# Patient Record
Sex: Male | Born: 1975
Health system: Southern US, Community
[De-identification: ages and names within clinical notes are randomized; demographics above are authoritative.]

## PROBLEM LIST (undated history)

## (undated) DIAGNOSIS — I499 Cardiac arrhythmia, unspecified: Secondary | ICD-10-CM

## (undated) DIAGNOSIS — E079 Disorder of thyroid, unspecified: Secondary | ICD-10-CM

## (undated) DIAGNOSIS — M1990 Unspecified osteoarthritis, unspecified site: Secondary | ICD-10-CM

## (undated) DIAGNOSIS — E039 Hypothyroidism, unspecified: Secondary | ICD-10-CM

## (undated) DIAGNOSIS — I429 Cardiomyopathy, unspecified: Secondary | ICD-10-CM

## (undated) DIAGNOSIS — I483 Typical atrial flutter: Secondary | ICD-10-CM

## (undated) DIAGNOSIS — I4819 Other persistent atrial fibrillation: Secondary | ICD-10-CM

## (undated) HISTORY — DX: Typical atrial flutter: I48.3

## (undated) HISTORY — DX: Cardiomyopathy, unspecified: I42.9

## (undated) HISTORY — DX: Other persistent atrial fibrillation: I48.19

## (undated) HISTORY — PX: NO PAST SURGERIES: SHX2092

---

## 2012-07-21 ENCOUNTER — Encounter (HOSPITAL_COMMUNITY): Payer: Self-pay | Admitting: *Deleted

## 2012-07-21 ENCOUNTER — Emergency Department (HOSPITAL_COMMUNITY)
Admission: EM | Admit: 2012-07-21 | Discharge: 2012-07-21 | Disposition: A | Discharge status: Home / Self Care | Payer: 59 | Attending: Emergency Medicine | Admitting: Emergency Medicine

## 2012-07-21 DIAGNOSIS — Y939 Activity, unspecified: Secondary | ICD-10-CM | POA: Insufficient documentation

## 2012-07-21 DIAGNOSIS — S025XXA Fracture of tooth (traumatic), initial encounter for closed fracture: Secondary | ICD-10-CM | POA: Insufficient documentation

## 2012-07-21 DIAGNOSIS — Y929 Unspecified place or not applicable: Secondary | ICD-10-CM | POA: Insufficient documentation

## 2012-07-21 DIAGNOSIS — E079 Disorder of thyroid, unspecified: Secondary | ICD-10-CM | POA: Insufficient documentation

## 2012-07-21 DIAGNOSIS — Z79899 Other long term (current) drug therapy: Secondary | ICD-10-CM | POA: Insufficient documentation

## 2012-07-21 DIAGNOSIS — S0993XA Unspecified injury of face, initial encounter: Secondary | ICD-10-CM

## 2012-07-21 DIAGNOSIS — X58XXXA Exposure to other specified factors, initial encounter: Secondary | ICD-10-CM | POA: Insufficient documentation

## 2012-07-21 HISTORY — DX: Disorder of thyroid, unspecified: E07.9

## 2012-07-21 NOTE — ED Notes (Signed)
Pt broke right front tooth today and now having mild pain. Airway intact

## 2012-07-28 NOTE — ED Provider Notes (Signed)
History     CSN: [id number]  Arrival date & time 07/21/12  1250   First MD Initiated Contact with Patient 07/21/12 1301      Chief Complaint  Patient presents with  . Dental Injury    (Consider location/radiation/quality/duration/timing/severity/associated sxs/prior treatment) Patient is a 37 y.o. male presenting with dental injury. The history is provided by the patient. No language interpreter was used.  Dental Injury This is a new problem. The current episode started today. Associated symptoms comments: The patient presents with complaint of fractured tooth. After arrival, Dr. Dwain Sarna arrived to see patient. History and decision making was made between Dr. Dwain Sarna and the patient..    Past Medical History  Diagnosis Date  . Thyroid disease     History reviewed. No pertinent past surgical history.  History reviewed. No pertinent family history.  History  Substance Use Topics  . Smoking status: Not on file  . Smokeless tobacco: Not on file  . Alcohol Use: Yes     Comment: occ      Review of Systems  Unable to perform ROS   Allergies  Review of patient's allergies indicates no known allergies.  Home Medications   Current Outpatient Rx  Name  Route  Sig  Dispense  Refill  . levothyroxine (SYNTHROID, LEVOTHROID) 50 MCG tablet   Oral   Take 50 mcg by mouth daily.         [name] (SYSTANE OP)   Ophthalmic   Apply 3-4 drops to eye daily.           BP 116/64  Pulse 59  Temp(Src) 97.9 F (36.6 C) (Oral)  Resp 18  SpO2 97%  Physical Exam  Constitutional: He is oriented to person, place, and time. He appears well-developed and well-nourished. No distress.  Neurological: He is alert and oriented to person, place, and time.    ED Course  Procedures (including critical care time)  Labs Reviewed - No data to display No results found.   1. Dental injury       MDM  The patient was not examined by me. Per Dr.  Dwain Sarna the patient's treatment options are non-emergent dental care only and patient agreed and wishes to be discharged.         Arnoldo Hooker, PA-C 07/28/12 [phone]

## 2012-07-30 NOTE — ED Provider Notes (Signed)
Medical screening examination/treatment/procedure(s) were performed by non-physician practitioner and as supervising physician I was immediately available for consultation/collaboration.   Elliott L Wentz, MD 07/30/12 0153 

## 2014-12-29 ENCOUNTER — Other Ambulatory Visit (HOSPITAL_COMMUNITY)
Admission: RE | Admit: 2014-12-29 | Discharge: 2014-12-29 | Disposition: A | Discharge status: Home / Self Care | Payer: BLUE CROSS/BLUE SHIELD | Source: Ambulatory Visit | Attending: Endocrinology | Admitting: Endocrinology

## 2014-12-29 DIAGNOSIS — E038 Other specified hypothyroidism: Secondary | ICD-10-CM | POA: Diagnosis present

## 2014-12-29 LAB — T4, FREE: FREE T4: 1.01 ng/dL (ref 0.61–1.12)

## 2014-12-29 LAB — TSH: TSH: 1.498 u[IU]/mL (ref 0.350–4.500)

## 2015-09-01 ENCOUNTER — Encounter (HOSPITAL_COMMUNITY): Payer: Self-pay | Admitting: Family Medicine

## 2015-09-01 ENCOUNTER — Emergency Department (HOSPITAL_COMMUNITY): Payer: BLUE CROSS/BLUE SHIELD

## 2015-09-01 ENCOUNTER — Emergency Department (HOSPITAL_COMMUNITY)
Admission: EM | Admit: 2015-09-01 | Discharge: 2015-09-01 | Disposition: A | Discharge status: Home / Self Care | Payer: BLUE CROSS/BLUE SHIELD | Attending: Emergency Medicine | Admitting: Emergency Medicine

## 2015-09-01 DIAGNOSIS — E079 Disorder of thyroid, unspecified: Secondary | ICD-10-CM | POA: Insufficient documentation

## 2015-09-01 DIAGNOSIS — Z79899 Other long term (current) drug therapy: Secondary | ICD-10-CM | POA: Insufficient documentation

## 2015-09-01 DIAGNOSIS — I4891 Unspecified atrial fibrillation: Secondary | ICD-10-CM | POA: Diagnosis not present

## 2015-09-01 DIAGNOSIS — R079 Chest pain, unspecified: Secondary | ICD-10-CM | POA: Diagnosis present

## 2015-09-01 LAB — CBC
HEMATOCRIT: 41.8 % (ref 39.0–52.0)
Hemoglobin: 14.1 g/dL (ref 13.0–17.0)
MCH: 29.9 pg (ref 26.0–34.0)
MCHC: 33.7 g/dL (ref 30.0–36.0)
MCV: 88.7 fL (ref 78.0–100.0)
PLATELETS: 316 10*3/uL (ref 150–400)
RBC: 4.71 MIL/uL (ref 4.22–5.81)
RDW: 12.5 % (ref 11.5–15.5)
WBC: 8.7 10*3/uL (ref 4.0–10.5)

## 2015-09-01 LAB — BASIC METABOLIC PANEL
Anion gap: 11 (ref 5–15)
BUN: 16 mg/dL (ref 6–20)
CHLORIDE: 103 mmol/L (ref 101–111)
CO2: 23 mmol/L (ref 22–32)
CREATININE: 1 mg/dL (ref 0.61–1.24)
Calcium: 9.6 mg/dL (ref 8.9–10.3)
GFR calc Af Amer: >60 mL/min (ref >60)
GFR calc non Af Amer: >60 mL/min (ref >60)
GLUCOSE: 99 mg/dL (ref 65–99)
POTASSIUM: 4.4 mmol/L (ref 3.5–5.1)
Sodium: 137 mmol/L (ref 135–145)

## 2015-09-01 LAB — I-STAT TROPONIN, ED: Troponin i, poc: 0 ng/mL (ref 0.00–0.08)

## 2015-09-01 LAB — T4, FREE: Free T4: 0.91 ng/dL (ref 0.61–1.12)

## 2015-09-01 LAB — TSH: TSH: 2.221 u[IU]/mL (ref 0.350–4.500)

## 2015-09-01 NOTE — ED Provider Notes (Signed)
CSN: [id number]     Arrival date & time 09/01/15  1155 History   First MD Initiated Contact with Patient 09/01/15 1236     Chief Complaint  Patient presents with  . Chest Pain     Patient is a 40 y.o. male presenting with chest pain.  Chest Pain Associated symptoms: no abdominal pain, no back pain, no fatigue and no fever   Patient presented with chest pain. States it began last night around bedtime with this morning. It was dull in his mid chest. No radiation. No diaphoresis. No lightheadedness or dizziness. He states his weekend he did have a fair amount of alcohol while he was up in [place] for a wedding, he also smoked a couple cigarettes. No known hypertension or coronary artery disease. no cardiac history. States he has a slight pain now. He is on thyroid medicine. No swelling in his legs.  Past Medical History  Diagnosis Date  . Thyroid disease    History reviewed. No pertinent past surgical history. History reviewed. No pertinent family history. Social History  Substance Use Topics  . Smoking status: Never Smoker   . Smokeless tobacco: None  . Alcohol Use: Yes     Comment: occ    Review of Systems  Constitutional: Negative for fever, appetite change and fatigue.  Respiratory: Negative for chest tightness.   Cardiovascular: Positive for chest pain.  Gastrointestinal: Negative for abdominal pain.  Genitourinary: Negative for genital sores.  Musculoskeletal: Negative for back pain.      Allergies  Review of patient's allergies indicates no known allergies.  Home Medications   Prior to Admission medications   Medication Sig Start Date End Date Taking? Authorizing Provider  levothyroxine (SYNTHROID, LEVOTHROID) 50 MCG tablet Take 50 mcg by mouth daily.    Historical Provider, MD  Polyethyl Glycol-Propyl Glycol (SYSTANE OP) Apply 3-4 drops to eye daily.    Historical Provider, MD   BP 128/90 mmHg  Pulse 65  Temp(Src) 97.8 F (36.6 C) (Oral)  Resp 18  Ht 5\' 11"   (1.803 m)  Wt 185 lb (83.915 kg)  BMI 25.81 kg/m2  SpO2 100% Physical Exam  Constitutional: He appears well-developed.  HENT:  Head: Atraumatic.  Neck: Neck supple.  Cardiovascular:  Irregular rhythm  Pulmonary/Chest: Effort normal.  Abdominal: Soft. There is no tenderness.  Musculoskeletal: Normal range of motion. He exhibits no edema.  Neurological: He is alert.  Skin: Skin is warm.    ED Course  Procedures (including critical care time) Labs Review Labs Reviewed  BASIC METABOLIC PANEL  CBC  TSH  T4, FREE  T3, FREE  I-STAT TROPOININ, ED    Imaging Review Dg Chest 2 View  09/01/2015  CLINICAL DATA:  Chest pain since yesterday EXAM: CHEST  2 VIEW COMPARISON:  None. FINDINGS: The heart size and mediastinal contours are within normal limits. Both lungs are clear. The visualized skeletal structures are unremarkable. IMPRESSION: No active cardiopulmonary disease. Electronically Signed   By: Charlett Nose M.D.   On: 09/01/2015 12:21   I have personally reviewed and evaluated these images and lab results as part of my medical decision-making.   EKG Interpretation   Date/Time:  Wednesday September 01 2015 12:02:03 EDT Ventricular Rate:  114 PR Interval:    QRS Duration: 86 QT Interval:  340 QTC Calculation: 468 R Axis:   89 Text Interpretation:  Atrial fibrillation with rapid ventricular response  Abnormal ECG Confirmed by Rubin Payor  MD, Lyndee Herbst [phone]) on 09/01/2015  12:49:21 PM  MDM   Final diagnoses:  Atrial fibrillation, unspecified type [facility])    Patient with chest pain. EKG and story not really worsened for ischemic cause, however found to be in new onset atrial fibrillation. Rate controlled in the ER here. Does have thyroid problems which have been monitored by Dr Altheimer. Discussed with Dr. [place]. Cardioversion a possibility but patient may resolve spontaneously especially since this could be a holiday heart with the recent alcohol intake. After discussion the  patient and he will just follow-up with the atrial fibrillation clinic. No anticoagulation at this time with Chadsvasc of 0. Will return for fast heart rate.    Benjiman Core, MD 09/01/15 1355

## 2015-09-01 NOTE — Discharge Instructions (Signed)

## 2015-09-01 NOTE — ED Notes (Signed)
Pt here for intermittent episodes of left sided chest pain that gets worse and better. sts worse when just sitting. sts he has been under some stress. Denies any strenuous activity.

## 2015-09-02 LAB — T3, FREE: T3, Free: 2.5 pg/mL (ref 2.0–4.4)

## 2015-09-20 ENCOUNTER — Encounter (HOSPITAL_COMMUNITY): Payer: Self-pay | Admitting: Nurse Practitioner

## 2015-09-20 ENCOUNTER — Ambulatory Visit (HOSPITAL_COMMUNITY)
Admission: RE | Admit: 2015-09-20 | Discharge: 2015-09-20 | Disposition: A | Discharge status: Home / Self Care | Payer: BLUE CROSS/BLUE SHIELD | Source: Ambulatory Visit | Attending: Nurse Practitioner | Admitting: Nurse Practitioner

## 2015-09-20 VITALS — BP 138/86 | HR 70 | Ht 71.0 in | Wt 186.6 lb

## 2015-09-20 DIAGNOSIS — Z79899 Other long term (current) drug therapy: Secondary | ICD-10-CM | POA: Insufficient documentation

## 2015-09-20 DIAGNOSIS — I480 Paroxysmal atrial fibrillation: Secondary | ICD-10-CM

## 2015-09-20 DIAGNOSIS — E079 Disorder of thyroid, unspecified: Secondary | ICD-10-CM | POA: Diagnosis not present

## 2015-09-20 DIAGNOSIS — I4891 Unspecified atrial fibrillation: Secondary | ICD-10-CM | POA: Insufficient documentation

## 2015-09-20 NOTE — Progress Notes (Signed)
Patient ID: Walter [name], male   DOB: 1975-07-29, 40 y.o.   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 He had recently attended a wedding in [place], drank more alcohol than usual and smoked a few cigarettes. He could feel the irregular heart beat initially. It was thought maybe the afib was secondary to holiday heart and  he would convert on his own. The pt has felt well and has ran 4 miles several times without issues but was surprised to find that EKG shows Afib rate controlled at 70 bpm. He does not feel the irregular heart beat. Denies any unusual chest pain, shortness of breath or fatigue. Chadsvasc score in 0. He does not have a family history. No unusual caffeine intake. More than 2 alcohol drinks a week. Denies sleep apnea.  He does have thyroid issues but thyroid panel checked in ER was normal.  Today, he denies symptoms of palpitations, chest pain, shortness of breath, orthopnea, PND, lower extremity edema, dizziness, presyncope, syncope, or neurologic sequela. The patient is tolerating medications without difficulties and is otherwise without complaint today.   Past Medical History  Diagnosis Date  . Thyroid disease    No past surgical history on file.  Current Outpatient Prescriptions  Medication Sig Dispense Refill  . levothyroxine (SYNTHROID, LEVOTHROID) 50 MCG tablet Take 50 mcg by mouth daily.    [name] (SYSTANE OP) Apply 3-4 drops to eye daily.     No current facility-administered medications for this encounter.    No Known Allergies  Social History   Social History  . Marital Status: Married    Spouse Name: N/A  . Number of Children: N/A  . Years of Education: N/A   Occupational History  . Not on file.   Social History Main Topics  . Smoking status: Never Smoker   .  Smokeless tobacco: Not on file  . Alcohol Use: Yes     Comment: occ  . Drug Use: No  . Sexual Activity: Not on file   Other Topics Concern  . Not on file   Social History Narrative    No family history on file.  ROS- All systems are reviewed and negative except as per the HPI above  Physical Exam: Filed Vitals:   09/20/15 0844  BP: 138/86  Pulse: 70  Height:  (1.803 m)  Weight: 186 lb 9.6 oz (84.641 kg)    GEN- The patient is well appearing, alert and oriented x 3 today.   Head- normocephalic, atraumatic Eyes-  Sclera clear, conjunctiva pink Ears- hearing intact Oropharynx- clear Neck- supple, no JVP Lymph- no cervical lymphadenopathy Lungs- Clear to ausculation bilaterally, normal work of breathing Heart-Irregular rate and rhythm, no murmurs, rubs or gallops, PMI not laterally displaced GI- soft, NT, ND, + BS Extremities- no clubbing, cyanosis, or edema MS- no significant deformity or atrophy Skin- no rash or lesion Psych- euthymic mood, full affect Neuro- strength and sensation are intact  EKG-afib at 70 bpm, qrs int 88 ms, qtc 423 ms with evidence for LVH  Epic records reviewed  Assessment and Plan: 1. Afib, asymptomatic Hard to call where it has been persistent or paroxymal  since 4/5   He was encouraged to buy a blood pressure cuff to see if persistent. If  persistent then will require to start a blood thinner with plans for cardioversion But first will plan for an echo and pt will contacted by phone results and necessary f/u. Chadsvasc score of 0  2. Lifestyle modification Limit alcohol to no more than 2 drinks a  week Currently is active and normal weight No symptoms of sleep apnea  F/u once echo results are known.

## 2015-09-24 ENCOUNTER — Ambulatory Visit (HOSPITAL_COMMUNITY)
Admission: RE | Admit: 2015-09-24 | Discharge: 2015-09-24 | Disposition: A | Discharge status: Home / Self Care | Payer: BLUE CROSS/BLUE SHIELD | Source: Ambulatory Visit | Attending: Nurse Practitioner | Admitting: Nurse Practitioner

## 2015-09-24 DIAGNOSIS — I4891 Unspecified atrial fibrillation: Secondary | ICD-10-CM | POA: Diagnosis present

## 2015-09-24 DIAGNOSIS — I517 Cardiomegaly: Secondary | ICD-10-CM | POA: Diagnosis not present

## 2015-09-24 DIAGNOSIS — I480 Paroxysmal atrial fibrillation: Secondary | ICD-10-CM | POA: Diagnosis not present

## 2015-09-24 NOTE — Progress Notes (Signed)
  Echocardiogram 2D Echocardiogram has been performed.  Walter [name] 09/24/2015, 3:56 PM

## 2015-09-28 ENCOUNTER — Telehealth (HOSPITAL_COMMUNITY): Payer: Self-pay | Admitting: Nurse Practitioner

## 2015-09-28 NOTE — Telephone Encounter (Signed)
Called pt to let him know of results of echo. He states that he feels great and does not believe he is in afib. I asked him to come by the office for EKG to confirm because if he still is in afib then really need to anticoagulate and cardiovert to restore SR. He assured me that he would do this soon. Echo suggests possible sleep apnea, he states that he does  snore but wife said no witnessed apnea episodes, but I suggested it still might be good to get a sleep study due to high correlation of afib and sleep apnea.

## 2015-09-29 ENCOUNTER — Ambulatory Visit (HOSPITAL_COMMUNITY)
Admission: RE | Admit: 2015-09-29 | Discharge: 2015-09-29 | Disposition: A | Discharge status: Home / Self Care | Payer: BLUE CROSS/BLUE SHIELD | Source: Ambulatory Visit | Attending: Nurse Practitioner | Admitting: Nurse Practitioner

## 2015-09-29 DIAGNOSIS — R001 Bradycardia, unspecified: Secondary | ICD-10-CM | POA: Diagnosis not present

## 2015-09-29 DIAGNOSIS — I4891 Unspecified atrial fibrillation: Secondary | ICD-10-CM | POA: Insufficient documentation

## 2015-09-29 DIAGNOSIS — R0683 Snoring: Secondary | ICD-10-CM | POA: Insufficient documentation

## 2015-09-29 DIAGNOSIS — I480 Paroxysmal atrial fibrillation: Secondary | ICD-10-CM

## 2015-09-29 NOTE — Progress Notes (Addendum)
Patient in for repeat EKG. Walter Coco NP to review EKG  Pt is in SR by today's EKG and states that he feels great. We discussed obtaining aq sleep study for snoring and mild structure changes and he will let me know if he wants to proceed. Offered 30 mg Cardizem as needed if he feels that he is in afib and he passes on it currently. Offered services of clinic at any time he feels that he is out of rhythm. He feels very well at present.

## 2016-09-05 DIAGNOSIS — R030 Elevated blood-pressure reading, without diagnosis of hypertension: Secondary | ICD-10-CM | POA: Diagnosis not present

## 2016-09-05 DIAGNOSIS — E039 Hypothyroidism, unspecified: Secondary | ICD-10-CM | POA: Diagnosis not present

## 2016-09-05 DIAGNOSIS — E063 Autoimmune thyroiditis: Secondary | ICD-10-CM | POA: Diagnosis not present

## 2016-09-05 DIAGNOSIS — E038 Other specified hypothyroidism: Secondary | ICD-10-CM | POA: Diagnosis not present

## 2016-09-05 DIAGNOSIS — E7800 Pure hypercholesterolemia, unspecified: Secondary | ICD-10-CM | POA: Diagnosis not present

## 2017-02-12 DIAGNOSIS — F4325 Adjustment disorder with mixed disturbance of emotions and conduct: Secondary | ICD-10-CM | POA: Diagnosis not present

## 2017-08-20 DIAGNOSIS — E038 Other specified hypothyroidism: Secondary | ICD-10-CM | POA: Diagnosis not present

## 2017-08-20 DIAGNOSIS — E063 Autoimmune thyroiditis: Secondary | ICD-10-CM | POA: Diagnosis not present

## 2017-08-20 DIAGNOSIS — R030 Elevated blood-pressure reading, without diagnosis of hypertension: Secondary | ICD-10-CM | POA: Diagnosis not present

## 2017-08-20 DIAGNOSIS — E7800 Pure hypercholesterolemia, unspecified: Secondary | ICD-10-CM | POA: Diagnosis not present

## 2017-08-21 ENCOUNTER — Telehealth: Payer: Self-pay

## 2017-08-21 NOTE — Telephone Encounter (Signed)
SENT REFERRAL TO SCHEDULING 

## 2017-08-22 ENCOUNTER — Telehealth: Payer: Self-pay | Admitting: Cardiology

## 2017-08-22 NOTE — Telephone Encounter (Signed)
error 

## 2017-08-22 NOTE — Telephone Encounter (Signed)
The first AM appt I see is not until 5/24 at 8 am.  Will ask Dr Anne Fu where he would like to schedule the patient.

## 2017-08-22 NOTE — Telephone Encounter (Signed)
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

## 2017-08-23 NOTE — Telephone Encounter (Signed)
Friday April 5, 8am double book ok.  Thanks  Donato Schultz, MD

## 2017-08-23 NOTE — Telephone Encounter (Signed)
Spoke with pt and scheduled him to see Dr. Anne Fu 4/5.  Pt in agreement with this plan.

## 2017-08-31 ENCOUNTER — Ambulatory Visit (INDEPENDENT_AMBULATORY_CARE_PROVIDER_SITE_OTHER): Payer: BLUE CROSS/BLUE SHIELD | Admitting: Cardiology

## 2017-08-31 ENCOUNTER — Encounter: Payer: Self-pay | Admitting: Cardiology

## 2017-08-31 ENCOUNTER — Encounter (INDEPENDENT_AMBULATORY_CARE_PROVIDER_SITE_OTHER): Payer: Self-pay

## 2017-08-31 VITALS — BP 138/80 | HR 120 | Ht 71.0 in | Wt 181.6 lb

## 2017-08-31 DIAGNOSIS — I480 Paroxysmal atrial fibrillation: Secondary | ICD-10-CM | POA: Diagnosis not present

## 2017-08-31 DIAGNOSIS — R000 Tachycardia, unspecified: Secondary | ICD-10-CM | POA: Diagnosis not present

## 2017-08-31 MED ORDER — DILTIAZEM HCL ER COATED BEADS 120 MG PO CP24: 120.0000 mg | ORAL_CAPSULE | Freq: Every day | ORAL | 6 refills | Status: DC

## 2017-08-31 NOTE — Progress Notes (Signed)
Cardiology Office Note:    Date:  08/31/2017   ID:  Jarmal Lewelling, DOB 1975/09/07, MRN [id number]  PCP:  Default, Provider, MD  Cardiologist:  No primary care provider on file.   Referring MD: Altheimer, Casimiro Needle, MD     History of Present Illness:    Walter [name] is a 42 y.o. male here for the evaluation of paroxysmal atrial fibrillation and hypertension at the request of Dr. Leslie Dales.  Back in 08/2015 had episode of atrial fibrillation after attending a wedding in [place]. Came back home, ran, and was surprised to find AFIB on ECG.  Converted on own back in 2017.  He is here again today with relatively no symptoms but his EKG is demonstrating atrial fibrillation with heart rates ranging between 110 and 120 bpm.  He thinks perhaps his endurance when running has decreased.  Perhaps these are his symptoms.  Overall he denies any chest pain fevers chills nausea vomiting syncope shortness of breath.  No bleeding issues.  CHADSVASC - 0  Has thyroid condition on synthroid controlled.   Denies snoring.    Past Medical History:  Diagnosis Date  . Thyroid disease     No past surgical history on file.  Current Medications: Current Meds  Medication Sig  . aspirin EC 81 MG tablet Take 1 tablet once a day  . levothyroxine (SYNTHROID) 112 MCG tablet Take 1 tablet by mouth once day in the AM on an empty stomach for thyroid (name brand only DAW)  . Multiple Vitamin (MULTIVITAMIN) capsule Take by mouth.  [name] (SYSTANE OP) Apply 3-4 drops to eye daily.     Allergies:   Patient has no known allergies.   Social History   Socioeconomic History  . Marital status: Married    Spouse name: Not on file  . Number of children: Not on file  . Years of education: Not on file  . Highest education level: Not on file  Occupational History  . Not on file  Social Needs  . Financial resource strain: Not on file  . Food insecurity:    Worry: Not on file    Inability: Not  on file  . Transportation needs:    Medical: Not on file    Non-medical: Not on file  Tobacco Use  . Smoking status: Never Smoker  Substance and Sexual Activity  . Alcohol use: Yes    Comment: occ  . Drug use: No  . Sexual activity: Not on file  Lifestyle  . Physical activity:    Days per week: Not on file    Minutes per session: Not on file  . Stress: Not on file  Relationships  . Social connections:    Talks on phone: Not on file    Gets together: Not on file    Attends religious service: Not on file    Active member of club or organization: Not on file    Attends meetings of clubs or organizations: Not on file    Relationship status: Not on file  Other Topics Concern  . Not on file  Social History Narrative  . Not on file     Family History: No early family history of CAD  ROS:   Please see the history of present illness.     All other systems reviewed and are negative.  EKGs/Labs/Other Studies Reviewed:    The following studies were reviewed today:  ECHO: 09/24/15 - Left ventricle: Wall thickness was increased in a pattern of  mild   LVH. Systolic function was normal. The estimated ejection   fraction was in the range of 50% to 55%. - Left atrium: The atrium was mildly dilated. - Right atrium: The atrium was moderately dilated.  EKG:  EKG is ordered today.  The ekg ordered today demonstrates 08/31/17-atrial fibrillation heart rate 120 bpm, LVH borderline.  Personally viewed.  In 09/20/15 - AFIB rate 70, borderline LVH.   Recent Labs: No results found for requested labs within last 8760 hours.  Recent Lipid Panel No results found for: CHOL, TRIG, HDL, CHOLHDL, VLDL, LDLCALC, LDLDIRECT     Physical Exam:    VS:  BP 138/80 (BP Location: Left Arm, Patient Position: Sitting, Cuff Size: Normal)   Pulse (!) 120   Ht 5\' 11"  (1.803 m)   Wt 181 lb 9.6 oz (82.4 kg)   BMI 25.33 kg/m     Wt Readings from Last 3 Encounters:  08/31/17 181 lb 9.6 oz (82.4 kg)    09/20/15 186 lb 9.6 oz (84.6 kg)  09/01/15 185 lb (83.9 kg)     GEN:  Well nourished, well developed in no acute distress HEENT: Normal NECK: No JVD; No carotid bruits LYMPHATICS: No lymphadenopathy CARDIAC: irreg irreg mildly tachy, no murmurs, rubs, gallops RESPIRATORY:  Clear to auscultation without rales, wheezing or rhonchi  ABDOMEN: Soft, non-tender, non-distended MUSCULOSKELETAL:  No edema; No deformity  SKIN: Warm and dry NEUROLOGIC:  Alert and oriented x 3 PSYCHIATRIC:  Normal affect   ASSESSMENT:    1. Increased heart rate   2. Paroxysmal atrial fibrillation (HCC)    PLAN:    In order of problems listed above:  Paroxysmal atrial fibrillation -Back in 2017 he auto converted.  Today he is in atrial fibrillation and really did not realize he was in it.  Perhaps decreased exercise tolerance noted when running. -We will start diltiazem CD 120 mill grams once a day.  Not only with his hopefully help with rate control but may help him auto convert.  We will see him back in the next week or so for follow-up.  If he is still in atrial fibrillation then, we have discussed starting Xarelto for 3 weeks cardioversion then continuing Xarelto for 4 weeks after cardioversion.  He understands.  I think given his age and mild decreased exercise tolerance, I would like to see him out of atrial fibrillation and try to maintain rhythm control.  Next steps may also be antiarrhythmics.  I once again expressed that we have the ability to utilize our atrial fibrillation clinic if necessary as well.  He has seen Rudi Coco, NP in the past. -He understands to use alcohol with moderation.  Make sure his thyroid is under good control. -I explained to him that aspirin therapy in the setting of atrial fibrillation has not been shown to be of significant benefit. CHADSVASC - 0    Medication Adjustments/Labs and Tests Ordered: Current medicines are reviewed at length with the patient today.   Concerns regarding medicines are outlined above.  Orders Placed This Encounter  Procedures  . EKG 12-Lead   Meds ordered this encounter  Medications  . diltiazem (CARDIZEM CD) 120 MG 24 hr capsule    Sig: Take 1 capsule (120 mg total) by mouth daily.    Dispense:  30 capsule    Refill:  6    Signed, Donato Schultz, MD  08/31/2017 10:24 AM    [facility]

## 2017-08-31 NOTE — Patient Instructions (Signed)
Medication Instructions:  Your physician has recommended you make the following change in your medication:  1. Start Cardizem CD  (120 mg ) daily   Labwork: -None  Testing/Procedures: -None   Follow-Up: Your physician recommends that you keep your scheduled  follow-up appointment with Dr. Anne Fu.    Any Other Special Instructions Will Be Listed Below (If Applicable).     If you need a refill on your cardiac medications before your next appointment, please call your pharmacy.

## 2017-09-10 ENCOUNTER — Encounter: Payer: Self-pay | Admitting: Cardiology

## 2017-09-10 ENCOUNTER — Ambulatory Visit (INDEPENDENT_AMBULATORY_CARE_PROVIDER_SITE_OTHER): Payer: BLUE CROSS/BLUE SHIELD | Admitting: Cardiology

## 2017-09-10 VITALS — BP 134/86 | HR 88 | Ht 71.0 in | Wt 179.8 lb

## 2017-09-10 DIAGNOSIS — I480 Paroxysmal atrial fibrillation: Secondary | ICD-10-CM | POA: Diagnosis not present

## 2017-09-10 MED ORDER — RIVAROXABAN 20 MG PO TABS: 20.0000 mg | ORAL_TABLET | Freq: Every day | ORAL | 11 refills | Status: DC

## 2017-09-10 MED ORDER — FLECAINIDE ACETATE 50 MG PO TABS: 50.0000 mg | ORAL_TABLET | Freq: Two times a day (BID) | ORAL | 6 refills | Status: DC

## 2017-09-10 MED ORDER — DILTIAZEM HCL ER COATED BEADS 240 MG PO CP24: 240.0000 mg | ORAL_CAPSULE | Freq: Every day | ORAL | 11 refills | Status: DC

## 2017-09-10 NOTE — Patient Instructions (Signed)
Medication Instructions:  Please discontinue your ASA. Increase Diltiazem to 240 mg a day. Start Xarelto 20 mg a day. Start Flecainide 50 mg twice a day. continue all other medications as listed.  Follow-Up: Follow up in 1 week with Rudi Coco in the [facility].  If you need a refill on your cardiac medications before your next appointment, please call your pharmacy.  Thank you for choosing [facility]!!

## 2017-09-10 NOTE — Progress Notes (Signed)
Cardiology Office Note:    Date:  09/10/2017   ID:  [name], DOB [birth year], MRN [id number]  PCP:  Default, Provider, MD  Cardiologist:  No primary care provider on file.   Referring MD: No ref. provider found     History of Present Illness:    Walter [name] is a 41 y.o. male here for the evaluation of paroxysmal atrial fibrillation and hypertension at the request of Dr. Leslie Dales.  Back in 08/2015 had episode of atrial fibrillation after attending a wedding in [place]. Came back home, ran, and was surprised to find AFIB on ECG.  Converted on own back in 2017.  He is here again today with relatively no symptoms but his EKG is demonstrating atrial fibrillation with heart rates ranging between 110 and 120 bpm.  He thinks perhaps his endurance when running has decreased.  Perhaps these are his symptoms.  Overall he denies any chest pain fevers chills nausea vomiting syncope shortness of breath.  No bleeding issues.  CHADSVASC - 0  Has thyroid condition on synthroid controlled.   Denies snoring.   09/10/17-he still remains in atrial fibrillation although rate is slightly improved around 100 today with diltiazem 120 CD.  We discussed the next steps and he would like to see if he could possibly convert chemically without cardioversion.  We will start flecainide as below.  Has had no history of coronary artery disease.  Past Medical History:  Diagnosis Date  . Thyroid disease     History reviewed. No pertinent surgical history.  Current Medications: Current Meds  Medication Sig  . diltiazem (CARDIZEM CD) 240 MG 24 hr capsule Take 1 capsule (240 mg total) by mouth daily.  Marland Kitchen levothyroxine (SYNTHROID) 112 MCG tablet Take 1 tablet by mouth once day in the AM on an empty stomach for thyroid (name brand only DAW)  . Multiple Vitamin (MULTIVITAMIN) capsule Take by mouth.  [name] (SYSTANE OP) Apply 3-4 drops to eye daily.  . [DISCONTINUED] aspirin EC 81 MG tablet  Take 1 tablet once a day  . [DISCONTINUED] diltiazem (CARDIZEM CD) 120 MG 24 hr capsule Take 1 capsule (120 mg total) by mouth daily.     Allergies:   Patient has no known allergies.   Social History   Socioeconomic History  . Marital status: Married    Spouse name: Not on file  . Number of children: Not on file  . Years of education: Not on file  . Highest education level: Not on file  Occupational History  . Not on file  Social Needs  . Financial resource strain: Not on file  . Food insecurity:    Worry: Not on file    Inability: Not on file  . Transportation needs:    Medical: Not on file    Non-medical: Not on file  Tobacco Use  . Smoking status: Never Smoker  . Smokeless tobacco: Never Used  Substance and Sexual Activity  . Alcohol use: Yes    Comment: occ  . Drug use: No  . Sexual activity: Not on file  Lifestyle  . Physical activity:    Days per week: Not on file    Minutes per session: Not on file  . Stress: Not on file  Relationships  . Social connections:    Talks on phone: Not on file    Gets together: Not on file    Attends religious service: Not on file    Active member of club or organization: Not  on file    Attends meetings of clubs or organizations: Not on file    Relationship status: Not on file  Other Topics Concern  . Not on file  Social History Narrative  . Not on file     Family History: No early family history of CAD  ROS:   Please see the history of present illness.     All other systems reviewed and are negative.  EKGs/Labs/Other Studies Reviewed:    The following studies were reviewed today:  ECHO: 09/24/15 - Left ventricle: Wall thickness was increased in a pattern of mild   LVH. Systolic function was normal. The estimated ejection   fraction was in the range of 50% to 55%. - Left atrium: The atrium was mildly dilated. - Right atrium: The atrium was moderately dilated.  EKG:  08/31/17-atrial fibrillation heart rate 120 bpm,  LVH borderline.  Personally viewed.  In 09/20/15 - AFIB rate 70, borderline LVH.   Recent Labs: No results found for requested labs within last 8760 hours.  Recent Lipid Panel No results found for: CHOL, TRIG, HDL, CHOLHDL, VLDL, LDLCALC, LDLDIRECT     Physical Exam:    VS:  BP 134/86   Pulse 88   Ht 5\' 11"  (1.803 m)   Wt 179 lb 12.8 oz (81.6 kg)   BMI 25.08 kg/m     Wt Readings from Last 3 Encounters:  09/10/17 179 lb 12.8 oz (81.6 kg)  08/31/17 181 lb 9.6 oz (82.4 kg)  09/20/15 186 lb 9.6 oz (84.6 kg)    GEN: Well nourished, well developed, in no acute distress  HEENT: normal  Neck: no JVD, carotid bruits, or masses Cardiac: irreg improved rate.  no murmurs, rubs, or gallops,no edema  Respiratory:  clear to auscultation bilaterally, normal work of breathing GI: soft, nontender, nondistended, + BS MS: no deformity or atrophy  Skin: warm and dry, no rash Neuro:  Alert and Oriented x 3, Strength and sensation are intact Psych: euthymic mood, full affect   ASSESSMENT:    1. Paroxysmal atrial fibrillation (HCC)    PLAN:    In order of problems listed above:  Paroxysmal atrial fibrillation -Back in 2017 he auto converted.  Now, he is continuing to show atrial fibrillation despite diltiazem 120 mg CD.  We will increase the diltiazem to 240 and I will add flecainide 50 mg twice a day.  We are starting Xarelto 20 mg once a day.  I will have him follow-up next week in the atrial fibrillation clinic with Rudi Coco.  If he is still in atrial fibrillation, we will increase his flecainide to 100 mg twice a day.  Also, we will arrange for cardioversion then for 3 weeks after 09/10/17.  If I am in the hospital, I will be happy to perform the cardioversion.  At our prior visit, he really did not realize he was in atrial fibrillation with rapid ventricular response in the 120s.  Today, he thinks that earlier this morning at a meeting he began to feel some palpitations.   Xarelto for  3 weeks prior to cardioversion is been started then continuing Xarelto for 4 weeks after cardioversion.  He understands.  I think given his age and mild decreased exercise tolerance, I would like to see him out of atrial fibrillation and try to maintain rhythm control.   He has seen Rudi Coco, NP in the past. -He understands to use alcohol with moderation.  Make sure his thyroid is under good control.  CHADSVASC - 0    Medication Adjustments/Labs and Tests Ordered: Current medicines are reviewed at length with the patient today.  Concerns regarding medicines are outlined above.  No orders of the defined types were placed in this encounter.  Meds ordered this encounter  Medications  . diltiazem (CARDIZEM CD) 240 MG 24 hr capsule    Sig: Take 1 capsule (240 mg total) by mouth daily.    Dispense:  30 capsule    Refill:  11  . rivaroxaban (XARELTO) 20 MG TABS tablet    Sig: Take 1 tablet (20 mg total) by mouth daily with supper.    Dispense:  30 tablet    Refill:  11  . flecainide (TAMBOCOR) 50 MG tablet    Sig: Take 1 tablet (50 mg total) by mouth 2 (two) times daily.    Dispense:  60 tablet    Refill:  6    Signed, Walter Schultz, MD  09/10/2017 4:52 PM    [facility]

## 2017-09-20 ENCOUNTER — Encounter (HOSPITAL_COMMUNITY): Payer: Self-pay | Admitting: Nurse Practitioner

## 2017-09-20 ENCOUNTER — Ambulatory Visit (HOSPITAL_COMMUNITY)
Admission: RE | Admit: 2017-09-20 | Discharge: 2017-09-20 | Disposition: A | Discharge status: Home / Self Care | Payer: BLUE CROSS/BLUE SHIELD | Source: Ambulatory Visit | Attending: Nurse Practitioner | Admitting: Nurse Practitioner

## 2017-09-20 ENCOUNTER — Ambulatory Visit (HOSPITAL_COMMUNITY): Payer: BLUE CROSS/BLUE SHIELD | Admitting: Nurse Practitioner

## 2017-09-20 VITALS — BP 134/86 | HR 141 | Ht 71.0 in | Wt 181.2 lb

## 2017-09-20 DIAGNOSIS — I4819 Other persistent atrial fibrillation: Secondary | ICD-10-CM

## 2017-09-20 DIAGNOSIS — Z7901 Long term (current) use of anticoagulants: Secondary | ICD-10-CM | POA: Diagnosis not present

## 2017-09-20 DIAGNOSIS — I481 Persistent atrial fibrillation: Secondary | ICD-10-CM | POA: Diagnosis not present

## 2017-09-20 DIAGNOSIS — I4892 Unspecified atrial flutter: Secondary | ICD-10-CM | POA: Insufficient documentation

## 2017-09-20 DIAGNOSIS — Z79899 Other long term (current) drug therapy: Secondary | ICD-10-CM | POA: Diagnosis not present

## 2017-09-20 DIAGNOSIS — I4891 Unspecified atrial fibrillation: Secondary | ICD-10-CM | POA: Diagnosis not present

## 2017-09-20 MED ORDER — METOPROLOL SUCCINATE ER 25 MG PO TB24: 25.0000 mg | ORAL_TABLET | Freq: Every day | ORAL | 3 refills | Status: DC

## 2017-09-20 MED ORDER — FLECAINIDE ACETATE 50 MG PO TABS: 100.0000 mg | ORAL_TABLET | Freq: Two times a day (BID) | ORAL | 6 refills | Status: DC

## 2017-09-20 NOTE — Progress Notes (Signed)
Primary Care Physician: Default, Provider, MD Referring Physician: Dr. [name] is a 42 y.o. male with a h/o paroxysmal afib. I saw him in April of 2017, for afib that started while he ws in a wedding in [place] but he self converted. He recently developed afib again and was evaluated by Dr. Anne Fu. He was started on xarelto 20 mg daily, Cardizem 120 mg a day and flecainide 50 mg bid. Was asked to  f/u in the clinic to adjust meds and ultimately arrange for cardioversion. He is asymptomatic but is running 140 bpm.  He has been on anticoagulation since 4/15.  Today, he denies symptoms of palpitations, chest pain, shortness of breath, orthopnea, PND, lower extremity edema, dizziness, presyncope, syncope, or neurologic sequela. The patient is tolerating medications without difficulties and is otherwise without complaint today.   Past Medical History:  Diagnosis Date  . Thyroid disease    No past surgical history on file.  Current Outpatient Medications  Medication Sig Dispense Refill  . diltiazem (CARDIZEM CD) 240 MG 24 hr capsule Take 1 capsule (240 mg total) by mouth daily. 30 capsule 11  . flecainide (TAMBOCOR) 50 MG tablet Take 2 tablets (100 mg total) by mouth 2 (two) times daily. 60 tablet 6  . levothyroxine (SYNTHROID) 112 MCG tablet Take 1 tablet by mouth once day in the AM on an empty stomach for thyroid (name brand only DAW)    . Multiple Vitamin (MULTIVITAMIN) capsule Take by mouth.    [name] (SYSTANE OP) Apply 3-4 drops to eye daily.    . rivaroxaban (XARELTO) 20 MG TABS tablet Take 1 tablet (20 mg total) by mouth daily with supper. 30 tablet 11  . metoprolol succinate (TOPROL XL) 25 MG 24 hr tablet Take 1 tablet (25 mg total) by mouth at bedtime. 30 tablet 3   No current facility-administered medications for this encounter.     No Known Allergies  Social History   Socioeconomic History  . Marital status: Married    Spouse name: Not  on file  . Number of children: Not on file  . Years of education: Not on file  . Highest education level: Not on file  Occupational History  . Not on file  Social Needs  . Financial resource strain: Not on file  . Food insecurity:    Worry: Not on file    Inability: Not on file  . Transportation needs:    Medical: Not on file    Non-medical: Not on file  Tobacco Use  . Smoking status: Never Smoker  . Smokeless tobacco: Never Used  Substance and Sexual Activity  . Alcohol use: Yes    Comment: occ  . Drug use: No  . Sexual activity: Not on file  Lifestyle  . Physical activity:    Days per week: Not on file    Minutes per session: Not on file  . Stress: Not on file  Relationships  . Social connections:    Talks on phone: Not on file    Gets together: Not on file    Attends religious service: Not on file    Active member of club or organization: Not on file    Attends meetings of clubs or organizations: Not on file    Relationship status: Not on file  . Intimate partner violence:    Fear of current or ex partner: Not on file    Emotionally abused: Not on file  Physically abused: Not on file    Forced sexual activity: Not on file  Other Topics Concern  . Not on file  Social History Narrative  . Not on file    No family history on file.  ROS- All systems are reviewed and negative except as per the HPI above  Physical Exam: Vitals:   09/20/17 0931  BP: 134/86  Pulse: (!) 141  Weight: 181 lb 3.2 oz (82.2 kg)  Height: 5\' 11"  (1.803 m)   Wt Readings from Last 3 Encounters:  09/20/17 181 lb 3.2 oz (82.2 kg)  09/10/17 179 lb 12.8 oz (81.6 kg)  08/31/17 181 lb 9.6 oz (82.4 kg)    Labs: Lab Results  Component Value Date   NA 137 09/01/2015   K 4.4 09/01/2015   CL 103 09/01/2015   CO2 23 09/01/2015   GLUCOSE 99 09/01/2015   BUN 16 09/01/2015   CREATININE 1.00 09/01/2015   CALCIUM 9.6 09/01/2015   No results found for: INR No results found for: CHOL,  HDL, LDLCALC, TRIG   GEN- The patient is well appearing, alert and oriented x 3 today.   Head- normocephalic, atraumatic Eyes-  Sclera clear, conjunctiva pink Ears- hearing intact Oropharynx- clear Neck- supple, no JVP Lymph- no cervical lymphadenopathy Lungs- Clear to ausculation bilaterally, normal work of breathing Heart- Rapid regular rate and rhythm, no murmurs, rubs or gallops, PMI not laterally displaced GI- soft, NT, ND, + BS Extremities- no clubbing, cyanosis, or edema MS- no significant deformity or atrophy Skin- no rash or lesion Psych- euthymic mood, full affect Neuro- strength and sensation are intact  EKG-atrial flutter at 141 ms, reads stemi, probably 2/2 flutter waves, does not fir clinical  picture Echo- 08/2015 Study Conclusions  - Left ventricle: Wall thickness was increased in a pattern of mild   LVH. Systolic function was normal. The estimated ejection   fraction was in the range of 50% to 55%. - Left atrium: The atrium was mildly dilated. - Right atrium: The atrium was moderately dilated.    Assessment and Plan: 1. Afib/flutter Still with RVR, although asymptomatic Will continue Cardizem 240 mg qd but add toprol xl 25 mg at hs for better rate control Increase flecainide to 100 mg bid Continue xarelto 20 mg daily  Will bring back on Monday and if continues to have RVR will consider sooner cardioversion with TEE Have asked him not to run with RVR Will need ETT on flecainide when he returns to rhythm   Lupita Leash C. [facility] [address] [place], [place] [address] [phone]

## 2017-09-20 NOTE — Patient Instructions (Addendum)
START metoprolol 25 mg, one tablet at bedtime  Increase Flecainide to 100 mg twice a day  Follow up with Lupita Leash on Monday and keep check on BPs and HR over the weekend

## 2017-09-24 ENCOUNTER — Ambulatory Visit (HOSPITAL_COMMUNITY)
Admission: RE | Admit: 2017-09-24 | Discharge: 2017-09-24 | Disposition: A | Discharge status: Home / Self Care | Payer: BLUE CROSS/BLUE SHIELD | Source: Ambulatory Visit | Attending: Nurse Practitioner | Admitting: Nurse Practitioner

## 2017-09-24 DIAGNOSIS — I447 Left bundle-branch block, unspecified: Secondary | ICD-10-CM | POA: Insufficient documentation

## 2017-09-24 DIAGNOSIS — I4581 Long QT syndrome: Secondary | ICD-10-CM | POA: Diagnosis not present

## 2017-09-24 DIAGNOSIS — I4892 Unspecified atrial flutter: Secondary | ICD-10-CM | POA: Diagnosis not present

## 2017-09-24 DIAGNOSIS — R000 Tachycardia, unspecified: Secondary | ICD-10-CM | POA: Insufficient documentation

## 2017-09-24 LAB — CBC
HEMATOCRIT: 40.5 % (ref 39.0–52.0)
Hemoglobin: 13.6 g/dL (ref 13.0–17.0)
MCH: 32.2 pg (ref 26.0–34.0)
MCHC: 33.6 g/dL (ref 30.0–36.0)
MCV: 96 fL (ref 78.0–100.0)
Platelets: 290 10*3/uL (ref 150–400)
RBC: 4.22 MIL/uL (ref 4.22–5.81)
RDW: 12.7 % (ref 11.5–15.5)
WBC: 6.1 10*3/uL (ref 4.0–10.5)

## 2017-09-24 LAB — BASIC METABOLIC PANEL
ANION GAP: 9 (ref 5–15)
BUN: 11 mg/dL (ref 6–20)
CALCIUM: 9.5 mg/dL (ref 8.9–10.3)
CO2: 30 mmol/L (ref 22–32)
CREATININE: 0.99 mg/dL (ref 0.61–1.24)
Chloride: 100 mmol/L — ABNORMAL LOW (ref 101–111)
Glucose, Bld: 100 mg/dL — ABNORMAL HIGH (ref 65–99)
Potassium: 4.4 mmol/L (ref 3.5–5.1)
Sodium: 139 mmol/L (ref 135–145)

## 2017-09-24 NOTE — Progress Notes (Addendum)
Pt in for repeat EKG since increasing flecainide and starting Metoprolol 25 mg QHS  Flecainide was increased to 100 mg bid and metoprolol ER started at 25 mg at HS and to  f/u EKG. He remains in atrial flutter with variable AV block  at 111 bpm, prior v rate 142 bpm..  He will be 21 days on anticoagulation on May 6th, when he is scheduled for cardioversion..No missed doses. He can tell he is not  at baseline but does not feel terrible either. He ran this weekend and did ok but felt more winded. I have asked him no to run until he gets back in rhythm. He is scheduled for bmet/cbc for the procedure. He states that his thyroid was checked in March and was in normal limits.

## 2017-09-24 NOTE — Patient Instructions (Addendum)
Cardioversion scheduled for Monday, May 6th  - Arrive at the [facility] and go to admitting at [facility] not eat or drink anything after midnight the night prior to your procedure.  - Take all your medication with a sip of water prior to arrival.  - You will not be able to drive home after your procedure.

## 2017-10-01 ENCOUNTER — Encounter (HOSPITAL_COMMUNITY)
Admission: RE | Disposition: A | Discharge status: Home / Self Care | Payer: Self-pay | Source: Ambulatory Visit | Attending: Cardiology

## 2017-10-01 ENCOUNTER — Encounter (HOSPITAL_COMMUNITY): Payer: Self-pay

## 2017-10-01 ENCOUNTER — Other Ambulatory Visit: Payer: Self-pay

## 2017-10-01 ENCOUNTER — Ambulatory Visit (HOSPITAL_COMMUNITY)
Admission: RE | Admit: 2017-10-01 | Discharge: 2017-10-01 | Disposition: A | Discharge status: Home / Self Care | Payer: BLUE CROSS/BLUE SHIELD | Source: Ambulatory Visit | Attending: Cardiology | Admitting: Cardiology

## 2017-10-01 ENCOUNTER — Ambulatory Visit (HOSPITAL_COMMUNITY): Payer: BLUE CROSS/BLUE SHIELD | Admitting: Certified Registered Nurse Anesthetist

## 2017-10-01 DIAGNOSIS — Z79890 Hormone replacement therapy (postmenopausal): Secondary | ICD-10-CM | POA: Insufficient documentation

## 2017-10-01 DIAGNOSIS — E079 Disorder of thyroid, unspecified: Secondary | ICD-10-CM | POA: Diagnosis not present

## 2017-10-01 DIAGNOSIS — Z7901 Long term (current) use of anticoagulants: Secondary | ICD-10-CM | POA: Diagnosis not present

## 2017-10-01 DIAGNOSIS — I483 Typical atrial flutter: Secondary | ICD-10-CM | POA: Insufficient documentation

## 2017-10-01 DIAGNOSIS — I4891 Unspecified atrial fibrillation: Secondary | ICD-10-CM | POA: Insufficient documentation

## 2017-10-01 DIAGNOSIS — Z79899 Other long term (current) drug therapy: Secondary | ICD-10-CM | POA: Insufficient documentation

## 2017-10-01 HISTORY — DX: Hypothyroidism, unspecified: E03.9

## 2017-10-01 HISTORY — PX: CARDIOVERSION: SHX1299

## 2017-10-01 SURGERY — CARDIOVERSION
Anesthesia: General

## 2017-10-01 MED ORDER — SODIUM CHLORIDE 0.9 % IV SOLN
INTRAVENOUS | Status: DC
Start: 2017-10-01 — End: 2017-10-01
  Administered 2017-10-01: 08:00:00 via INTRAVENOUS

## 2017-10-01 MED ORDER — PROPOFOL 10 MG/ML IV BOLUS
INTRAVENOUS | Status: DC | PRN
  Administered 2017-10-01: 140 mg via INTRAVENOUS

## 2017-10-01 MED ORDER — LIDOCAINE 2% (20 MG/ML) 5 ML SYRINGE
INTRAMUSCULAR | Status: DC | PRN
Start: 2017-10-01 — End: 2017-10-01
  Administered 2017-10-01: 60 mg via INTRAVENOUS

## 2017-10-01 NOTE — Anesthesia Postprocedure Evaluation (Signed)
Anesthesia Post Note  Patient: Walter [name]  Procedure(s) Performed: CARDIOVERSION (N/A )     Patient location during evaluation: PACU Anesthesia Type: General Level of consciousness: awake and alert Pain management: pain level controlled Vital Signs Assessment: post-procedure vital signs reviewed and stable Respiratory status: spontaneous breathing, nonlabored ventilation, respiratory function stable and patient connected to nasal cannula oxygen Cardiovascular status: blood pressure returned to baseline and stable Postop Assessment: no apparent nausea or vomiting Anesthetic complications: no    Last Vitals:  Vitals:   10/01/17 0820 10/01/17 0830  BP:    Pulse: 68 67  Resp: 17 (!) 22  Temp:    SpO2: 100% 100%    Last Pain:  Vitals:   10/01/17 0830  TempSrc:   PainSc: 0-No pain                 Taner Rzepka S

## 2017-10-01 NOTE — Transfer of Care (Signed)
Immediate Anesthesia Transfer of Care Note  Patient: Walter [name]  Procedure(s) Performed: CARDIOVERSION (N/A )  Patient Location: Endoscopy Unit  Anesthesia Type:General  Level of Consciousness: awake, alert  and oriented  Airway & Oxygen Therapy: Patient Spontanous Breathing  Post-op Assessment: Report given to RN  Post vital signs: Reviewed and stable  Last Vitals:  Vitals Value Taken Time  BP    Temp    Pulse    Resp    SpO2      Last Pain:  Vitals:   10/01/17 0721  TempSrc: Oral  PainSc: 0-No pain         Complications: No apparent anesthesia complications

## 2017-10-01 NOTE — Discharge Instructions (Signed)
Electrical Cardioversion, Care After °This sheet gives you information about how to care for yourself after your procedure. Your health care provider may also give you more specific instructions. If you have problems or questions, contact your health care provider. °What can I expect after the procedure? °After the procedure, it is common to have: °· Some redness on the skin where the shocks were given. ° °Follow these instructions at home: °· Do not drive for 24 hours if you were given a medicine to help you relax (sedative). °· Take over-the-counter and prescription medicines only as told by your health care provider. °· Ask your health care provider how to check your pulse. Check it often. °· Rest for 48 hours after the procedure or as told by your health care provider. °· Avoid or limit your caffeine use as told by your health care provider. °Contact a health care provider if: °· You feel like your heart is beating too quickly or your pulse is not regular. °· You have a serious muscle cramp that does not go away. °Get help right away if: °· You have discomfort in your chest. °· You are dizzy or you feel faint. °· You have trouble breathing or you are short of breath. °· Your speech is slurred. °· You have trouble moving an arm or leg on one side of your body. °· Your fingers or toes turn cold or blue. °This information is not intended to replace advice given to you by your health care provider. Make sure you discuss any questions you have with your health care provider. °Document Released: 03/05/2013 Document Revised: 12/17/2015 Document Reviewed: 11/19/2015 °Elsevier Interactive Patient Education © 2018 [employer] ° °

## 2017-10-01 NOTE — Interval H&P Note (Signed)
History and Physical Interval Note:  10/01/2017 7:47 AM  Walter [name]  has presented today for surgery, with the diagnosis of afib  The various methods of treatment have been discussed with the patient and family. After consideration of risks, benefits and other options for treatment, the patient has consented to  Procedure(s): CARDIOVERSION (N/A) as a surgical intervention .  The patient's history has been reviewed, patient examined, no change in status, stable for surgery.  I have reviewed the patient's chart and labs.  Questions were answered to the patient's satisfaction.     [name]

## 2017-10-01 NOTE — Anesthesia Preprocedure Evaluation (Signed)
Anesthesia Evaluation  Patient identified by MRN, date of birth, ID band Patient awake    Reviewed: Allergy & Precautions, NPO status , Patient's Chart, lab work & pertinent test results  Airway Mallampati: II  TM Distance: >3 FB Neck ROM: Full    Dental no notable dental hx.    Pulmonary neg pulmonary ROS,    Pulmonary exam normal breath sounds clear to auscultation       Cardiovascular + dysrhythmias Atrial Fibrillation  Rhythm:Irregular Rate:Normal     Neuro/Psych negative neurological ROS  negative psych ROS   GI/Hepatic negative GI ROS, Neg liver ROS,   Endo/Other  negative endocrine ROS  Renal/GU negative Renal ROS  negative genitourinary   Musculoskeletal negative musculoskeletal ROS (+)   Abdominal   Peds negative pediatric ROS (+)  Hematology negative hematology ROS (+)   Anesthesia Other Findings   Reproductive/Obstetrics negative OB ROS                             Anesthesia Physical Anesthesia Plan  ASA: III  Anesthesia Plan: General   Post-op Pain Management:    Induction: Intravenous  PONV Risk Score and Plan: 0  Airway Management Planned: Mask  Additional Equipment:   Intra-op Plan:   Post-operative Plan:   Informed Consent: I have reviewed the patients History and Physical, chart, labs and discussed the procedure including the risks, benefits and alternatives for the proposed anesthesia with the patient or authorized representative who has indicated his/her understanding and acceptance.   Dental advisory given  Plan Discussed with: CRNA and Surgeon  Anesthesia Plan Comments:         Anesthesia Quick Evaluation

## 2017-10-01 NOTE — H&P (Signed)
ATRIAL FIB OFFICE VISIT   09/20/2017 [facility]    Newman Nip, NP  Cardiology   Persistent atrial fibrillation [facility])  Dx   Atrial Fibrillation  Reason for Visit   Additional Documentation   Vitals:   BP 134/86 (BP Location: Right Arm, Patient Position: Sitting, Cuff Size: Normal)   Pulse 141    Ht 5\' 11"  (1.803 m)   Wt 181 lb 3.2 oz (82.2 kg)   BMI 25.27 kg/m   BSA 2.03 m      More Vitals   Flowsheets:   Anthropometrics,   MEWS Score     Encounter Info:   Billing Info,   History,   Allergies,   Detailed Report     All Notes   Progress Notes by Newman Nip, NP at 09/20/2017 1:35 PM  Author: Newman Nip, NP Author Type: Nurse Practitioner Filed: 09/20/2017 1:52 PM  Note Status: Signed Cosign: Cosign Not Required Date of Service: 09/20/2017 1:35 PM  Editor: Newman Nip, NP (Nurse Practitioner)  Expand All Collapse All     Primary Care Physician: Default, Provider, MD Referring Physician: Dr. Ted Mcalpine Lammert is a 42 y.o. male with a h/o paroxysmal afib. I saw him in April of 2017, for afib that started while he ws in a wedding in [place] but he self converted. He recently developed afib again and was evaluated by Dr. Anne Fu. He was started on xarelto 20 mg daily, Cardizem 120 mg a day and flecainide 50 mg bid. Was asked to  f/u in the clinic to adjust meds and ultimately arrange for cardioversion. He is asymptomatic but is running 140 bpm.  He has been on anticoagulation since 4/15.  Today, he denies symptoms of palpitations, chest pain, shortness of breath, orthopnea, PND, lower extremity edema, dizziness, presyncope, syncope, or neurologic sequela. The patient is tolerating medications without difficulties and is otherwise without complaint today.       Past Medical History:  Diagnosis Date  . Thyroid disease    No past surgical history on file.        Current Outpatient Medications  Medication Sig  Dispense Refill  . diltiazem (CARDIZEM CD) 240 MG 24 hr capsule Take 1 capsule (240 mg total) by mouth daily. 30 capsule 11  . flecainide (TAMBOCOR) 50 MG tablet Take 2 tablets (100 mg total) by mouth 2 (two) times daily. 60 tablet 6  . levothyroxine (SYNTHROID) 112 MCG tablet Take 1 tablet by mouth once day in the AM on an empty stomach for thyroid (name brand only DAW)    . Multiple Vitamin (MULTIVITAMIN) capsule Take by mouth.    [name] (SYSTANE OP) Apply 3-4 drops to eye daily.    . rivaroxaban (XARELTO) 20 MG TABS tablet Take 1 tablet (20 mg total) by mouth daily with supper. 30 tablet 11  . metoprolol succinate (TOPROL XL) 25 MG 24 hr tablet Take 1 tablet (25 mg total) by mouth at bedtime. 30 tablet 3   No current facility-administered medications for this encounter.     No Known Allergies  Social History        Socioeconomic History  . Marital status: Married    Spouse name: Not on file  . Number of children: Not on file  . Years of education: Not on file  . Highest education level: Not on file  Occupational History  . Not on file  Social Needs  . Physicist, medical  strain: Not on file  . Food insecurity:    Worry: Not on file    Inability: Not on file  . Transportation needs:    Medical: Not on file    Non-medical: Not on file  Tobacco Use  . Smoking status: Never Smoker  . Smokeless tobacco: Never Used  Substance and Sexual Activity  . Alcohol use: Yes    Comment: occ  . Drug use: No  . Sexual activity: Not on file  Lifestyle  . Physical activity:    Days per week: Not on file    Minutes per session: Not on file  . Stress: Not on file  Relationships  . Social connections:    Talks on phone: Not on file    Gets together: Not on file    Attends religious service: Not on file    Active member of club or organization: Not on file    Attends meetings of clubs or organizations: Not on file     Relationship status: Not on file  . Intimate partner violence:    Fear of current or ex partner: Not on file    Emotionally abused: Not on file    Physically abused: Not on file    Forced sexual activity: Not on file  Other Topics Concern  . Not on file  Social History Narrative  . Not on file    No family history on file.  ROS- All systems are reviewed and negative except as per the HPI above  Physical Exam:    Vitals:   09/20/17 0931  BP: 134/86  Pulse: (!) 141  Weight: 181 lb 3.2 oz (82.2 kg)  Height: 5\' 11"  (1.803 m)      Wt Readings from Last 3 Encounters:  09/20/17 181 lb 3.2 oz (82.2 kg)  09/10/17 179 lb 12.8 oz (81.6 kg)  08/31/17 181 lb 9.6 oz (82.4 kg)    Labs: Recent Labs       Lab Results  Component Value Date   NA 137 09/01/2015   K 4.4 09/01/2015   CL 103 09/01/2015   CO2 23 09/01/2015   GLUCOSE 99 09/01/2015   BUN 16 09/01/2015   CREATININE 1.00 09/01/2015   CALCIUM 9.6 09/01/2015     Recent Labs  No results found for: INR   Recent Labs  No results found for: CHOL, HDL, LDLCALC, TRIG     GEN- The patient is well appearing, alert and oriented x 3 today.   Head- normocephalic, atraumatic Eyes-  Sclera clear, conjunctiva pink Ears- hearing intact Oropharynx- clear Neck- supple, no JVP Lymph- no cervical lymphadenopathy Lungs- Clear to ausculation bilaterally, normal work of breathing Heart- Rapid regular rate and rhythm, no murmurs, rubs or gallops, PMI not laterally displaced GI- soft, NT, ND, + BS Extremities- no clubbing, cyanosis, or edema MS- no significant deformity or atrophy Skin- no rash or lesion Psych- euthymic mood, full affect Neuro- strength and sensation are intact  EKG-atrial flutter at 141 ms, reads stemi, probably 2/2 flutter waves, does not fir clinical  picture Echo- 08/2015 Study Conclusions  - Left ventricle: Wall thickness was increased in a pattern of mild LVH. Systolic  function was normal. The estimated ejection fraction was in the range of 50% to 55%. - Left atrium: The atrium was mildly dilated. - Right atrium: The atrium was moderately dilated.    Assessment and Plan: 1. Afib/flutter Still with RVR, although asymptomatic Will continue Cardizem 240 mg qd but add toprol xl 25  mg at hs for better rate control Increase flecainide to 100 mg bid Continue xarelto 20 mg daily  Will bring back on Monday and if continues to have RVR will consider sooner cardioversion with TEE Have asked him not to run with RVR Will need ETT on flecainide when he returns to rhythm   Lupita Leash C. Matthew Folks [facility] [address] [place], [place] [address] [phone]     For DCCV; pt compliant with xarelto. [name], MD

## 2017-10-01 NOTE — Procedures (Signed)
Electrical Cardioversion Procedure Note Walter [name] [id number] 04-16-1976  Procedure: Electrical Cardioversion Indications:  Atrial Flutter  Procedure Details Consent: Risks of procedure as well as the alternatives and risks of each were explained to the (patient/caregiver).  Consent for procedure obtained. Time Out: Verified patient identification, verified procedure, site/side was marked, verified correct patient position, special equipment/implants available, medications/allergies/relevent history reviewed, required imaging and test results available.  Performed  Patient placed on cardiac monitor, pulse oximetry, supplemental oxygen as necessary.  Sedation given: Pt sedated by anesthesia with lidocaine 60 mg and diprovan 140 mg IV. Pacer pads placed anterior and posterior chest.  Cardioverted 1 time(s).  Cardioverted at 120J.  Evaluation Findings: Post procedure EKG shows: Sinus bradycardia Complications: None Patient did tolerate procedure well.   Walter [name] 10/01/2017, 7:48 AM

## 2017-10-02 ENCOUNTER — Encounter (HOSPITAL_COMMUNITY): Payer: Self-pay | Admitting: Cardiology

## 2017-10-08 ENCOUNTER — Encounter (HOSPITAL_COMMUNITY): Payer: Self-pay | Admitting: Nurse Practitioner

## 2017-10-08 ENCOUNTER — Ambulatory Visit (HOSPITAL_COMMUNITY)
Admission: RE | Admit: 2017-10-08 | Discharge: 2017-10-08 | Disposition: A | Discharge status: Home / Self Care | Payer: BLUE CROSS/BLUE SHIELD | Source: Ambulatory Visit | Attending: Nurse Practitioner | Admitting: Nurse Practitioner

## 2017-10-08 VITALS — BP 136/84 | HR 51 | Ht 71.0 in | Wt 184.0 lb

## 2017-10-08 DIAGNOSIS — Z79899 Other long term (current) drug therapy: Secondary | ICD-10-CM | POA: Diagnosis not present

## 2017-10-08 DIAGNOSIS — E039 Hypothyroidism, unspecified: Secondary | ICD-10-CM | POA: Diagnosis not present

## 2017-10-08 DIAGNOSIS — Z7901 Long term (current) use of anticoagulants: Secondary | ICD-10-CM | POA: Diagnosis not present

## 2017-10-08 DIAGNOSIS — Z79890 Hormone replacement therapy (postmenopausal): Secondary | ICD-10-CM | POA: Insufficient documentation

## 2017-10-08 DIAGNOSIS — I481 Persistent atrial fibrillation: Secondary | ICD-10-CM

## 2017-10-08 DIAGNOSIS — I480 Paroxysmal atrial fibrillation: Secondary | ICD-10-CM | POA: Insufficient documentation

## 2017-10-08 DIAGNOSIS — I4892 Unspecified atrial flutter: Secondary | ICD-10-CM | POA: Insufficient documentation

## 2017-10-08 DIAGNOSIS — I4819 Other persistent atrial fibrillation: Secondary | ICD-10-CM

## 2017-10-08 MED ORDER — RIVAROXABAN 20 MG PO TABS: 20.0000 mg | ORAL_TABLET | Freq: Every day | ORAL | 6 refills | Status: DC

## 2017-10-08 NOTE — Progress Notes (Signed)
Primary Care Physician: Default, Provider, MD Referring Physician: Dr. [name] is a 42 y.o. male with a h/o paroxysmal afib. I saw him in April of 2017, for afib that started while he ws in a wedding in [place] but he self converted. He recently developed afib again and was evaluated by Dr. Anne [name]. He was started on xarelto 20 mg daily, Cardizem 120 mg a day and flecainide 50 mg bid. Was asked to  f/u in the clinic to adjust meds and ultimately arrange for cardioversion. He is asymptomatic but is running 140 bpm.  He has been on anticoagulation since 4/15. Flecainide was increased to 100 mg bid and he was scheduled for cardioversion.   He returns today 5/13, after successful cardioversion. He feels well. He has returned to running and felt great. He will need f/u ETT with flecainide on board, especially since he is a runner.   Today, he denies symptoms of palpitations, chest pain, shortness of breath, orthopnea, PND, lower extremity edema, dizziness, presyncope, syncope, or neurologic sequela. The patient is tolerating medications without difficulties and is otherwise without complaint today.   Past Medical History:  Diagnosis Date  . Hypothyroidism   . Thyroid disease    Past Surgical History:  Procedure Laterality Date  . CARDIOVERSION N/A 10/01/2017   Procedure: CARDIOVERSION;  Surgeon: Walter Bunting, MD;  Location: [facility] ENDOSCOPY;  Service: Cardiovascular;  Laterality: N/A;    Current Outpatient Medications  Medication Sig Dispense Refill  . diltiazem (CARDIZEM CD) 240 MG 24 hr capsule Take 1 capsule (240 mg total) by mouth daily. 30 capsule 11  . flecainide (TAMBOCOR) 50 MG tablet Take 2 tablets (100 mg total) by mouth 2 (two) times daily. 60 tablet 6  . levothyroxine (SYNTHROID) 112 MCG tablet Take 1 tablet by mouth once day in the AM on an empty stomach for thyroid (name brand only DAW)    . metoprolol succinate (TOPROL XL) 25 MG 24 hr tablet Take 1 tablet (25 mg  total) by mouth at bedtime. 30 tablet 3  . Multiple Vitamin (MULTIVITAMIN) capsule Take 1 capsule by mouth every evening.     Walter [name] (SYSTANE OP) Place 4-5 drops into both eyes 2 (two) times daily.     . rivaroxaban (XARELTO) 20 MG TABS tablet Take 1 tablet (20 mg total) by mouth daily with supper. 30 tablet 6   No current facility-administered medications for this encounter.     No Known Allergies  Social History   Socioeconomic History  . Marital status: Married    Spouse name: Not on file  . Number of children: Not on file  . Years of education: Not on file  . Highest education level: Not on file  Occupational History  . Not on file  Social Needs  . Financial resource strain: Not on file  . Food insecurity:    Worry: Not on file    Inability: Not on file  . Transportation needs:    Medical: Not on file    Non-medical: Not on file  Tobacco Use  . Smoking status: Never Smoker  . Smokeless tobacco: Never Used  Substance and Sexual Activity  . Alcohol use: Yes    Comment: occ  . Drug use: No  . Sexual activity: Not on file  Lifestyle  . Physical activity:    Days per week: Not on file    Minutes per session: Not on file  . Stress: Not on file  Relationships  . Social connections:    Talks on phone: Not on file    Gets together: Not on file    Attends religious service: Not on file    Active member of club or organization: Not on file    Attends meetings of clubs or organizations: Not on file    Relationship status: Not on file  . Intimate partner violence:    Fear of current or ex partner: Not on file    Emotionally abused: Not on file    Physically abused: Not on file    Forced sexual activity: Not on file  Other Topics Concern  . Not on file  Social History Narrative  . Not on file    No family history on file.  ROS- All systems are reviewed and negative except as per the HPI above  Physical Exam: Vitals:   10/08/17 1129    BP: 136/84  Pulse: (!) 51  Weight: 184 lb (83.5 kg)  Height: 5\' 11"  (1.803 m)   Wt Readings from Last 3 Encounters:  10/08/17 184 lb (83.5 kg)  10/01/17 181 lb (82.1 kg)  09/20/17 181 lb 3.2 oz (82.2 kg)    Labs: Lab Results  Component Value Date   NA 139 09/24/2017   K 4.4 09/24/2017   CL 100 (L) 09/24/2017   CO2 30 09/24/2017   GLUCOSE 100 (H) 09/24/2017   BUN 11 09/24/2017   CREATININE 0.99 09/24/2017   CALCIUM 9.5 09/24/2017   No results found for: INR No results found for: CHOL, HDL, LDLCALC, TRIG   GEN- The patient is well appearing, alert and oriented x 3 today.   Head- normocephalic, atraumatic Eyes-  Sclera clear, conjunctiva pink Ears- hearing intact Oropharynx- clear Neck- supple, no JVP Lymph- no cervical lymphadenopathy Lungs- Clear to ausculation bilaterally, normal work of breathing Heart- Rapid regular rate and rhythm, no murmurs, rubs or gallops, PMI not laterally displaced GI- soft, NT, ND, + BS Extremities- no clubbing, cyanosis, or edema MS- no significant deformity or atrophy Skin- no rash or lesion Psych- euthymic mood, full affect Neuro- strength and sensation are intact  EKG-atrial flutter at 141 ms, reads stemi, probably 2/2 flutter waves, does not fir clinical  picture Echo- 08/2015 Study Conclusions  - Left ventricle: Wall thickness was increased in a pattern of mild   LVH. Systolic function was normal. The estimated ejection   fraction was in the range of 50% to 55%. - Left atrium: The atrium was mildly dilated. - Right atrium: The atrium was moderately dilated.    Assessment and Plan: 1. Afib/flutter Successful cardioversion Will continue Cardizem 240 mg qd but reduce  toprol xl 25 mg, to 1/2 tab qd for one week and then stop since HR is low 50's, asymptomatic. Conintue flecainide  100 mg bid Continue xarelto 20 mg daily for 4 weeks after cardioversion and can then stop for chadsvasc score of 0   I discussed with him to  consider an ablation in the future, especially if he has breakthrough afib on flecainide ETT pending  F/u with Dr. Anne [name] in one month afib clinic as needed  Walter [name] [facility] [address] [place], [place] [address] [phone]

## 2017-10-08 NOTE — Patient Instructions (Signed)
Stay on Xarelto for 1 month then stop  Reduce metoprolol to 1/2 tablet for 1 week then stop  Scheduling will be in touch with you to schedule exercise testing on flecainide and appointment with Dr. Anne Fu in 1 month

## 2017-10-27 ENCOUNTER — Telehealth: Payer: Self-pay | Admitting: Cardiology

## 2017-10-27 MED ORDER — FLECAINIDE ACETATE 100 MG PO TABS: 100.0000 mg | ORAL_TABLET | Freq: Two times a day (BID) | ORAL | 12 refills | Status: DC

## 2017-10-27 NOTE — Telephone Encounter (Signed)
Sending in flecanide 100 mg twice a day to [facility]  Donato Schultz, MD

## 2017-11-05 ENCOUNTER — Ambulatory Visit (INDEPENDENT_AMBULATORY_CARE_PROVIDER_SITE_OTHER): Payer: BLUE CROSS/BLUE SHIELD

## 2017-11-05 ENCOUNTER — Other Ambulatory Visit (HOSPITAL_COMMUNITY): Payer: Self-pay | Admitting: Nurse Practitioner

## 2017-11-05 DIAGNOSIS — Z79899 Other long term (current) drug therapy: Secondary | ICD-10-CM

## 2017-11-05 DIAGNOSIS — Z5181 Encounter for therapeutic drug level monitoring: Secondary | ICD-10-CM

## 2017-11-05 DIAGNOSIS — I4819 Other persistent atrial fibrillation: Secondary | ICD-10-CM

## 2017-11-05 DIAGNOSIS — I481 Persistent atrial fibrillation: Secondary | ICD-10-CM | POA: Diagnosis not present

## 2017-11-18 ENCOUNTER — Other Ambulatory Visit (HOSPITAL_COMMUNITY): Payer: Self-pay | Admitting: Nurse Practitioner

## 2017-11-19 ENCOUNTER — Encounter (INDEPENDENT_AMBULATORY_CARE_PROVIDER_SITE_OTHER): Payer: Self-pay

## 2017-11-19 ENCOUNTER — Encounter: Payer: Self-pay | Admitting: Cardiology

## 2017-11-19 ENCOUNTER — Ambulatory Visit (INDEPENDENT_AMBULATORY_CARE_PROVIDER_SITE_OTHER): Payer: BLUE CROSS/BLUE SHIELD | Admitting: Cardiology

## 2017-11-19 VITALS — BP 116/70 | HR 56 | Ht 71.0 in | Wt 177.4 lb

## 2017-11-19 DIAGNOSIS — I480 Paroxysmal atrial fibrillation: Secondary | ICD-10-CM

## 2017-11-19 LAB — EXERCISE TOLERANCE TEST
CSEPED: 13 min
CSEPEW: 15.7 METS
CSEPPHR: 151 {beats}/min
Exercise duration (sec): 15 s
MPHR: 176 {beats}/min
Percent HR: 86 %
RPE: 16
Rest HR: 56 {beats}/min

## 2017-11-19 NOTE — Progress Notes (Signed)
Cardiology Office Note:    Date:  11/19/2017   ID:  Jonathen Rathman, DOB 09/15/1975, MRN [id number]  PCP:  Default, Provider, MD  Cardiologist:  No primary care provider on file.   Referring MD: No ref. provider found     History of Present Illness:    Walter [name] is a 42 y.o. male here for the evaluation of paroxysmal atrial fibrillation and hypertension at the request of Dr. Leslie Dales.  Back in 08/2015 had episode of atrial fibrillation after attending a wedding in [place]. Came back home, ran, and was surprised to find AFIB on ECG.  Converted on own back in 2017.  He is here again today with relatively no symptoms but his EKG is demonstrating atrial fibrillation with heart rates ranging between 110 and 120 bpm.  He thinks perhaps his endurance when running has decreased.  Perhaps these are his symptoms.  Overall he denies any chest pain fevers chills nausea vomiting syncope shortness of breath.  No bleeding issues.  CHADSVASC - 0  Has thyroid condition on synthroid controlled.   Denies snoring.   09/10/17-he still remains in atrial fibrillation although rate is slightly improved around 100 today with diltiazem 120 CD.  We discussed the next steps and he would like to see if he could possibly convert chemically without cardioversion.  We will start flecainide as below.  Has had no history of coronary artery disease.  11/19/2017-cardioversion took place on 10/01/2017.  Successful.  On flecainide 100 mg twice a day.  Treadmill subsequently was reassuring no QRS widening, no arrhythmias.  Feeling well.  No chest pain fevers chills nausea vomiting syncope bleeding.  He is now off of Xarelto.  Past Medical History:  Diagnosis Date  . Hypothyroidism   . Thyroid disease     Past Surgical History:  Procedure Laterality Date  . CARDIOVERSION N/A 10/01/2017   Procedure: CARDIOVERSION;  Surgeon: Lewayne Bunting, MD;  Location: [facility] ENDOSCOPY;  Service: Cardiovascular;  Laterality: N/A;     Current Medications: Current Meds  Medication Sig  . diltiazem (CARDIZEM CD) 240 MG 24 hr capsule Take 1 capsule (240 mg total) by mouth daily.  . flecainide (TAMBOCOR) 100 MG tablet Take 1 tablet (100 mg total) by mouth 2 (two) times daily.  Marland Kitchen levothyroxine (SYNTHROID) 112 MCG tablet Take 1 tablet by mouth once day in the AM on an empty stomach for thyroid (name brand only DAW)  . metoprolol succinate (TOPROL XL) 25 MG 24 hr tablet Take 1 tablet (25 mg total) by mouth at bedtime.  . Multiple Vitamin (MULTIVITAMIN) capsule Take 1 capsule by mouth every evening.   [name] (SYSTANE OP) Place 4-5 drops into both eyes 2 (two) times daily.      Allergies:   Patient has no known allergies.   Social History   Socioeconomic History  . Marital status: Married    Spouse name: Not on file  . Number of children: Not on file  . Years of education: Not on file  . Highest education level: Not on file  Occupational History  . Not on file  Social Needs  . Financial resource strain: Not on file  . Food insecurity:    Worry: Not on file    Inability: Not on file  . Transportation needs:    Medical: Not on file    Non-medical: Not on file  Tobacco Use  . Smoking status: Never Smoker  . Smokeless tobacco: Never Used  Substance and Sexual Activity  .  Alcohol use: Yes    Comment: occ  . Drug use: No  . Sexual activity: Not on file  Lifestyle  . Physical activity:    Days per week: Not on file    Minutes per session: Not on file  . Stress: Not on file  Relationships  . Social connections:    Talks on phone: Not on file    Gets together: Not on file    Attends religious service: Not on file    Active member of club or organization: Not on file    Attends meetings of clubs or organizations: Not on file    Relationship status: Not on file  Other Topics Concern  . Not on file  Social History Narrative  . Not on file     Family History: No early family  history of CAD  ROS:   Please see the history of present illness.    All other ROS neg.  EKGs/Labs/Other Studies Reviewed:    The following studies were reviewed today:  ECHO: 09/24/15 - Left ventricle: Wall thickness was increased in a pattern of mild   LVH. Systolic function was normal. The estimated ejection   fraction was in the range of 50% to 55%. - Left atrium: The atrium was mildly dilated. - Right atrium: The atrium was moderately dilated.  ETT 10/2017-reassuring  EKG:  08/31/17-atrial fibrillation heart rate 120 bpm, LVH borderline.  Personally viewed.  In 09/20/15 - AFIB rate 70, borderline LVH.   Recent Labs: 09/24/2017: BUN 11; Creatinine, Ser 0.99; Hemoglobin 13.6; Platelets 290; Potassium 4.4; Sodium 139  Recent Lipid Panel No results found for: CHOL, TRIG, HDL, CHOLHDL, VLDL, LDLCALC, LDLDIRECT     Physical Exam:    VS:  BP 116/70   Pulse (!) 56   Ht 5\' 11"  (1.803 m)   Wt 177 lb 6.4 oz (80.5 kg)   SpO2 96%   BMI 24.74 kg/m     Wt Readings from Last 3 Encounters:  11/19/17 177 lb 6.4 oz (80.5 kg)  10/08/17 184 lb (83.5 kg)  10/01/17 181 lb (82.1 kg)    GEN: Well nourished, well developed, in no acute distress  HEENT: normal  Neck: no JVD, carotid bruits, or masses Cardiac: RRR; no murmurs, rubs, or gallops,no edema  Respiratory:  clear to auscultation bilaterally, normal work of breathing GI: soft, nontender, nondistended, + BS MS: no deformity or atrophy  Skin: warm and dry, no rash Neuro:  Alert and Oriented x 3, Strength and sensation are intact Psych: euthymic mood, full affect    ASSESSMENT:    1. Paroxysmal atrial fibrillation (HCC)    PLAN:    In order of problems listed above:  Paroxysmal atrial fibrillation -Back in 2017 he auto converted.  Most recent episode in 2019 however required cardioversion as well as antiarrhythmic flecanide, currently 100 mg twice a day.  Doing well.  Treadmill performed and reassuring.  Okay to continue  with exercise.  Continue with current care.  Xarelto was continued for 4 weeks after cardioversion.  Consider ablation in the future if this does not maintain. -He understands to use alcohol with moderation.  Make sure his thyroid is under good control. CHADSVASC - 0.  No long-term needs for anticoagulation.  Hypothyroidism -Continue with Synthroid.  We will see him back in 6 months.  He knows to call if any difficulties occur.    Medication Adjustments/Labs and Tests Ordered: Current medicines are reviewed at length with the patient today.  Concerns  regarding medicines are outlined above.  No orders of the defined types were placed in this encounter.  No orders of the defined types were placed in this encounter.   Signed, Donato Schultz, MD  11/19/2017 8:40 AM    [facility]

## 2017-11-19 NOTE — Patient Instructions (Signed)

## 2018-02-20 ENCOUNTER — Other Ambulatory Visit: Payer: Self-pay | Admitting: Cardiology

## 2018-05-21 ENCOUNTER — Telehealth: Payer: Self-pay | Admitting: Cardiology

## 2018-05-21 MED ORDER — DILTIAZEM HCL ER COATED BEADS 240 MG PO CP24: 240.0000 mg | ORAL_CAPSULE | Freq: Every day | ORAL | 11 refills | Status: DC

## 2018-05-21 MED ORDER — FLECAINIDE ACETATE 100 MG PO TABS: 100.0000 mg | ORAL_TABLET | Freq: Two times a day (BID) | ORAL | 3 refills | Status: DC

## 2018-05-21 NOTE — Telephone Encounter (Signed)
Sent in refill on meds. Spoke to him on phone.  Donato Schultz, MD

## 2018-10-08 ENCOUNTER — Other Ambulatory Visit: Payer: Self-pay

## 2018-10-08 ENCOUNTER — Ambulatory Visit (INDEPENDENT_AMBULATORY_CARE_PROVIDER_SITE_OTHER): Payer: BLUE CROSS/BLUE SHIELD

## 2018-10-08 ENCOUNTER — Ambulatory Visit (INDEPENDENT_AMBULATORY_CARE_PROVIDER_SITE_OTHER): Payer: BLUE CROSS/BLUE SHIELD | Admitting: Sports Medicine

## 2018-10-08 ENCOUNTER — Encounter: Payer: Self-pay | Admitting: Sports Medicine

## 2018-10-08 DIAGNOSIS — M1612 Unilateral primary osteoarthritis, left hip: Secondary | ICD-10-CM

## 2018-10-08 DIAGNOSIS — I10 Essential (primary) hypertension: Secondary | ICD-10-CM | POA: Diagnosis not present

## 2018-10-08 DIAGNOSIS — I4891 Unspecified atrial fibrillation: Secondary | ICD-10-CM | POA: Insufficient documentation

## 2018-10-08 DIAGNOSIS — M25552 Pain in left hip: Secondary | ICD-10-CM

## 2018-10-08 DIAGNOSIS — E039 Hypothyroidism, unspecified: Secondary | ICD-10-CM | POA: Insufficient documentation

## 2018-10-08 DIAGNOSIS — I480 Paroxysmal atrial fibrillation: Secondary | ICD-10-CM | POA: Diagnosis not present

## 2018-10-08 MED ORDER — AMLODIPINE BESYLATE 5 MG PO TABS: 5.0000 mg | ORAL_TABLET | Freq: Every day | ORAL | 3 refills | Status: DC

## 2018-10-08 NOTE — Assessment & Plan Note (Signed)
Rhythm controlled with flecainide and diltiazem

## 2018-10-08 NOTE — Assessment & Plan Note (Signed)
Start conservative, naproxen, rehab exercises. Return to see me in 4 to 8 weeks.

## 2018-10-08 NOTE — Assessment & Plan Note (Signed)
Amlodipine 5 mg daily. 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

## 2018-10-08 NOTE — Progress Notes (Signed)
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  Worse with weightbearing with moderate gelling, no back pain, no bowel or bladder dysfunction, nothing radiating down past the knee.  He has tried a bit of naproxen with good results.  Moderate, persistent, localized.  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  No headaches, visual changes, chest pain.  I reviewed the past medical history, family history, social history, surgical history, and allergies today and no changes were needed.  Please see the problem list section below in epic for further details.  Past Medical History: Past Medical History:  Diagnosis Date  . Hypothyroidism   . Thyroid disease    Past Surgical History: Past Surgical History:  Procedure Laterality Date  . CARDIOVERSION N/A 10/01/2017   Procedure: CARDIOVERSION;  Surgeon: Lewayne Bunting, MD;  Location: [facility] ENDOSCOPY;  Service: Cardiovascular;  Laterality: N/A;   Social History: Social History   Socioeconomic History  . Marital status: Married    Spouse name: Not on file  . Number of children: Not on file  . Years of education: Not on file  . Highest education level: Not on file  Occupational History  . Not on file  Social Needs  . Financial resource strain: Not on file  . Food insecurity:    Worry: Not on file    Inability: Not on file  . Transportation needs:    Medical: Not on file    Non-medical: Not on file  Tobacco Use  . Smoking status: Never Smoker  . Smokeless tobacco: Never Used  Substance and Sexual Activity  . Alcohol use: Yes    Comment: occ  . Drug use: No  . Sexual activity: Not on file  Lifestyle  . Physical activity:    Days per week: Not on file    Minutes per session: Not on file  . Stress: Not on file   Relationships  . Social connections:    Talks on phone: Not on file    Gets together: Not on file    Attends religious service: Not on file    Active member of club or organization: Not on file    Attends meetings of clubs or organizations: Not on file    Relationship status: Not on file  Other Topics Concern  . Not on file  Social History Narrative  . Not on file   Family History: No family history on file. Allergies: No Known Allergies Medications: See med rec.  Review of Systems: No headache, visual changes, nausea, vomiting, diarrhea, constipation, dizziness, abdominal pain, skin rash, fevers, chills, night sweats, swollen lymph nodes, weight loss, chest pain, body aches, joint swelling, muscle aches, shortness of breath, mood changes, visual or auditory hallucinations.  Objective:    General: Well Developed, well nourished, and in no acute distress.  Neuro: Alert and oriented x3, extra-ocular muscles intact, sensation grossly intact.  HEENT: Normocephalic, atraumatic, pupils equal round reactive to light, neck supple, no masses, no lymphadenopathy, thyroid nonpalpable.  Skin: Warm and dry, no rashes noted.  Cardiac: Regular rate and rhythm, no murmurs rubs or gallops.  Respiratory: Clear to auscultation bilaterally. Not using accessory muscles, speaking in full sentences.  Abdominal: Soft, nontender, nondistended, positive bowel sounds, no masses, no organomegaly.  Left hip: ROM IR: 10 deg,  with mild reproduction of pain, ER: 60 Deg with reproduction of pain, Flexion: 120 Deg, Extension: 100 Deg, Abduction: 45 Deg, Adduction: 45 Deg Strength IR: 5/5, ER: 5/5, Flexion: 5/5, Extension: 5/5, Abduction: 5/5, Adduction: 5/5 Pelvic alignment unremarkable to inspection and palpation. Standing hip rotation and gait without trendelenburg / unsteadiness. Greater trochanter without tenderness to palpation. No tenderness over piriformis. No SI joint tenderness and normal minimal SI  movement.  X-rays personally reviewed, on the left there is loss of femoroacetabular joint space, as well as femoral head osteophytes consistent with hip osteoarthritis.  Impression and Recommendations:    The patient was counselled, risk factors were discussed, anticipatory guidance given.  Primary osteoarthritis of left hip Start conservative, naproxen, rehab exercises. Return to see me in 4 to 8 weeks.  Benign essential hypertension Amlodipine 5 mg daily. 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  Atrial fibrillation (HCC) Rhythm controlled with flecainide and diltiazem  Hypothyroidism Stable on levothyroxine. We may check some labs at follow-up visit.   ___________________________________________ Ihor Austin. Benjamin Stain, M.D., ABFM., CAQSM. Primary Care and Sports Medicine [facility]  Adjunct Professor of Family Medicine  [employer]

## 2018-10-08 NOTE — Assessment & Plan Note (Signed)
Stable on levothyroxine. We may check some labs at follow-up visit.

## 2018-11-01 DIAGNOSIS — F4320 Adjustment disorder, unspecified: Secondary | ICD-10-CM | POA: Diagnosis not present

## 2018-12-17 DIAGNOSIS — F4320 Adjustment disorder, unspecified: Secondary | ICD-10-CM | POA: Diagnosis not present

## 2018-12-25 ENCOUNTER — Telehealth: Payer: Self-pay | Admitting: Cardiology

## 2018-12-25 DIAGNOSIS — I10 Essential (primary) hypertension: Secondary | ICD-10-CM

## 2018-12-25 MED ORDER — AMLODIPINE BESYLATE 5 MG PO TABS: 5.0000 mg | ORAL_TABLET | Freq: Every day | ORAL | 3 refills | Status: DC

## 2018-12-25 NOTE — Telephone Encounter (Signed)
Refilled Amlodipine at his request. Candee Furbish, MD

## 2019-01-14 DIAGNOSIS — E063 Autoimmune thyroiditis: Secondary | ICD-10-CM | POA: Diagnosis not present

## 2019-01-14 DIAGNOSIS — R030 Elevated blood-pressure reading, without diagnosis of hypertension: Secondary | ICD-10-CM | POA: Diagnosis not present

## 2019-01-14 DIAGNOSIS — E038 Other specified hypothyroidism: Secondary | ICD-10-CM | POA: Diagnosis not present

## 2019-01-14 DIAGNOSIS — E7800 Pure hypercholesterolemia, unspecified: Secondary | ICD-10-CM | POA: Diagnosis not present

## 2019-03-31 ENCOUNTER — Other Ambulatory Visit: Payer: Self-pay | Admitting: *Deleted

## 2019-03-31 DIAGNOSIS — I10 Essential (primary) hypertension: Secondary | ICD-10-CM

## 2019-03-31 MED ORDER — AMLODIPINE BESYLATE 5 MG PO TABS: 5.0000 mg | ORAL_TABLET | Freq: Every day | ORAL | 3 refills | Status: DC

## 2019-04-01 ENCOUNTER — Other Ambulatory Visit: Payer: Self-pay | Admitting: Cardiology

## 2019-04-08 ENCOUNTER — Encounter: Payer: Self-pay | Admitting: Cardiology

## 2019-04-08 ENCOUNTER — Ambulatory Visit (INDEPENDENT_AMBULATORY_CARE_PROVIDER_SITE_OTHER): Payer: BC Managed Care – PPO | Admitting: Cardiology

## 2019-04-08 ENCOUNTER — Other Ambulatory Visit: Payer: Self-pay

## 2019-04-08 VITALS — BP 120/60 | HR 92 | Ht 71.0 in | Wt 174.0 lb

## 2019-04-08 DIAGNOSIS — I480 Paroxysmal atrial fibrillation: Secondary | ICD-10-CM

## 2019-04-08 DIAGNOSIS — I483 Typical atrial flutter: Secondary | ICD-10-CM | POA: Diagnosis not present

## 2019-04-08 DIAGNOSIS — I10 Essential (primary) hypertension: Secondary | ICD-10-CM

## 2019-04-08 MED ORDER — TELMISARTAN 20 MG PO TABS: 20.0000 mg | ORAL_TABLET | Freq: Every day | ORAL | 3 refills | Status: DC

## 2019-04-08 MED ORDER — RIVAROXABAN 20 MG PO TABS: 20.0000 mg | ORAL_TABLET | Freq: Every day | ORAL | 11 refills | Status: DC

## 2019-04-08 NOTE — Progress Notes (Signed)
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 Elyse Hsu.  Back in 08/2015 had episode of atrial fibrillation after attending a wedding in [place]. Came back home, ran, and was surprised to find AFIB on ECG.  Converted on own back in 2017.  In 2019 EKG is demonstrating atrial fibrillation with heart rates ranging between 110 and 120 bpm.  He thinks perhaps his endurance when running has decreased.  Perhaps these are his symptoms.  .  CHADSVASC - 0  Has thyroid condition on synthroid controlled.   Denies snoring.   09/10/17-he still remains in atrial fibrillation although rate is slightly improved around 100 today with diltiazem 120 CD.  We discussed the next steps and he would like to see if he could possibly convert chemically without cardioversion.  We will start flecainide as below.  Has had no history of coronary artery disease.  11/19/2017-cardioversion took place on 10/01/2017.  Successful.  On flecainide 100 mg twice a day.  Treadmill subsequently was reassuring no QRS widening, no arrhythmias.  Feeling well.  No chest pain fevers chills nausea vomiting syncope bleeding.  He is now off of Xarelto.  04/08/2019-here for follow-up of atrial fibrillation.  His EKG today demonstrates typical atrial flutter heart rate of 92 bpm.  He noticed this may be about a month ago.  Does not feel himself.  Slightly less endurance but he has not been training like he used to.  Just ordered a Peloton.  No strokelike symptoms no chest pain fevers chills nausea vomiting syncope.  He has been compliant with his medications.  He was started on amlodipine for blood pressure in addition  to his diltiazem  Past Medical History:  Diagnosis Date  . Hypothyroidism   . Thyroid disease     Past Surgical History:  Procedure Laterality Date  . CARDIOVERSION N/A 10/01/2017   Procedure: CARDIOVERSION;  Surgeon: Lelon Perla, MD;  Location: [facility] ENDOSCOPY;  Service: Cardiovascular;  Laterality: N/A;    Current Medications: Current Meds  Medication Sig  . diltiazem (CARDIZEM CD) 240 MG 24 hr capsule Take 1 capsule (240 mg total) by mouth daily. Pt needs to keep upcoming appt in Nov for further refills  . flecainide (TAMBOCOR) 100 MG tablet Take 1 tablet (100 mg total) by mouth 2 (two) times daily. Pt needs to keep upcoming appt in Nov for further refills  . levothyroxine (SYNTHROID) 112 MCG tablet Take 1 tablet by mouth once day in the AM on an empty stomach for thyroid (name brand only DAW)  . Multiple Vitamin (MULTIVITAMIN) capsule Take 1 capsule by mouth every evening.   [name] Glycol-Propyl Glycol (SYSTANE OP) Place 4-5 drops into both eyes 2 (two) times daily.   . [DISCONTINUED] amLODipine (NORVASC) 5 MG tablet Take 1 tablet (5 mg total) by mouth daily.     Allergies:   Patient has no known allergies.   Social History   Socioeconomic History  . Marital status: Married    Spouse name: Not on file  . Number of children: Not on file  . Years of education: Not on file  .  Highest education level: Not on file  Occupational History  . Not on file  Social Needs  . Financial resource strain: Not on file  . Food insecurity    Worry: Not on file    Inability: Not on file  . Transportation needs    Medical: Not on file    Non-medical: Not on file  Tobacco Use  . Smoking status: Never Smoker  . Smokeless tobacco: Never Used  Substance and Sexual Activity  . Alcohol use: Yes    Comment: occ  . Drug use: No  . Sexual activity: Not on file  Lifestyle  . Physical activity    Days per week: Not on file    Minutes per session: Not on file  . Stress: Not on file   Relationships  . Social Musician on phone: Not on file    Gets together: Not on file    Attends religious service: Not on file    Active member of club or organization: Not on file    Attends meetings of clubs or organizations: Not on file    Relationship status: Not on file  Other Topics Concern  . Not on file  Social History Narrative  . Not on file     Family History: No early family history of CAD  ROS:   Please see the history of present illness.    All other ROS neg.  EKGs/Labs/Other Studies Reviewed:    The following studies were reviewed today:  ECHO: 09/24/15 - Left ventricle: Wall thickness was increased in a pattern of mild   LVH. Systolic function was normal. The estimated ejection   fraction was in the range of 50% to 55%. - Left atrium: The atrium was mildly dilated. - Right atrium: The atrium was moderately dilated.  ETT 10/2017-reassuring  EKG:  08/31/17-atrial fibrillation heart rate 120 bpm, LVH borderline.  Personally viewed.  In 09/20/15 - AFIB rate 70, borderline LVH.   Recent Labs: No results found for requested labs within last 8760 hours.  Recent Lipid Panel No results found for: CHOL, TRIG, HDL, CHOLHDL, VLDL, LDLCALC, LDLDIRECT     Physical Exam:    VS:  BP 120/60   Pulse 92   Ht 5\' 11"  (1.803 m)   Wt 174 lb (78.9 kg)   SpO2 99%   BMI 24.27 kg/m     Wt Readings from Last 3 Encounters:  04/08/19 174 lb (78.9 kg)  10/08/18 180 lb (81.6 kg)  11/19/17 177 lb 6.4 oz (80.5 kg)    GEN: Well nourished, well developed, in no acute distress  HEENT: normal  Neck: no JVD, carotid bruits, or masses Cardiac: RRR; no murmurs, rubs, or gallops,no edema  Respiratory:  clear to auscultation bilaterally, normal work of breathing GI: soft, nontender, nondistended, + BS MS: no deformity or atrophy  Skin: warm and dry, no rash Neuro:  Alert and Oriented x 3, Strength and sensation are intact Psych: euthymic mood, full affect     ASSESSMENT:    1. Typical atrial flutter (HCC)   2. Paroxysmal atrial fibrillation (HCC)   3. Benign essential hypertension    PLAN:    In order of problems listed above:  Typical atrial flutter -Symptomatic, currently rate controlled in the 90s.  Negative deflection in lead II, III, aVF positive in V1.  Occasional PVC noted. -This is occurring while taking flecainide 100 mg twice a day and diltiazem 240 mg once a day.  No longer taking  metoprolol 25 mg at bedtime. -Given the recurrence of atrial arrhythmia, I will go ahead and restart him on Xarelto 20 mg once a day.  This will give him the opportunity to proceed with cardioversion in 3 weeks if necessary. -In addition, I will refer him to Dr. Johney Frame to discuss potential flutter/fib ablation. -I will check an echocardiogram since has been since 2017.  Previously he was described as having mild left atrial enlargement and moderate right atrial enlargement.  Paroxysmal atrial fibrillation -Back in 2017 he auto converted.  Most recent episode in 2019 however required cardioversion as well as antiarrhythmic flecanide, currently 100 mg twice a day.  Treadmill performed and reassuring.  Xarelto was continued for 4 weeks after cardioversion.   -He understands to use alcohol with moderation.  Make sure his thyroid is under good control. CHADSVASC - 0.  No long-term needs for anticoagulation.  Essential hypertension -We will go ahead and stop his amlodipine since he is already on diltiazem and start him on telmisartan 20 mg low-dose.  In the future, repeat basic metabolic profile.  Hypothyroidism -Continue with Synthroid.  We will see him back in 6 months.  He knows to call if any difficulties occur.    Medication Adjustments/Labs and Tests Ordered: Current medicines are reviewed at length with the patient today.  Concerns regarding medicines are outlined above.  Orders Placed This Encounter  Procedures  . Ambulatory referral to Cardiac  Electrophysiology  . EKG 12-Lead  . ECHOCARDIOGRAM COMPLETE   Meds ordered this encounter  Medications  . telmisartan (MICARDIS) 20 MG tablet    Sig: Take 1 tablet (20 mg total) by mouth daily.    Dispense:  90 tablet    Refill:  3  . rivaroxaban (XARELTO) 20 MG TABS tablet    Sig: Take 1 tablet (20 mg total) by mouth daily with supper.    Dispense:  30 tablet    Refill:  11    Signed, Donato Schultz, MD  04/08/2019 4:24 PM    [facility]

## 2019-04-08 NOTE — Patient Instructions (Signed)
Medication Instructions:  Please discontinue you Amlodipine and start Telmisartan 20 mg daily.  Start Xarelto 20 mg daily.  Continue all other medications as listed.  *If you need a refill on your cardiac medications before your next appointment, please call your pharmacy*  Testing/Procedures: Your physician has requested that you have an echocardiogram. Echocardiography is a painless test that uses sound waves to create images of your heart. It provides your doctor with information about the size and shape of your heart and how well your heart's chambers and valves are working. This procedure takes approximately one hour. There are no restrictions for this procedure.  You have been referred to Dr Thompson Grayer to discuss At Fib/flutter ablation.   Follow-Up: At [facility], you and your health needs are our priority.  As part of our continuing mission to provide you with exceptional heart care, we have created designated Provider Care Teams.  These Care Teams include your primary Cardiologist (physician) and Advanced Practice Providers (APPs -  Physician Assistants and Nurse Practitioners) who all work together to provide you with the care you need, when you need it.  Your next appointment:   6 months  The format for your next appointment:   In Person  Provider:   Candee Furbish, MD  Thank you for choosing [facility]!!

## 2019-04-18 ENCOUNTER — Ambulatory Visit (HOSPITAL_COMMUNITY): Payer: BC Managed Care – PPO | Attending: Cardiovascular Disease

## 2019-04-18 ENCOUNTER — Other Ambulatory Visit: Payer: Self-pay

## 2019-04-18 ENCOUNTER — Telehealth: Payer: Self-pay | Admitting: *Deleted

## 2019-04-18 DIAGNOSIS — I480 Paroxysmal atrial fibrillation: Secondary | ICD-10-CM

## 2019-04-18 DIAGNOSIS — I10 Essential (primary) hypertension: Secondary | ICD-10-CM | POA: Diagnosis not present

## 2019-04-18 MED ORDER — METOPROLOL SUCCINATE ER 50 MG PO TB24: 50.0000 mg | ORAL_TABLET | Freq: Every day | ORAL | 3 refills | Status: DC

## 2019-04-18 NOTE — Telephone Encounter (Signed)
Per Dr Marlou Porch documentation on echo result, pt is aware of results and therapy changes.  Pt assistance cards for 30 day free and co-pay card for Xarelto mailed to pt's home address.

## 2019-04-18 NOTE — Telephone Encounter (Signed)
Hey Pam   His EF is a lot lower now 30 to 35%.   Please stop his diltiazem and flecainide, take off the list, and send him metoprolol succinate 50 mg once a day.   Thanks   Candee Furbish, MD   Medication list changed and RX sent into pharmacy.

## 2019-04-28 ENCOUNTER — Telehealth (INDEPENDENT_AMBULATORY_CARE_PROVIDER_SITE_OTHER): Payer: BC Managed Care – PPO | Admitting: Internal Medicine

## 2019-04-28 ENCOUNTER — Other Ambulatory Visit: Payer: Self-pay

## 2019-04-28 ENCOUNTER — Encounter: Payer: Self-pay | Admitting: Internal Medicine

## 2019-04-28 VITALS — Ht 71.0 in | Wt 174.0 lb

## 2019-04-28 DIAGNOSIS — Z7901 Long term (current) use of anticoagulants: Secondary | ICD-10-CM

## 2019-04-28 DIAGNOSIS — E039 Hypothyroidism, unspecified: Secondary | ICD-10-CM | POA: Diagnosis not present

## 2019-04-28 DIAGNOSIS — I10 Essential (primary) hypertension: Secondary | ICD-10-CM

## 2019-04-28 DIAGNOSIS — I483 Typical atrial flutter: Secondary | ICD-10-CM

## 2019-04-28 DIAGNOSIS — I429 Cardiomyopathy, unspecified: Secondary | ICD-10-CM | POA: Diagnosis not present

## 2019-04-28 DIAGNOSIS — I4819 Other persistent atrial fibrillation: Secondary | ICD-10-CM | POA: Diagnosis not present

## 2019-04-28 DIAGNOSIS — I428 Other cardiomyopathies: Secondary | ICD-10-CM

## 2019-04-28 DIAGNOSIS — Z79899 Other long term (current) drug therapy: Secondary | ICD-10-CM

## 2019-04-28 NOTE — Progress Notes (Signed)
Electrophysiology TeleHealth Note   Due to national recommendations of social distancing due to [place] 19, an audio telehealth visit is felt to be most appropriate for this patient at this time.  Verbal consent was obtained by me for the telehealth visit today.  The patient does not have capability for a virtual visit.  A phone visit is therefore required today.   Date:  04/28/2019   ID:  Walter [name], DOB 03/20/1976, MRN [id number]  Location: patient's home  Provider location:  [facility]  Evaluation Performed: Follow-up visit  PCP:  Patient, No Pcp Per   Referring [name]: Walter [name]  Electrophysiologist:  Walter [name]  Reason for Consult: AF/flutter  History of Present Illness:    Walter [name] is a 43 y.o. male who presents via telehealth conferencing today. He is referred by Walter [name] for evaluation of atrial fibrillation and atrial flutter.  He was first diagnosed with atrial fibrillation in 2017. He was cardioverted at that time and did well until 2019 when he had recurrent atrial fibrillation. He underwent repeat cardioversion at that time and was started on Flecainide. Recently, he has had increased AF burden.  Recent echo demonstrated depressed LVEF. He did not have rheumatic fever. He snores but has not had a sleep study.  Today, he denies symptoms of palpitations, chest pain, shortness of breath,  lower extremity edema, dizziness, presyncope, or syncope.  The patient is otherwise without complaint today.  The patient denies symptoms of fevers, chills, cough, or new SOB worrisome for COVID 19.  Past Medical History:  Diagnosis Date  . Cardiomyopathy ([place])   . Hypothyroidism   . Persistent atrial fibrillation ([place])   . Typical atrial flutter [facility])     Past Surgical History:  Procedure Laterality Date  . CARDIOVERSION N/A 10/01/2017   Procedure: CARDIOVERSION;  Surgeon: Walter Perla, [name];  Location: [facility] ENDOSCOPY;  Service: Cardiovascular;  Laterality: N/A;    Current  Outpatient Medications  Medication Sig Dispense Refill  . levothyroxine (SYNTHROID) 112 MCG tablet Take 1 tablet by mouth once day in the AM on an empty stomach for thyroid (name brand only DAW)    . metoprolol succinate (TOPROL-XL) 50 MG 24 hr tablet Take 1 tablet (50 mg total) by mouth daily. Take with or immediately following a meal. 90 tablet 3  . Multiple Vitamin (MULTIVITAMIN) capsule Take 1 capsule by mouth every evening.     Walter [name] Glycol-Propyl Glycol (SYSTANE OP) Place 4-5 drops into both eyes 2 (two) times daily.     . rivaroxaban (XARELTO) 20 MG TABS tablet Take 1 tablet (20 mg total) by mouth daily with supper. 30 tablet 11  . telmisartan (MICARDIS) 20 MG tablet Take 1 tablet (20 mg total) by mouth daily. 90 tablet 3   No current facility-administered medications for this visit.     Allergies:   Patient has no known allergies.   Social History:  The patient  reports that he has never smoked. He has never used smokeless tobacco. He reports current alcohol use. He reports that he does not use drugs.   Family History:  The patient's family history includes Atrial fibrillation in his maternal grandmother.   ROS:  Please see the history of present illness.   All other systems are personally reviewed and negative.    Exam:    Vital Signs:  Ht 5\' 11"  (1.803 m)   Wt 174 lb (78.9 kg)   BMI 24.27 kg/m   Well sounding,  alert and conversant, regular work of breathing   Labs/Other Tests and Data Reviewed:    Recent Labs: No results found for requested labs within last 8760 hours.   Wt Readings from Last 3 Encounters:  04/28/19 174 lb (78.9 kg)  04/08/19 174 lb (78.9 kg)  10/08/18 180 lb (81.6 kg)      ASSESSMENT & PLAN:    1.  Persistent atrial fibrillation/typical atrial flutter He has had recurrent persistent atrial fibrillation and atrial flutter despite Flecainide. He also has a newly identified depressed LVEF.  He has no ischemic symptoms.  CHADS2VASC is 1  (hypertension) - continue Xarelto Therapeutic strategies for afib including medicine and ablation were discussed in detail with the patient today. Risk, benefits, and alternatives to EP study and radiofrequency ablation for afib were also discussed in detail today. These risks include but are not limited to stroke, bleeding, vascular damage, tamponade, perforation, damage to the esophagus, lungs, and other structures, pulmonary vein stenosis, worsening renal function, and death. The patient understands these risk and wishes to proceed.  We will therefore proceed with catheter ablation at the next available time.  Carto, ICE, anesthesia are requested for the procedure.  Will also obtain cardiac CT prior to the procedure to exclude LAA thrombus and further evaluate atrial anatomy. Would also gate for CAD with cardiomyopathy.  2.  HTN Stable No change required today  3. Hypothyroidism Stable No change required today  4.  Cardiomyopathy Hopefully tachycardia mediated Plan CT scan prior to ablation to rule out CAD Will re-evaluate EF after 3 months of SR    Follow-up:  After AF ablation    Patient Risk:  after full review of this patients clinical status, I feel that they are at moderate risk at this time.   Randolm Idol, [name]  04/28/2019 10:23 AM     [facility] [address] [place] [address] [phone] (office) [phone] (fax)

## 2019-04-30 ENCOUNTER — Telehealth: Payer: Self-pay

## 2019-04-30 NOTE — Telephone Encounter (Signed)
-----   Message from Thompson Grayer, MD sent at 04/28/2019 10:31 AM EST ----- Schedule afib ablation He says today is not a good day for you to call but if you could call tomorrow, he will try to look at his schedule prior to your call He is a Industrial/product designer.  Carto/ICE/Anesthesia  Cardiac CT-  please obtain FFR to evaluate his cor also as his EF is newly low

## 2019-04-30 NOTE — Telephone Encounter (Signed)
Outreach made to Pt.  Left message to return this nurse call.

## 2019-05-06 DIAGNOSIS — F4320 Adjustment disorder, unspecified: Secondary | ICD-10-CM | POA: Diagnosis not present

## 2019-05-14 DIAGNOSIS — R41844 Frontal lobe and executive function deficit: Secondary | ICD-10-CM | POA: Diagnosis not present

## 2019-05-27 NOTE — Telephone Encounter (Signed)
Left message for Pt to call back.

## 2019-08-09 ENCOUNTER — Ambulatory Visit: Payer: BC Managed Care – PPO | Attending: Internal Medicine

## 2019-08-09 DIAGNOSIS — Z23 Encounter for immunization: Secondary | ICD-10-CM

## 2019-08-09 NOTE — Progress Notes (Signed)
   Covid-19 Vaccination Clinic  Name:  Daiel Strohecker, MD    MRN: [id number] DOB: December 15, 1975  08/09/2019  Mr. Corniel was observed post Covid-19 immunization for 15 minutes without incident. He was provided with Vaccine Information Sheet and instruction to access the V-Safe system.   Mr. Ferrall was instructed to call 911 with any severe reactions post vaccine: Marland Kitchen Difficulty breathing  . Swelling of face and throat  . A fast heartbeat  . A bad rash all over body  . Dizziness and weakness   Immunizations Administered    Name Date Dose VIS Date Route   Pfizer COVID-19 Vaccine 08/09/2019  8:15 AM 0.3 mL 05/09/2019 Intramuscular   Manufacturer: [employer], [employer]   Lot: [id number]   NDC: [address]-9

## 2019-08-13 ENCOUNTER — Encounter: Payer: Self-pay | Admitting: Family

## 2019-09-02 ENCOUNTER — Encounter: Payer: Self-pay | Admitting: Family

## 2019-09-02 ENCOUNTER — Other Ambulatory Visit: Payer: Self-pay

## 2019-09-02 ENCOUNTER — Ambulatory Visit (INDEPENDENT_AMBULATORY_CARE_PROVIDER_SITE_OTHER): Payer: BC Managed Care – PPO | Admitting: Family

## 2019-09-02 ENCOUNTER — Encounter (INDEPENDENT_AMBULATORY_CARE_PROVIDER_SITE_OTHER): Payer: Self-pay

## 2019-09-02 VITALS — BP 112/68 | HR 77 | Temp 98.1°F | Ht 71.0 in | Wt 172.0 lb

## 2019-09-02 DIAGNOSIS — M79652 Pain in left thigh: Secondary | ICD-10-CM | POA: Diagnosis not present

## 2019-09-02 DIAGNOSIS — E039 Hypothyroidism, unspecified: Secondary | ICD-10-CM | POA: Diagnosis not present

## 2019-09-02 DIAGNOSIS — I480 Paroxysmal atrial fibrillation: Secondary | ICD-10-CM | POA: Diagnosis not present

## 2019-09-02 DIAGNOSIS — I10 Essential (primary) hypertension: Secondary | ICD-10-CM

## 2019-09-02 NOTE — Progress Notes (Signed)
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 Primary osteoarthritis of left hip 10/08/2018  . Benign essential hypertension 10/08/2018  . Atrial fibrillation ([place]) 10/08/2018  . Hypothyroidism 10/08/2018    Current Outpatient Medications  Medication Sig Dispense Refill  . diltiazem (TIAZAC) 240 MG 24 hr capsule Take 1 tablet once a day    . levothyroxine (SYNTHROID) 112 MCG tablet Take 1 tablet by mouth once day in the AM on an empty stomach for thyroid (name brand only DAW)    . metoprolol succinate (TOPROL-XL) 50 MG 24 hr tablet Take 1 tablet (50 mg total) by mouth daily. Take with or immediately following a meal. 90 tablet 3  . Multiple Vitamin (MULTIVITAMIN) capsule Take 1 capsule by mouth every evening.     [name] Glycol-Propyl Glycol (SYSTANE OP) Place 4-5 drops into both eyes 2 (two) times daily.     Marland Kitchen telmisartan (MICARDIS) 20 MG tablet Take 1 tablet (20 mg total) by mouth daily. 90 tablet 3  . rivaroxaban (XARELTO) 20 MG TABS tablet Take 1 tablet (20 mg total) by mouth daily with supper. (Patient not taking: Reported on 09/02/2019) 30 tablet 11   No current facility-administered medications for this visit.    Allergies: Patient has no known allergies.  Past Medical History:  Diagnosis Date  . Cardiomyopathy ([place])   . Hypothyroidism   . Persistent atrial fibrillation ([place])   . Typical atrial flutter [facility])     Past Surgical History:  Procedure Laterality Date  . CARDIOVERSION N/A 10/01/2017   Procedure: CARDIOVERSION;  Surgeon: Lelon Perla, MD;  Location: [facility] ENDOSCOPY;  Service: Cardiovascular;  Laterality: N/A;    Family History  Problem Relation Age of Onset  . Atrial fibrillation Maternal Grandmother   . Heart failure Neg Hx     Social History   Tobacco Use  . Smoking status: Never Smoker  . Smokeless tobacco: Never Used  Substance Use Topics  . Alcohol use:  Yes    Comment: occ    Subjective:  Presents today as new patient; just wanted to get established with primary care provider; Under care of cardiology for hypertension, atrial fibrillation.  Under care of endocrine for management of hypothyroidism- last TSH was .334; planning to reach out to his endocrinologist to discuss changing his dosage. Does exercise at least 6-7 days per week; chronic left thigh pain x 8-10 months; X-rays were unremarkable except possible arthritis changes in left hip;  Overdue to see dentist; up to date on eye doctor;     Objective:  Vitals:   09/02/19 1120  BP: 112/68  Pulse: 77  Temp: 98.1 F (36.7 C)  TempSrc: Oral  SpO2: 98%  Weight: 172 lb (78 kg)  Height: 5\' 11"  (1.803 m)    General: Well developed, well nourished, in no acute distress  Skin : Warm and dry.  Head: Normocephalic and atraumatic  Lungs: Respirations unlabored; clear to auscultation bilaterally without wheeze, rales, rhonchi  CVS exam: normal rate and regular rhythm.  Musculoskeletal: No deformities; no active joint inflammation  Extremities: No edema, cyanosis, clubbing  Vessels: Symmetric bilaterally  Neurologic: Alert and oriented; speech intact; face symmetrical; moves all extremities well; CNII-XII intact without focal deficit   Assessment:  1. Benign essential hypertension   2. Paroxysmal atrial fibrillation (HCC)   3. Hypothyroidism, unspecified type   4. Left thigh pain  Plan:  1. Stable; continue same medications; 2. Under care of cardiology; 3. Planning to follow up with endocrinologist to discuss dosage change; 4. Scheduled to see Dr. Katrinka Blazing next week;  Labs from recent employee screening are reviewed and scanned into system for review.  This visit occurred during the SARS-CoV-2 public health emergency.  Safety protocols were in place, including screening questions prior to the visit, additional usage of staff PPE, and extensive cleaning of exam room while  observing appropriate contact time as indicated for disinfecting solutions.    No follow-ups on file.  No orders of the defined types were placed in this encounter.   Requested Prescriptions    No prescriptions requested or ordered in this encounter

## 2019-09-03 ENCOUNTER — Ambulatory Visit: Payer: BC Managed Care – PPO | Attending: Internal Medicine

## 2019-09-03 DIAGNOSIS — Z23 Encounter for immunization: Secondary | ICD-10-CM

## 2019-09-03 NOTE — Progress Notes (Signed)
   Covid-19 Vaccination Clinic  Name:  Walter Osborn, MD    MRN: [id number] DOB: 12-27-1975  09/03/2019  Walter [name] was observed post Covid-19 immunization for 15 minutes without incident. He was provided with Vaccine Information Sheet and instruction to access the V-Safe system.   Walter [name] was instructed to call 911 with any severe reactions post vaccine: Marland Kitchen Difficulty breathing  . Swelling of face and throat  . A fast heartbeat  . A bad rash all over body  . Dizziness and weakness   Immunizations Administered    Name Date Dose VIS Date Route   Pfizer COVID-19 Vaccine 09/03/2019  8:54 AM 0.3 mL 05/09/2019 Intramuscular   Manufacturer: [employer], [employer]   Lot: [id number]   NDC: [address]-9

## 2019-09-08 DIAGNOSIS — F4321 Adjustment disorder with depressed mood: Secondary | ICD-10-CM | POA: Diagnosis not present

## 2019-09-11 ENCOUNTER — Ambulatory Visit (INDEPENDENT_AMBULATORY_CARE_PROVIDER_SITE_OTHER): Payer: BC Managed Care – PPO | Admitting: Family Medicine

## 2019-09-11 ENCOUNTER — Encounter: Payer: Self-pay | Admitting: Family Medicine

## 2019-09-11 ENCOUNTER — Other Ambulatory Visit: Payer: Self-pay

## 2019-09-11 DIAGNOSIS — M21952 Unspecified acquired deformity of left thigh: Secondary | ICD-10-CM | POA: Diagnosis not present

## 2019-09-11 MED ORDER — MELOXICAM 7.5 MG PO TABS: 7.5000 mg | ORAL_TABLET | Freq: Every day | ORAL | 0 refills | Status: DC

## 2019-09-11 MED ORDER — VITAMIN D (ERGOCALCIFEROL) 1.25 MG (50000 UNIT) PO CAPS: 50000.0000 [IU] | ORAL_CAPSULE | ORAL | 0 refills | Status: DC

## 2019-09-11 NOTE — Progress Notes (Signed)
[name] [address] [place] [address] Phone: [phone] Subjective:   I Walter [name] am serving as a Neurosurgeon for Dr. Antoine Primas.  This visit occurred during the SARS-CoV-2 public health emergency.  Safety protocols were in place, including screening questions prior to the visit, additional usage of staff PPE, and extensive cleaning of exam room while observing appropriate contact time as indicated for disinfecting solutions.   I'm seeing this patient by the request  of:  Olive Bass, FNP  CC: Left hip pain  [name]  Walter [name], Walter [name] is a 44 y.o. male coming in with complaint of left hip and thigh pain. Patient states it has been 8 months. Left hip arthritis. Patient states it is not bone pain. Feels life soft tissue. Bikes but the pain does not bother him. Sitting for long periods of time is painful. Flexion to the left side is painful. Used to play a lot of tennis and running. Tight and sore pain. Lateral quad pain.  3-4/10 at its worse.       Past Medical History:  Diagnosis Date  . Cardiomyopathy (HCC)   . Hypothyroidism   . Persistent atrial fibrillation (HCC)   . Typical atrial flutter [facility])    Past Surgical History:  Procedure Laterality Date  . CARDIOVERSION N/A 10/01/2017   Procedure: CARDIOVERSION;  Surgeon: Lewayne Bunting, Walter [name];  Location: [facility] ENDOSCOPY;  Service: Cardiovascular;  Laterality: N/A;   Social History   Socioeconomic History  . Marital status: Married    Spouse name: Not on file  . Number of children: Not on file  . Years of education: Not on file  . Highest education level: Not on file  Occupational History  . Not on file  Tobacco Use  . Smoking status: Never Smoker  . Smokeless tobacco: Never Used  Substance and Sexual Activity  . Alcohol use: Yes    Comment: occ  . Drug use: No  . Sexual activity: Not on file  Other Topics Concern  . Not on file  Social History Narrative   Lives in [place] alone.  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 Difficulty of Paying Living Expenses:   Food Insecurity:   . Worried About Programme researcher, broadcasting/film/video in the Last Year:   . Barista in the Last Year:   Transportation Needs:   . Freight forwarder (Medical):   Marland Kitchen Lack of Transportation (Non-Medical):   Physical Activity:   . Days of Exercise per Week:   . Minutes of Exercise per Session:   Stress:   . Feeling of Stress :   Social Connections:   . Frequency of Communication with Friends and Family:   . Frequency of Social Gatherings with Friends and Family:   . Attends Religious Services:   . Active Member of Clubs or Organizations:   . Attends Banker Meetings:   Marland Kitchen Marital Status:    No Known Allergies Family History  Problem Relation Age of Onset  . Atrial fibrillation Maternal Grandmother   . Heart failure Neg Hx     Current Outpatient Medications (Endocrine & Metabolic):  .  levothyroxine (SYNTHROID) 112 MCG tablet, Take 1 tablet by mouth once day in the AM on an empty stomach for thyroid (name brand only DAW)  Current Outpatient  Medications (Cardiovascular):  .  diltiazem (TIAZAC) 240 MG 24 hr capsule, Take 1 tablet once a day .  metoprolol succinate (TOPROL-XL) 50 MG 24 hr tablet, Take 1 tablet (50 mg total) by mouth daily. Take with or immediately following a meal. .  telmisartan (MICARDIS) 20 MG tablet, Take 1 tablet (20 mg total) by mouth daily.   Current Outpatient Medications (Analgesics):  .  meloxicam (MOBIC) 7.5 MG tablet, Take 1 tablet (7.5 mg total) by mouth daily.  Current Outpatient Medications (Hematological):  .  rivaroxaban (XARELTO) 20 MG TABS tablet, Take 1 tablet (20 mg total) by mouth daily with supper.  Current Outpatient Medications (Other):  Marland Kitchen  Multiple Vitamin (MULTIVITAMIN) capsule, Take 1  capsule by mouth every evening.  [name] Glycol-Propyl Glycol (SYSTANE OP), Place 4-5 drops into both eyes 2 (two) times daily.  .  Vitamin D, Ergocalciferol, (DRISDOL) 1.25 MG (50000 UNIT) CAPS capsule, Take 1 capsule (50,000 Units total) by mouth every 7 (seven) days.   Reviewed prior external information including notes and imaging from  primary care provider As well as notes that were available from care everywhere and other healthcare systems.  Past medical history, social, surgical and family history all reviewed in electronic medical record.  No pertanent information unless stated regarding to the chief complaint.   Review of Systems:  No headache, visual changes, nausea, vomiting, diarrhea, constipation, dizziness, abdominal pain, skin rash, fevers, chills, night sweats, weight loss, swollen lymph nodes, body aches, joint swelling, chest pain, shortness of breath, mood changes. POSITIVE muscle aches  Objective  Blood pressure 110/80, pulse (!) 51, height 5\' 11"  (1.803 m), weight 172 lb (78 kg), SpO2 94 %.   General: No apparent distress alert and oriented x3 mood and affect normal, dressed appropriately.  HEENT: Pupils equal, extraocular movements intact  Respiratory: Patient's speak in full sentences and does not appear short of breath  Cardiovascular: No lower extremity edema, non tender, no erythema  Neuro: Cranial nerves II through XII are intact, neurovascularly intact in all extremities with 2+ DTRs and 2+ pulses.  Gait normal with good balance and coordination.  MSK: Left hip exam shows the patient does have some decreased range of motion in almost every plane.  Patient not had significant decrease in external rotation.  Negative straight leg test.  4+ out of 5 strength compared to the contralateral side.    Impression and Recommendations:     This case required medical decision making of moderate complexity. The above documentation has been reviewed and is accurate  and complete Lyndal Pulley, DO       Note: This dictation was prepared with Dragon dictation along with smaller phrase technology. Any transcriptional errors that result from this process are unintentional.

## 2019-09-11 NOTE — Patient Instructions (Addendum)
Good to see you Vitamin D once weekly  Meloxicam daily for 10 days then in burst of 5 day intervals Ok to do low impact exercise Exercise 3 times a week See me again 6-8 weeks but send me an email in 4 weeks to see how you are doing

## 2019-09-11 NOTE — Assessment & Plan Note (Signed)
Patient does have a deformity of the left hand.  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  Discussed with patient about home exercises, icing regimen, which activities to do which wants to avoid.  We discussed avoiding heavy impact.  Possibly repetitive flexion.  Standing desk given.  Meloxicam and vitamin D, discussed which activities to doing which wants to avoid.  Increase activity slowly.  Follow-up again in 4 to 8 weeks could be a candidate for formal physical therapy, and potential injections if severe enough.

## 2019-09-29 NOTE — Telephone Encounter (Signed)
Mailed letter to Pt after multiple failed attempts to reach Pt by phone.  Await further needs.

## 2019-10-07 ENCOUNTER — Telehealth: Payer: Self-pay | Admitting: Cardiology

## 2019-10-07 MED ORDER — DILTIAZEM HCL ER BEADS 240 MG PO CP24: ORAL_CAPSULE | ORAL | 1 refills | Status: DC

## 2019-10-07 NOTE — Telephone Encounter (Signed)
Pt's medication was sent to pt's pharmacy as requested. Confirmation received.  °

## 2019-10-07 NOTE — Telephone Encounter (Signed)
*  STAT* If patient is at the pharmacy, call can be transferred to refill team.   1. Which medications need to be refilled? (please list name of each medication and dose if known) Diltiazem  2. Which pharmacy/location (including street and city if local pharmacy) is medication to be sent to?   [facility] [place]  3. Do they need a 30 day or 90 day supply? 90 days and refills- pt have an appt on 10-24-19

## 2019-10-14 ENCOUNTER — Other Ambulatory Visit: Payer: Self-pay | Admitting: Family Medicine

## 2019-10-24 ENCOUNTER — Encounter: Payer: Self-pay | Admitting: Cardiology

## 2019-10-24 ENCOUNTER — Ambulatory Visit (INDEPENDENT_AMBULATORY_CARE_PROVIDER_SITE_OTHER): Payer: BC Managed Care – PPO | Admitting: Cardiology

## 2019-10-24 ENCOUNTER — Encounter (INDEPENDENT_AMBULATORY_CARE_PROVIDER_SITE_OTHER): Payer: Self-pay

## 2019-10-24 ENCOUNTER — Other Ambulatory Visit: Payer: Self-pay

## 2019-10-24 VITALS — BP 110/80 | HR 76 | Ht 71.0 in | Wt 176.0 lb

## 2019-10-24 DIAGNOSIS — I10 Essential (primary) hypertension: Secondary | ICD-10-CM

## 2019-10-24 DIAGNOSIS — I4891 Unspecified atrial fibrillation: Secondary | ICD-10-CM

## 2019-10-24 DIAGNOSIS — I428 Other cardiomyopathies: Secondary | ICD-10-CM

## 2019-10-24 DIAGNOSIS — I4819 Other persistent atrial fibrillation: Secondary | ICD-10-CM | POA: Diagnosis not present

## 2019-10-24 NOTE — Progress Notes (Signed)
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  Patient of Dr. Jenel [name].  Back in 2017 first diagnosed, cardioverted at that time and then did well until about 2019.  Had repeat cardioversion started on flecainide but has recently had increased A. fib burden.  Echocardiogram also demonstrated decreased EF.  He was seen last by Dr. Johney [name] on 04/28/2019.  Plan was to proceed with ablation but this never did happen.    CT scan was going to be performed to exclude CAD prior to ablation as well.  Cardiomyopathy was thought to be hopefully tachycardia mediated.  Better BP, occasional flutter. Feels fine. Better. Back on dilt and toprol.  He was accidentally taking the amlodipine at one point.  Overall, he is pleased that he is feeling better, no shortness of breath no chest pain no syncope no bleeding.  Has been battling some left hip pain, is taking some meloxicam to help with this.  PT.  Hurts worse when bending over tying shoes or sitting for long periods of time at the microscope.   Past Medical History:  Diagnosis Date  . Cardiomyopathy (HCC)   . Hypothyroidism   . Persistent atrial fibrillation (HCC)   . Typical atrial flutter [facility])     Past Surgical History:  Procedure Laterality Date  . CARDIOVERSION N/A 10/01/2017   Procedure: CARDIOVERSION;  Surgeon: Walter Bunting, MD;  Location: [facility] ENDOSCOPY;  Service: Cardiovascular;  Laterality: N/A;    Current Medications: Current Meds  Medication Sig  . diltiazem (TIAZAC) 240 MG 24 hr capsule Take 1 tablet by mouth daily. Please keep upcoming appt in May for future refills. Thank you  . levothyroxine (SYNTHROID)  112 MCG tablet Take 1 tablet by mouth once day in the AM on an empty stomach for thyroid (name brand only DAW)  . meloxicam (MOBIC) 7.5 MG tablet TAKE 1 TABLET(7.5 MG) BY MOUTH DAILY  . metoprolol succinate (TOPROL-XL) 50 MG 24 hr tablet Take 1 tablet (50 mg total) by mouth daily. Take with or immediately following a meal.  . Multiple Vitamin (MULTIVITAMIN) capsule Take 1 capsule by mouth every evening.   Walter [name] Glycol-Propyl Glycol (SYSTANE OP) Place 4-5 drops into both eyes 2 (two) times daily.   . rivaroxaban (XARELTO) 20 MG TABS tablet Take 1 tablet (20 mg total) by mouth daily with supper.  . telmisartan (MICARDIS) 20 MG tablet Take 1 tablet (20 mg total) by mouth daily.  . Vitamin D, Ergocalciferol, (DRISDOL) 1.25 MG (50000 UNIT) CAPS capsule Take 1 capsule (50,000 Units total) by mouth every 7 (seven) days.     Allergies:   Patient has no known allergies.   Social History   Socioeconomic History  . Marital status: Married    Spouse name: Not on file  . Number of children: Not on file  . Years of education: Not on file  . Highest education level: Not on file  Occupational History  . Not on file  Tobacco Use  . Smoking status: Never Smoker  . Smokeless tobacco: Never Used  Substance and Sexual Activity  . Alcohol use: Yes    Comment: occ  .  Drug use: No  . Sexual activity: Not on file  Other Topics Concern  . Not on file  Social History Narrative   Lives in [place] alone.  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 Difficulty of Paying Living Expenses:   Food Insecurity:   . Worried About Charity fundraiser in the Last Year:   . Arboriculturist in the Last Year:   Transportation Needs:   . Film/video editor (Medical):   Marland Kitchen Lack of Transportation (Non-Medical):   Physical Activity:   . Days of Exercise per Week:   . Minutes of Exercise per  Session:   Stress:   . Feeling of Stress :   Social Connections:   . Frequency of Communication with Friends and Family:   . Frequency of Social Gatherings with Friends and Family:   . Attends Religious Services:   . Active Member of Clubs or Organizations:   . Attends Archivist Meetings:   Marland Kitchen Marital Status:      Family History: The patient's family history includes Atrial fibrillation in his maternal grandmother. There is no history of Heart failure.  ROS:   Please see the history of present illness.     All other systems reviewed and are negative.  EKGs/Labs/Other Studies Reviewed:    The following studies were reviewed today:  Echocardiogram 04/18/2019:  1. Left ventricular ejection fraction, by visual estimation, is 30 to  35%. The left ventricle has moderately decreased function. Left  ventricular septal wall thickness was mildly increased. There is no left  ventricular hypertrophy.  2. Mildly dilated left ventricular internal cavity size.  3. Diffuse hypokinesis EF has worsened since 2017.  4. Global right ventricle has normal systolic function.The right  ventricular size is normal. No increase in right ventricular wall  thickness.  5. Left atrial size was normal.  6. Right atrial size was normal.  7. The mitral valve is normal in structure. Trace mitral valve  regurgitation. No evidence of mitral stenosis.  8. The tricuspid valve is normal in structure. Tricuspid valve  regurgitation is mild.  9. The aortic valve is normal in structure. Aortic valve regurgitation is  not visualized. No evidence of aortic valve sclerosis or stenosis.  10. The pulmonic valve was grossly normal. Pulmonic valve regurgitation is  mild.  11. The inferior vena cava is normal in size with greater than 50%  respiratory variability, suggesting right atrial pressure of 3 mmHg.    Recent Labs: No results found for requested labs within last 8760 hours.  Recent Lipid  Panel No results found for: CHOL, TRIG, HDL, CHOLHDL, VLDL, LDLCALC, LDLDIRECT  Physical Exam:    VS:  BP 110/80   Pulse 76   Ht 5\' 11"  (1.803 m)   Wt 176 lb (79.8 kg)   SpO2 98%   BMI 24.55 kg/m     Wt Readings from Last 3 Encounters:  10/24/19 176 lb (79.8 kg)  09/11/19 172 lb (78 kg)  09/02/19 172 lb (78 kg)     GEN:  Well nourished, well developed in no acute distress HEENT: Normal NECK: No JVD; No carotid bruits LYMPHATICS: No lymphadenopathy CARDIAC: Irregular, normal rate, no murmurs, rubs, gallops RESPIRATORY:  Clear to auscultation without rales, wheezing or rhonchi  ABDOMEN: Soft, non-tender, non-distended MUSCULOSKELETAL:  No edema; No deformity  SKIN: Warm and dry NEUROLOGIC:  Alert and oriented x 3 PSYCHIATRIC:  Normal affect   ASSESSMENT:    1. Atrial fibrillation, unspecified type (HCC)   2. Persistent atrial fibrillation (HCC)   3. Nonischemic cardiomyopathy (HCC)   4. Benign essential hypertension    PLAN:    In order of problems listed above:  Persistent atrial fibrillation -On exam today is irregular, normal rate. -On diltiazem 240, Toprol 50 -Continuing with Xarelto 20 -I would like for him to discuss potential ablative therapy with Dr. Johney [name] again.  I think given his age, it would be nice to have restoration of sinus rhythm.  Thankfully, under good rate control currently.  Also symptomatically improved.  Essential hypertension -On telmisartan 20 and medications as above.  Well-controlled today.  Cardiomyopathy -Possibly tachycardia mediated previously in November.  EF was 30 to 35%.  We will go ahead and repeat echocardiogram now that rate control is better.  Left hip pain -Deformity, meloxicam.  Watch for bleeding.  Nagging, tooth ache-like pain.  He has seen Dr. Katrinka Blazing with sports medicine.     Medication Adjustments/Labs and Tests Ordered: Current medicines are reviewed at length with the patient today.  Concerns regarding medicines  are outlined above.  Orders Placed This Encounter  Procedures  . ECHOCARDIOGRAM COMPLETE   No orders of the defined types were placed in this encounter.   Patient Instructions  Medication Instructions:  Your physician recommends that you continue on your current medications as directed. Please refer to the Current Medication list given to you today.  *If you need a refill on your cardiac medications before your next appointment, please call your pharmacy*  Lab Work: If you have labs (blood work) drawn today and your tests are completely normal, you will receive your results only by: Marland Kitchen MyChart Message (if you have MyChart) OR . A paper copy in the mail If you have any lab test that is abnormal or we need to change your treatment, we will call you to review the results.  Testing/Procedures: Your physician has requested that you have an echocardiogram. Echocardiography is a painless test that uses sound waves to create images of your heart. It provides your doctor with information about the size and shape of your heart and how well your heart's chambers and valves are working. This procedure takes approximately one hour. There are no restrictions for this procedure.  Follow-Up: Your physician recommends that you schedule a follow-up appointment in 1 to 2 months with Dr. Johney [name].  At [facility], you and your health needs are our priority.  As part of our continuing mission to provide you with exceptional heart care, we have created designated Provider Care Teams.  These Care Teams include your primary Cardiologist (physician) and Advanced Practice Providers (APPs -  Physician Assistants and Nurse Practitioners) who all work together to provide you with the care you need, when you need it.  We recommend signing up for the patient portal called "MyChart".  Sign up information is provided on this After Visit Summary.  MyChart is used to connect with patients for Virtual Visits (Telemedicine).   Patients are able to view lab/test results, encounter notes, upcoming appointments, etc.  Non-urgent messages can be sent to your provider as well.   To learn more about what you can do with MyChart, go to [email or web address].    Your next appointment:   6 month(s)  The format for your next appointment:   In Person  Provider:   You  may see Donato Schultz, MD or one of the following Advanced Practice Providers on your designated Care Team:    Norma Fredrickson, NP  Nada Boozer, NP  Georgie Chard, NP       Signed, Donato Schultz, MD  10/24/2019 8:34 AM    [facility]

## 2019-10-24 NOTE — Patient Instructions (Signed)
Medication Instructions:  Your physician recommends that you continue on your current medications as directed. Please refer to the Current Medication list given to you today.  *If you need a refill on your cardiac medications before your next appointment, please call your pharmacy*  Lab Work: If you have labs (blood work) drawn today and your tests are completely normal, you will receive your results only by: Marland Kitchen MyChart Message (if you have MyChart) OR . A paper copy in the mail If you have any lab test that is abnormal or we need to change your treatment, we will call you to review the results.  Testing/Procedures: Your physician has requested that you have an echocardiogram. Echocardiography is a painless test that uses sound waves to create images of your heart. It provides your doctor with information about the size and shape of your heart and how well your heart's chambers and valves are working. This procedure takes approximately one hour. There are no restrictions for this procedure.  Follow-Up: Your physician recommends that you schedule a follow-up appointment in 1 to 2 months with Dr. Johney Frame.  At [facility], you and your health needs are our priority.  As part of our continuing mission to provide you with exceptional heart care, we have created designated Provider Care Teams.  These Care Teams include your primary Cardiologist (physician) and Advanced Practice Providers (APPs -  Physician Assistants and Nurse Practitioners) who all work together to provide you with the care you need, when you need it.  We recommend signing up for the patient portal called "MyChart".  Sign up information is provided on this After Visit Summary.  MyChart is used to connect with patients for Virtual Visits (Telemedicine).  Patients are able to view lab/test results, encounter notes, upcoming appointments, etc.  Non-urgent messages can be sent to your provider as well.   To learn more about what you can  do with MyChart, go to [email or web address].    Your next appointment:   6 month(s)  The format for your next appointment:   In Person  Provider:   You may see Donato Schultz, MD or one of the following Advanced Practice Providers on your designated Care Team:    Norma Fredrickson, NP  Nada Boozer, NP  Georgie Chard, NP

## 2019-11-13 ENCOUNTER — Ambulatory Visit (HOSPITAL_COMMUNITY): Payer: BC Managed Care – PPO | Attending: Cardiovascular Disease

## 2019-11-13 ENCOUNTER — Other Ambulatory Visit: Payer: Self-pay

## 2019-11-13 DIAGNOSIS — I4891 Unspecified atrial fibrillation: Secondary | ICD-10-CM

## 2019-11-14 ENCOUNTER — Telehealth: Payer: Self-pay | Admitting: Cardiology

## 2019-11-14 NOTE — Telephone Encounter (Signed)
PT AWARE OF ECHO RESULTS./CY 

## 2019-11-14 NOTE — Telephone Encounter (Signed)
Patient is returning call to discuss results from echocardiogram completed on 11/13/19. Please call. 

## 2019-11-16 ENCOUNTER — Other Ambulatory Visit: Payer: Self-pay | Admitting: Family Medicine

## 2019-12-07 ENCOUNTER — Other Ambulatory Visit: Payer: Self-pay | Admitting: Family Medicine

## 2019-12-14 ENCOUNTER — Other Ambulatory Visit: Payer: Self-pay | Admitting: Family Medicine

## 2019-12-15 ENCOUNTER — Ambulatory Visit: Payer: BC Managed Care – PPO | Admitting: Internal Medicine

## 2019-12-18 ENCOUNTER — Other Ambulatory Visit: Payer: Self-pay

## 2019-12-18 MED ORDER — METOPROLOL SUCCINATE ER 50 MG PO TB24: 50.0000 mg | ORAL_TABLET | Freq: Every day | ORAL | 2 refills | Status: DC

## 2019-12-18 NOTE — Telephone Encounter (Signed)
Pt's medication was sent to pt's pharmacy as requested. Confirmation received.  °

## 2019-12-22 ENCOUNTER — Ambulatory Visit: Payer: BC Managed Care – PPO | Admitting: Internal Medicine

## 2020-01-12 ENCOUNTER — Other Ambulatory Visit: Payer: Self-pay | Admitting: Cardiology

## 2020-01-14 ENCOUNTER — Other Ambulatory Visit: Payer: Self-pay | Admitting: Family Medicine

## 2020-01-14 DIAGNOSIS — E038 Other specified hypothyroidism: Secondary | ICD-10-CM | POA: Diagnosis not present

## 2020-01-14 DIAGNOSIS — I10 Essential (primary) hypertension: Secondary | ICD-10-CM | POA: Diagnosis not present

## 2020-01-14 DIAGNOSIS — E063 Autoimmune thyroiditis: Secondary | ICD-10-CM | POA: Diagnosis not present

## 2020-01-14 DIAGNOSIS — E7800 Pure hypercholesterolemia, unspecified: Secondary | ICD-10-CM | POA: Diagnosis not present

## 2020-01-18 ENCOUNTER — Other Ambulatory Visit: Payer: Self-pay | Admitting: Family Medicine

## 2020-01-23 ENCOUNTER — Other Ambulatory Visit: Payer: Self-pay

## 2020-01-23 ENCOUNTER — Ambulatory Visit (INDEPENDENT_AMBULATORY_CARE_PROVIDER_SITE_OTHER): Payer: BC Managed Care – PPO | Admitting: Internal Medicine

## 2020-01-23 ENCOUNTER — Encounter: Payer: Self-pay | Admitting: Internal Medicine

## 2020-01-23 VITALS — BP 112/70 | HR 68 | Ht 71.0 in | Wt 173.0 lb

## 2020-01-23 DIAGNOSIS — E039 Hypothyroidism, unspecified: Secondary | ICD-10-CM | POA: Diagnosis not present

## 2020-01-23 DIAGNOSIS — I4819 Other persistent atrial fibrillation: Secondary | ICD-10-CM

## 2020-01-23 DIAGNOSIS — I10 Essential (primary) hypertension: Secondary | ICD-10-CM

## 2020-01-23 DIAGNOSIS — I428 Other cardiomyopathies: Secondary | ICD-10-CM

## 2020-01-23 DIAGNOSIS — I483 Typical atrial flutter: Secondary | ICD-10-CM

## 2020-01-23 MED ORDER — RIVAROXABAN 20 MG PO TABS: 20.0000 mg | ORAL_TABLET | Freq: Every day | ORAL | 11 refills | Status: DC

## 2020-01-23 NOTE — Progress Notes (Signed)
   PCP: Olive Bass, FNP Primary Cardiologist: Dr Anne Fu Primary EP: Dr Johney Frame  Walter Leisure, Walter [name] is a 44 y.o. male who presents today for routine electrophysiology followup.  Since last being seen in our clinic, the patient reports doing very well.  Today, he denies symptoms of palpitations, chest pain, shortness of breath,  lower extremity edema, dizziness, presyncope, or syncope.  The patient is otherwise without complaint today.   Past Medical History:  Diagnosis Date  . Cardiomyopathy (HCC)   . Hypothyroidism   . Persistent atrial fibrillation (HCC)   . Typical atrial flutter [facility])    Past Surgical History:  Procedure Laterality Date  . CARDIOVERSION N/A 10/01/2017   Procedure: CARDIOVERSION;  Surgeon: Lewayne Bunting, Walter [name];  Location: [facility] ENDOSCOPY;  Service: Cardiovascular;  Laterality: N/A;    ROS- all systems are reviewed and negatives except as per HPI above  Current Outpatient Medications  Medication Sig Dispense Refill  . diltiazem (TIAZAC) 240 MG 24 hr capsule Take 1 tablet by mouth daily. Please keep upcoming appt in May for future refills. Thank you 90 capsule 1  . meloxicam (MOBIC) 7.5 MG tablet TAKE 1 TABLET(7.5 MG) BY MOUTH DAILY 30 tablet 0  . metoprolol succinate (TOPROL-XL) 50 MG 24 hr tablet Take 1 tablet (50 mg total) by mouth daily. Take with or immediately following a meal. 90 tablet 2  . Multiple Vitamin (MULTIVITAMIN) capsule Take 1 capsule by mouth every evening.     [name] (SYSTANE OP) Place 4-5 drops into both eyes 2 (two) times daily.     . rivaroxaban (XARELTO) 20 MG TABS tablet Take 1 tablet (20 mg total) by mouth daily with supper. 30 tablet 11  . telmisartan (MICARDIS) 20 MG tablet TAKE 1 TABLET(20 MG) BY MOUTH DAILY 90 tablet 2  . Vitamin D, Ergocalciferol, (DRISDOL) 1.25 MG (50000 UNIT) CAPS capsule TAKE 1 CAPSULE BY MOUTH EVERY 7 DAYS 12 capsule 0   No current facility-administered medications for this visit.     Physical Exam: Vitals:   01/23/20 0832  Weight: 173 lb (78.5 kg)  Height: 5\' 11"  (1.803 m)    GEN- The patient is well appearing, alert and oriented x 3 today.   Head- normocephalic, atraumatic Eyes-  Sclera clear, conjunctiva pink Ears- hearing intact Oropharynx- clear Lungs- normal work of breathing Heart- irregular rate and rhythm  GI- soft   Extremities- no clubbing, cyanosis, or edema  Wt Readings from Last 3 Encounters:  01/23/20 173 lb (78.5 kg)  10/24/19 176 lb (79.8 kg)  09/11/19 172 lb (78 kg)    EKG tracing ordered today is personally reviewed and shows rate controlled afib  Assessment and Plan:  1. Persistent atrial fibrillation/ atrial flutter chads2vasc score is 2 (HTN, reduced EF) Resume xarelto I have advised ablation.  Given CASTLE AF, I think that this is appropriate.  We also discussed tikosyn, amiodarone and rate control  As options at length today. He will think about ablation and contract my office if he decides proceed.  He is not ready to commit to the procedure today  2. HTN Stable No change required today  3. Nonischemic cardiomyopathy/ chronic systolic dysfunction Stable No change required today    Risks, benefits and potential toxicities for medications prescribed and/or refilled reviewed with patient today.   09/13/19 Walter [name], [facility] 01/23/2020 8:37 AM

## 2020-01-23 NOTE — Patient Instructions (Addendum)
Medication Instructions:  Xarelto 20 mg  Take 1 tablet by month daily with supper.  *If you need a refill on your cardiac medications before your next appointment, please call your pharmacy*  Lab Work: None ordered.  If you have labs (blood work) drawn today and your tests are completely normal, you will receive your results only by: Marland Kitchen MyChart Message (if you have MyChart) OR . A paper copy in the mail If you have any lab test that is abnormal or we need to change your treatment, we will call you to review the results.  Testing/Procedures: None ordered.  Follow-Up: At [facility], you and your health needs are our priority.  As part of our continuing mission to provide you with exceptional heart care, we have created designated Provider Care Teams.  These Care Teams include your primary Cardiologist (physician) and Advanced Practice Providers (APPs -  Physician Assistants and Nurse Practitioners) who all work together to provide you with the care you need, when you need it.  We recommend signing up for the patient portal called "MyChart".  Sign up information is provided on this After Visit Summary.  MyChart is used to connect with patients for Virtual Visits (Telemedicine).  Patients are able to view lab/test results, encounter notes, upcoming appointments, etc.  Non-urgent messages can be sent to your provider as well.   To learn more about what you can do with MyChart, go to [email or web address].    Your next appointment:   Your physician wants you to follow-up in: as needed. Please call Inetta Fermo if you want to schedule an ablation with Dr. Johney Frame. [phone] or mychart message.    Other Instructions:

## 2020-03-17 DIAGNOSIS — E038 Other specified hypothyroidism: Secondary | ICD-10-CM | POA: Diagnosis not present

## 2020-03-17 DIAGNOSIS — E063 Autoimmune thyroiditis: Secondary | ICD-10-CM | POA: Diagnosis not present

## 2020-04-07 ENCOUNTER — Other Ambulatory Visit: Payer: Self-pay | Admitting: Cardiology

## 2020-04-29 ENCOUNTER — Other Ambulatory Visit: Payer: Self-pay | Admitting: Family Medicine

## 2020-05-03 ENCOUNTER — Telehealth (INDEPENDENT_AMBULATORY_CARE_PROVIDER_SITE_OTHER): Payer: BC Managed Care – PPO | Admitting: Cardiology

## 2020-05-03 ENCOUNTER — Encounter: Payer: Self-pay | Admitting: Cardiology

## 2020-05-03 ENCOUNTER — Other Ambulatory Visit: Payer: Self-pay

## 2020-05-03 ENCOUNTER — Telehealth: Payer: Self-pay | Admitting: *Deleted

## 2020-05-03 VITALS — BP 112/76 | Ht 71.0 in | Wt 169.0 lb

## 2020-05-03 DIAGNOSIS — I10 Essential (primary) hypertension: Secondary | ICD-10-CM | POA: Diagnosis not present

## 2020-05-03 DIAGNOSIS — I4819 Other persistent atrial fibrillation: Secondary | ICD-10-CM

## 2020-05-03 DIAGNOSIS — I428 Other cardiomyopathies: Secondary | ICD-10-CM

## 2020-05-03 MED ORDER — TELMISARTAN 20 MG PO TABS
ORAL_TABLET | ORAL | 3 refills | Status: DC
Start: 2020-05-03 — End: 2021-04-27

## 2020-05-03 NOTE — Patient Instructions (Signed)
Medication Instructions:  The current medical regimen is effective;  continue present plan and medications.  *If you need a refill on your cardiac medications before your next appointment, please call your pharmacy*  Follow-Up: At [facility], you and your health needs are our priority.  As part of our continuing mission to provide you with exceptional heart care, we have created designated Provider Care Teams.  These Care Teams include your primary Cardiologist (physician) and Advanced Practice Providers (APPs -  Physician Assistants and Nurse Practitioners) who all work together to provide you with the care you need, when you need it.  We recommend signing up for the patient portal called "MyChart".  Sign up information is provided on this After Visit Summary.  MyChart is used to connect with patients for Virtual Visits (Telemedicine).  Patients are able to view lab/test results, encounter notes, upcoming appointments, etc.  Non-urgent messages can be sent to your provider as well.   To learn more about what you can do with MyChart, go to [email or web address].    Your next appointment:   6 months   The format for your next appointment:   In person  Provider:   Dr Donato Schultz   Other Instructions Thank you for choosing [facility]!!

## 2020-05-03 NOTE — Progress Notes (Signed)
Virtual Visit via Telephone Note   This visit type was conducted due to national recommendations for restrictions regarding the COVID-19 Pandemic (e.g. social distancing) in an effort to limit this patient's exposure and mitigate transmission in our community.  Due to his co-morbid illnesses, this patient is at least at moderate risk for complications without adequate follow up.  This format is felt to be most appropriate for this patient at this time.  The patient did not have access to video technology/had technical difficulties with video requiring transitioning to audio format only (telephone).  All issues noted in this document were discussed and addressed.  No physical exam could be performed with this format.  Please refer to the patient's chart for his  consent to telehealth for [facility].    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  Prior note with Dr. Johney Frame reviewed.  Atrial fibrillation ablation discussed at length.  Overall he has been biking 10 miles a day, no chest pain no fevers chills nausea vomiting syncope bleeding.  Occasionally he will feel palpitations associated with his atrial fibrillation.  His blood pressure is controlled on current medications.  Recently has transitioned job positions.   Dr. Johney Frame clinic note reviewed from 12/2019. Not ready to commit to ablation which is recommended.   The patient does not have  symptoms concerning for COVID-19 infection (fever, chills, cough, or new shortness of breath).    Past Medical History:  Diagnosis Date  . Cardiomyopathy (HCC)   . Hypothyroidism   . Persistent atrial fibrillation (HCC)   . Typical atrial flutter [facility])    Past Surgical History:  Procedure Laterality Date  . CARDIOVERSION N/A 10/01/2017   Procedure: CARDIOVERSION;  Surgeon: Lewayne Bunting, MD;  Location: [facility] ENDOSCOPY;  Service: Cardiovascular;  Laterality: N/A;     Current Meds  Medication Sig  . diltiazem (TIAZAC) 240 MG 24 hr capsule TAKE ONE CAPSULE BY MOUTH DAILY  . levothyroxine (SYNTHROID) 100 MCG tablet Take 1 tablet by mouth daily.  . meloxicam (MOBIC) 7.5 MG tablet TAKE 1 TABLET(7.5 MG) BY MOUTH DAILY  . metoprolol succinate (TOPROL-XL) 50 MG 24 hr tablet Take 1 tablet (50 mg total) by mouth daily. Take with or immediately following a meal.  . Multiple Vitamin (MULTIVITAMIN) capsule Take 1 capsule by mouth every evening.   [name] (SYSTANE OP) Place 4-5 drops into both eyes 2 (two) times daily.   Marland Kitchen telmisartan (MICARDIS) 20 MG tablet TAKE 1 TABLET(20 MG) BY MOUTH DAILY  . [DISCONTINUED] telmisartan (MICARDIS) 20 MG tablet TAKE 1 TABLET(20 MG) BY MOUTH DAILY     Allergies:   Patient has no known allergies.   Social History   Tobacco Use  . Smoking status: Never Smoker  . Smokeless tobacco: Never Used  Vaping Use  . Vaping Use: Never used  Substance Use Topics  . Alcohol use: Yes    Comment: occ  . Drug use: No  Family Hx: The patient's family history includes Atrial fibrillation in his maternal grandmother. There is no history of Heart failure.  ROS:   Please see the history of present illness.     All other systems reviewed and are negative.   Prior CV studies:   The following studies were reviewed today:  ECHO 11/13/19:   1. Overall, EF appears slightly improved 40-45%. Intermittent Afib with  RVR makes accurate  assessment difficult. Would recommend to repeat study  after restoration of NSR.  2. Left ventricular ejection fraction, by estimation, is 40 to 45%. The  left ventricle has mildly decreased function. The left ventricle  demonstrates global hypokinesis. Left ventricular diastolic function could  not be evaluated.  3. Right ventricular systolic function is normal. The right ventricular  size is normal. Tricuspid regurgitation signal is inadequate for assessing  PA pressure.  4. The mitral valve is grossly normal. Mild mitral valve regurgitation.  No evidence of mitral stenosis.  5. The aortic valve is tricuspid. Aortic valve regurgitation is not  visualized. No aortic stenosis is present.  6. Aortic dilatation noted. There is mild dilatation of the aortic root  measuring 40 mm.  7. The inferior vena cava is normal in size with greater than 50%  respiratory variability, suggesting right atrial pressure of 3 mmHg.   Comparison(s): Changes from prior study are noted.   Labs/Other Tests and Data Reviewed:    EKG:  as reported 01/23/20: AFIB 68 bpm  Recent Labs: No results found for requested labs within last 8760 hours.   Recent Lipid Panel No results found for: CHOL, TRIG, HDL, CHOLHDL, LDLCALC, LDLDIRECT  Wt Readings from Last 3 Encounters:  05/03/20 169 lb (76.7 kg)  01/23/20 173 lb (78.5 kg)  10/24/19 176 lb (79.8 kg)     Risk Assessment/Calculations:     CHA2DS2-VASc Score =    This indicates a  % annual risk of stroke. The patient's score is based upon: CHF History: 1 HTN History: 1 Age Score: 0 Gender Score: 0      Objective:    Vital Signs:  BP 112/76   Ht 5\' 11"  (1.803 m)   Wt 169 lb (76.7 kg)   BMI 23.57 kg/m    Feels well, alert and oriented x3 in no distress, able to complete full sentences without difficulty  ASSESSMENT & PLAN:    Persistent atrial fibrillation -Continued recommendation for ablative therapy given his age, cardiomyopathy to  help restore normal rhythm.  Dr. note reviewed as above. -Continue with diltiazem for rate control.  Continue with metoprolol as well. -With his reduced ejection fraction and prior diagnosis of hypertension, chads Vasc is 2.  Xarelto has been prescribed however expense has been relayed.  He did fill the prescription and he would like to wait on taking it until 30 days prior to ablative therapy he states.  With these 2 risk factors we would encourage daily Xarelto use.  Dilated cardiomyopathy -On telmisartan and metoprolol. -NYHA class I. -Lowest EF was 30% in 2020, recently 40-45.  Essential hypertension -Well-controlled on current medication including telmisartan.   Shared Decision Making/Informed Consent        COVID-19 Education: The signs and symptoms of COVID-19 were discussed with the patient and how to seek care for testing (follow up with PCP or arrange E-visit).  The importance of social distancing was discussed today.  Time:   Today, I have spent 21 minutes with the patient with telehealth technology discussing the above  problems, review of medical records, review of echocardiogram, prior EKG, data.     Medication Adjustments/Labs and Tests Ordered: Current medicines are reviewed at length with the patient today.  Concerns regarding medicines are outlined above.   Tests Ordered: No orders of the defined types were placed in this encounter.   Medication Changes: Meds ordered this encounter  Medications  . telmisartan (MICARDIS) 20 MG tablet    Sig: TAKE 1 TABLET(20 MG) BY MOUTH DAILY    Dispense:  90 tablet    Refill:  3    Follow Up:  In Person in 6 month(s)  Signed, Donato Schultz, MD  05/03/2020 9:13 AM    [facility]

## 2020-05-03 NOTE — Telephone Encounter (Signed)
Patient Consent for Virtual Visit   Walter [name], you are scheduled for a virtual visit with your provider today.  Just as we do with appointments in the office, we must obtain your consent to participate.  Your consent will be active for this visit and any virtual visit you may have with one of our providers in the next 365 days.  If you have a MyChart account, I can also send a copy of this consent to you electronically.  All virtual visits are billed to your insurance company just like a traditional visit in the office.  As this is a virtual visit, video technology does not allow for your provider to perform a traditional examination.  This may limit your provider's ability to fully assess your condition.  If your provider identifies any concerns that need to be evaluated in person or the need to arrange testing such as labs, EKG, etc, we will make arrangements to do so.  Although advances in technology are sophisticated, we cannot ensure that it will always work on either your end or our end.  If the connection with a video visit is poor, we may have to switch to a telephone visit.  With either a video or telephone visit, we are not always able to ensure that we have a secure connection.   I need to obtain your verbal consent now.   Are you willing to proceed with your visit today?        :[id number]}  { {Click here.  Press F2 and choose YES or NO                  Yes   CONSENT FOR VIRTUAL VISIT FOR:  Walter Leisure, MD  By participating in this virtual visit I agree to the following:  I hereby voluntarily request, consent and authorize [facility] and its employed or contracted physicians, physician assistants, nurse practitioners or other licensed health care professionals (the Practitioner), to provide me with telemedicine health care services (the "Services") as deemed necessary by the treating Practitioner. I acknowledge and consent to receive the Services by the Practitioner via telemedicine. I  understand that the telemedicine visit will involve communicating with the Practitioner through live audiovisual communication technology and the disclosure of certain medical information by electronic transmission. I acknowledge that I have been given the opportunity to request an in-person assessment or other available alternative prior to the telemedicine visit and am voluntarily participating in the telemedicine visit.  I understand that I have the right to withhold or withdraw my consent to the use of telemedicine in the course of my care at any time, without affecting my right to future care or treatment, and that the Practitioner or I may terminate the telemedicine visit at any time. I understand that I have the right to inspect all information obtained and/or recorded in the course of the telemedicine visit and may receive copies of available information for a reasonable fee.  I understand that some of the potential risks of receiving the Services via telemedicine include:  Marland Kitchen Delay or interruption in medical evaluation due to technological equipment failure or disruption; . Information transmitted may not be sufficient (e.g. poor resolution of images) to allow for appropriate medical decision making by the Practitioner; and/or  . In rare instances, security protocols could fail, causing a breach of personal health information.  Furthermore, I acknowledge that it is my responsibility to provide information about my medical history, conditions and care that is complete  and accurate to the best of my ability. I acknowledge that Practitioner's advice, recommendations, and/or decision may be based on factors not within their control, such as incomplete or inaccurate data provided by me or distortions of diagnostic images or specimens that may result from electronic transmissions. I understand that the practice of medicine is not an exact science and that Practitioner makes no warranties or guarantees  regarding treatment outcomes. I acknowledge that a copy of this consent can be made available to me via my patient portal ([facility]), or I can request a printed copy by calling the office of [facility].    I understand that my insurance will be billed for this visit.   I have read or had this consent read to me. . I understand the contents of this consent, which adequately explains the benefits and risks of the Services being provided via telemedicine.  . I have been provided ample opportunity to ask questions regarding this consent and the Services and have had my questions answered to my satisfaction. . I give my informed consent for the services to be provided through the use of telemedicine in my medical care

## 2020-05-25 ENCOUNTER — Other Ambulatory Visit: Payer: Self-pay | Admitting: Family Medicine

## 2020-06-20 ENCOUNTER — Other Ambulatory Visit: Payer: Self-pay | Admitting: Cardiology

## 2021-04-02 ENCOUNTER — Other Ambulatory Visit: Payer: Self-pay | Admitting: Cardiology

## 2021-04-15 ENCOUNTER — Telehealth: Payer: Self-pay | Admitting: *Deleted

## 2021-04-15 DIAGNOSIS — Z01818 Encounter for other preprocedural examination: Secondary | ICD-10-CM

## 2021-04-15 DIAGNOSIS — I4819 Other persistent atrial fibrillation: Secondary | ICD-10-CM

## 2021-04-15 NOTE — Telephone Encounter (Signed)
   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 Ollen Gross Surgeon's Group or Practice Name:  [facility] Phone number:  [phone] ATTN: Aida Raider Fax number:  [phone]   Type of Clearance Requested: - Medical  - Pharmacy:  Hold Rivaroxaban (Xarelto)     Type of Anesthesia:   CHOICE   Additional requests/questions:   Elpidio Anis   04/15/2021, 4:30 PM

## 2021-04-18 NOTE — Telephone Encounter (Signed)
Left message for the pt to call back and schedule an appt for pre op clearance, pt will also needs BMET, CBC done for pre op. Appt can be with Dr. Anne Fu or APP, sometime in Jan/Feb 2023.Surgery is set for 08/15/21.

## 2021-04-18 NOTE — Telephone Encounter (Signed)
Please confirm what labs to order (BMET, CBC?)

## 2021-04-18 NOTE — Telephone Encounter (Signed)
Patient with diagnosis of A Fib on Xarelto for anticoagulation.  Patient's last visit was telemedicine on 05/03/20 with Dr Anne Fu  Procedure:  LEFT TOTAL HIP ARTHROPLASTY Date of procedure: 08/15/21   CHA2DS2-VASc Score = 2  This indicates a 2.2% annual risk of stroke. The patient's score is based upon: CHF History: 1 HTN History: 1 Diabetes History: 0 Stroke History: 0 Vascular Disease History: 0 Age Score: 0 Gender Score: 0  Patient is overdue for lab work.  Last chemistry panel in 2019  Patient would need updated lab work before clearance can be completed

## 2021-04-19 NOTE — Telephone Encounter (Signed)
I s/w the pt and he is agreeable to plan of care for pre op appt. Pt will see Jacolyn Reedy, [facility] 06/15/21 @ 8:45, same day Dr. Anne Fu is in the office. Pt thanked me for the call and the help. I will forward notes as FYI to surgeon's office pt has appt.

## 2021-04-27 ENCOUNTER — Other Ambulatory Visit: Payer: Self-pay

## 2021-04-27 MED ORDER — TELMISARTAN 20 MG PO TABS
ORAL_TABLET | ORAL | 0 refills | Status: DC
Start: 2021-04-27 — End: 2021-07-27

## 2021-06-08 NOTE — Progress Notes (Signed)
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 Johney Frame in the past, nonischemic cardiomyopathy ejection fraction improved to 40 to 45% on echo 10/2019, hypertension.  Patient last seen by Dr. Anne Fu 05/03/2020 and doing well  Patient on my schedule today for preop clearance for hip replacement by Dr. Lequita Halt, August 15, 2021.  Patient says he still has Afib 2-3 times daily. In Aflutter now but doesn't feel it. Rides his Peloton 6-9 miles daily without a problem. Denies chest pain, dyspnea, dizziness, presyncope. HR usually in the 70's and BP 120/80.     Past Medical History:  Diagnosis Date   Cardiomyopathy (HCC)    Hypothyroidism    Persistent atrial fibrillation (HCC)    Typical atrial flutter (HCC)     Past Surgical History:  Procedure Laterality Date   CARDIOVERSION N/A 10/01/2017   Procedure: CARDIOVERSION;  Surgeon: Lewayne Bunting, MD;  Location: [facility];  Service: Cardiovascular;  Laterality: N/A;    Current Medications: Current Meds  Medication Sig   diltiazem (TIAZAC) 240 MG 24 hr capsule TAKE ONE CAPSULE BY MOUTH DAILY   levothyroxine (SYNTHROID) 100 MCG tablet Take 1 tablet by mouth daily.   metoprolol succinate (TOPROL-XL) 50 MG 24 hr tablet TAKE 1 TABLET(50 MG) BY MOUTH DAILY WITH OR IMMEDIATELY FOLLOWING A MEAL   Polyethyl Glycol-Propyl Glycol (SYSTANE OP) Place 4-5 drops into both eyes 2 (two) times daily.    rivaroxaban (XARELTO) 20 MG TABS tablet Take 1 tablet (20 mg total) by  mouth daily with supper.   telmisartan (MICARDIS) 20 MG tablet TAKE 1 TABLET(20 MG) BY MOUTH DAILY. Please keep upcoming appt in January 2023 with Cardiologist before anymore refills. Thank you     Allergies:   Patient has no known allergies.   Social History   Socioeconomic History   Marital status: Married    Spouse name: Not on file   Number of children: Not on file   Years of education: Not on file   Highest education level: Not on file  Occupational History   Not on file  Tobacco Use   Smoking status: Never   Smokeless tobacco: Never  Vaping Use   Vaping Use: Never used  Substance and Sexual Activity   Alcohol use: Yes    Comment: occ   Drug use: No   Sexual activity: Not on file  Other Topics Concern   Not on file  Social History Narrative   Lives in [place] alone.  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   ROS:  Please see the history of present illness.    ROS All other systems reviewed and are negative.   PHYSICAL EXAM:   VS:  BP (!) 120/96    Pulse 84    Ht 5\' 11"  (1.803 m)    Wt 192 lb (87.1 kg)    SpO2 97%    BMI 26.78 kg/m   Physical Exam  GEN: Well nourished, well developed, in no acute distress  Neck: no JVD, carotid bruits, or masses Cardiac:RRR; no murmurs, rubs, or gallops  Respiratory:  clear to auscultation bilaterally, normal work of breathing GI: soft, nontender, nondistended, + BS Ext: without cyanosis, clubbing, or edema, Good distal pulses bilaterally Neuro:  Alert and Oriented x 3 Psych: euthymic mood, full affect  Wt Readings from Last 3 Encounters:   06/15/21 192 lb (87.1 kg)  05/03/20 169 lb (76.7 kg)  01/23/20 173 lb (78.5 kg)      Studies/Labs Reviewed:   EKG:  EKG is  ordered today.  The ekg ordered today demonstrates Aflutter  Recent Labs: No results found for requested labs within last 8760 hours.   Lipid Panel No results found for: CHOL, TRIG, HDL, CHOLHDL, VLDL, LDLCALC, LDLDIRECT  Additional studies/ records that were reviewed today include:  Echo 11/13/19 IMPRESSIONS     1. Overall, EF appears slightly improved 40-45%. Intermittent Afib with  RVR makes accurate assessment difficult. Would recommend to repeat study  after restoration of NSR.   2. Left ventricular ejection fraction, by estimation, is 40 to 45%. The  left ventricle has mildly decreased function. The left ventricle  demonstrates global hypokinesis. Left ventricular diastolic function could  not be evaluated.   3. Right ventricular systolic function is normal. The right ventricular  size is normal. Tricuspid regurgitation signal is inadequate for assessing  PA pressure.   4. The mitral valve is grossly normal. Mild mitral valve regurgitation.  No evidence of mitral stenosis.   5. The aortic valve is tricuspid. Aortic valve regurgitation is not  visualized. No aortic stenosis is present.   6. Aortic dilatation noted. There is mild dilatation of the aortic root  measuring 40 mm.   7. The inferior vena cava is normal in size with greater than 50%  respiratory variability, suggesting right atrial pressure of 3 mmHg.   Comparison(s): Changes from prior study are noted.    Risk Assessment/Calculations:    CHA2DS2-VASc Score = 2   This indicates a 2.2% annual risk of stroke. The patient's score is based upon: CHF History: 1 HTN History: 1 Diabetes History: 0 Stroke History: 0 Vascular Disease History: 0 Age Score: 0 Gender Score: 0        ASSESSMENT:    1. Preoperative clearance   2. Persistent atrial fibrillation ([place])   3.  Nonischemic cardiomyopathy ([place])   4. Benign essential hypertension      PLAN:  In order of problems listed above:  Preop clearance for total hip replacement 08/15/21. Patient with persistent Afib/flutter, NICM, normal GXT 2019 asymptomatic. Considering ablation after hip surgery. Very active METS >7, no ischemic testing needed prior to hip replacement. Starting Xarelto today for AFib-can hold 2 days prior to hip surgery.  According to the Revised Cardiac Risk Index (RCRI), his Perioperative Risk of Major Cardiac Event is (%): 0.9  His Functional Capacity in METs is: 7.25 according to the Duke Activity Status Index (DASI).   Persistent Afib-Dr. Allred discussed ablation in the past-refer back to EPS to reconsider ablation. CHADSVASC=2 so should be  anticoagulated.Discussed with [name] who agrees. Patient has Xarelto 20 mg once daily he has some that he has never taken so will start that. Hold 2 days prior to hip surgery.  NICM EF 40-45%-compensated, recheck echo after hip surgery.  HTN-up a little today but controlled at home.  Shared Decision Making/Informed Consent        Medication Adjustments/Labs and Tests Ordered: Current medicines are reviewed at length with the patient today.  Concerns regarding medicines are outlined above.  Medication changes, Labs and Tests ordered today are listed in the Patient Instructions below. Patient Instructions  Medication Instructions:  Your physician recommends that you continue on your current medications as directed. Please refer to the Current Medication list given to you today.  *If you need a refill on your cardiac medications before your next appointment, please call your pharmacy*   Lab Work: Fasting lipid at [facility] when you have your other labs drawn If you have labs (blood work) drawn today and your tests are completely normal, you will receive your results only by: [place] (if you have MyChart) OR A paper copy in the  mail If you have any lab test that is abnormal or we need to change your treatment, we will call you to review the results.    Follow-Up: At [facility], you and your health needs are our priority.  As part of our continuing mission to provide you with exceptional heart care, we have created designated Provider Care Teams.  These Care Teams include your primary Cardiologist (physician) and Advanced Practice Providers (APPs -  Physician Assistants and Nurse Practitioners) who all work together to provide you with the care you need, when you need it.  We recommend signing up for the patient portal called "MyChart".  Sign up information is provided on this After Visit Summary.  MyChart is used to connect with patients for Virtual Visits (Telemedicine).  Patients are able to view lab/test results, encounter notes, upcoming appointments, etc.  Non-urgent messages can be sent to your provider as well.   To learn more about what you can do with MyChart, go to [email or web address].    Your next appointment:   1 Year  The format for your next appointment:   In Person  Provider:   Candee Furbish, MD {      Signed, [name], PA-C  06/15/2021 12:31 PM    [address], [place], [place]  [address] Phone: [phone]; Fax: [phone]

## 2021-06-15 ENCOUNTER — Other Ambulatory Visit: Payer: Self-pay | Admitting: *Deleted

## 2021-06-15 ENCOUNTER — Ambulatory Visit: Payer: Managed Care, Other (non HMO) | Admitting: Physician Assistant

## 2021-06-15 ENCOUNTER — Other Ambulatory Visit: Payer: Self-pay

## 2021-06-15 ENCOUNTER — Encounter: Payer: Self-pay | Admitting: Physician Assistant

## 2021-06-15 VITALS — BP 120/96 | HR 84 | Ht 71.0 in | Wt 192.0 lb

## 2021-06-15 DIAGNOSIS — Z01818 Encounter for other preprocedural examination: Secondary | ICD-10-CM

## 2021-06-15 DIAGNOSIS — I428 Other cardiomyopathies: Secondary | ICD-10-CM | POA: Diagnosis not present

## 2021-06-15 DIAGNOSIS — I4819 Other persistent atrial fibrillation: Secondary | ICD-10-CM

## 2021-06-15 DIAGNOSIS — I10 Essential (primary) hypertension: Secondary | ICD-10-CM

## 2021-06-15 MED ORDER — RIVAROXABAN 20 MG PO TABS: 20.0000 mg | ORAL_TABLET | Freq: Every day | ORAL | 3 refills | Status: DC

## 2021-06-15 NOTE — Patient Instructions (Signed)
Medication Instructions:  Your physician recommends that you continue on your current medications as directed. Please refer to the Current Medication list given to you today.  *If you need a refill on your cardiac medications before your next appointment, please call your pharmacy*   Lab Work: Fasting lipid at [facility] when you have your other labs drawn If you have labs (blood work) drawn today and your tests are completely normal, you will receive your results only by: [place] (if you have MyChart) OR A paper copy in the mail If you have any lab test that is abnormal or we need to change your treatment, we will call you to review the results.    Follow-Up: At [facility], you and your health needs are our priority.  As part of our continuing mission to provide you with exceptional heart care, we have created designated Provider Care Teams.  These Care Teams include your primary Cardiologist (physician) and Advanced Practice Providers (APPs -  Physician Assistants and Nurse Practitioners) who all work together to provide you with the care you need, when you need it.  We recommend signing up for the patient portal called "MyChart".  Sign up information is provided on this After Visit Summary.  MyChart is used to connect with patients for Virtual Visits (Telemedicine).  Patients are able to view lab/test results, encounter notes, upcoming appointments, etc.  Non-urgent messages can be sent to your provider as well.   To learn more about what you can do with MyChart, go to [email or web address].    Your next appointment:   1 Year  The format for your next appointment:   In Person  Provider:   Candee Furbish, MD {

## 2021-06-16 NOTE — Addendum Note (Signed)
Addended by: Tarri Fuller on: 06/16/2021 08:45 AM   Modules accepted: Orders

## 2021-06-16 NOTE — Telephone Encounter (Signed)
Pt came in yesterday for preop visit. Had appt note to check BMET and CBC. Labs were not checked and still need to be.

## 2021-06-16 NOTE — Telephone Encounter (Signed)
I s/w the pt this morning in regard to that he was supposed to have lab work yesterday when he saw Jacolyn Reedy, [facility], (was noted in appt notes bmet, cbc for pre op). I did state that I saw that [facility] has also ordered for Lipids to be checked when he does have his pre op lab work. I do see the only lab order entered in was for the Lipid panel. I assured the pt that I will make sure all labs needed are entered in. Pt is needing BMET, CBC for pre op clearance. Jacolyn Reedy, PAC added on for the pt to have Lipids checked at the same time of other lab draw. Pt has asked if he can have this lab work done when he goes for his pre admission testing at [facility] 08/03/21; I assured the pt that is fine. Surgery is set for 07/2021 with Dr. Lequita Halt. Pt asked if I would make sure that [facility] is aware of the lab work that was ordered by our office. I assured the pt that I will notate the appt notes for labs. Pt thanked me for the call this morning and the help. I will update the pre op provider and the pharm-d as well. Will send FYI to Dr. Lequita Halt as well. Once the pt has been cleared we will be sure to fax over clearance notes to surgeon's office.

## 2021-06-19 ENCOUNTER — Other Ambulatory Visit: Payer: Self-pay | Admitting: Cardiology

## 2021-06-20 ENCOUNTER — Other Ambulatory Visit: Payer: Self-pay

## 2021-06-20 DIAGNOSIS — I4819 Other persistent atrial fibrillation: Secondary | ICD-10-CM

## 2021-06-20 DIAGNOSIS — I428 Other cardiomyopathies: Secondary | ICD-10-CM

## 2021-06-20 DIAGNOSIS — Z01818 Encounter for other preprocedural examination: Secondary | ICD-10-CM

## 2021-06-21 ENCOUNTER — Telehealth: Payer: Self-pay | Admitting: Physician Assistant

## 2021-06-21 NOTE — Telephone Encounter (Signed)
Called the pharmacy to make sure the metoprolol rx was received. Pharmacist stated the rx was ready for pick up.  Contacted the patient and left a message informing him that the rx was ready for pickip advised a call back with any questions or concerns.

## 2021-06-21 NOTE — Telephone Encounter (Signed)
New message    *STAT* If patient is at the pharmacy, call can be transferred to refill team.   1. Which medications need to be refilled? (please list name of each medication and dose if known) metoprolol 50 mg  2. Which pharmacy/location (including street and city if local pharmacy) is medication to be sent to? [facility] on [address]   3. Do they need a 30 day or 90 day supply? 30 day

## 2021-07-02 ENCOUNTER — Other Ambulatory Visit: Payer: Self-pay | Admitting: Cardiology

## 2021-07-25 NOTE — Progress Notes (Signed)
Surgery orders requested via Epic inbox. °

## 2021-07-27 ENCOUNTER — Other Ambulatory Visit: Payer: Self-pay | Admitting: Cardiology

## 2021-08-01 NOTE — Progress Notes (Signed)
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 Their medical history includes persistent atrial fibrillation, typical atrial flutter, nonischemic cardiomyopathy, and hypothyroidism.  He was last seen by Walter Barrios, PA-C on 06/15/2021 for preop clearance for total hip arthroplasty scheduled for 08/15/21. At that visit he reported 2-3 episodes of Afib daily. His EKG showed atrial flutter. He was started on Xarelto 20 mg, and referred to EP for consideration of ablation following hip surgery.  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 Marlou [name], who last saw him 05/03/2020. Previously seen by Dr. Rayann [name] 01/23/2020 to discuss ablation.  Overall, he appears well. Usually he can feel when he is out of rhythm. He notices palpitations/flutters but is otherwise asymptomatic.   Initially he was diagnosed with Afib about 5-6 years ago, controlled with medication. He reports 1 previous cardioversion that was successful for a few months. Lately he believes he has been in Atrial fibrillation for several months.  For exercise, he uses a Peloton bike daily. Lately he is physically limited by his hip pain rather than shortness of breath.  He works as a Designer, multimedia.  He denies any chest pain, or peripheral edema. No lightheadedness, headaches, syncope, orthopnea, or PND.  He sees an endocrinologist at [facility] for his thyroid.    Past Medical History:  Diagnosis Date   Cardiomyopathy ([place])    Hypothyroidism    Persistent atrial fibrillation (HCC)     Typical atrial flutter (HCC)     Past Surgical History:  Procedure Laterality Date   CARDIOVERSION N/A 10/01/2017   Procedure: CARDIOVERSION;  Surgeon: Lelon Perla, MD;  Location: [facility];  Service: Cardiovascular;  Laterality: N/A;    Current Medications: Current Meds  Medication Sig   diltiazem (TIAZAC) 240 MG 24 hr capsule TAKE 1 CAPSULE BY MOUTH DAILY   levothyroxine (SYNTHROID) 100 MCG tablet Take 100 mcg by mouth daily before breakfast.   metoprolol succinate (TOPROL-XL) 50 MG 24 hr tablet TAKE 1 TABLET(50 MG) BY MOUTH DAILY WITH OR IMMEDIATELY FOLLOWING A MEAL   naproxen sodium (ALEVE) 220 MG tablet Take 220 mg by mouth.   Polyethyl Glycol-Propyl Glycol (SYSTANE OP) Place 4-5 drops into both eyes 2 (two) times daily.    rivaroxaban (XARELTO) 20 MG TABS tablet Take 1 tablet (20 mg total) by mouth daily with supper. (Patient taking differently: Take 20 mg by mouth in the morning.)   telmisartan (MICARDIS) 20 MG tablet TAKE 1 TABLET BY MOUTH DAILY     Allergies:   Patient has no known allergies.   Social History   Socioeconomic History   Marital status: Married    Spouse name: Not on file   Number of children: Not on file   Years of education: Not on file   Highest education level: Not on file  Occupational History   Not on file  Tobacco Use   Smoking status: Never   Smokeless tobacco: Never  Vaping Use   Vaping Use: Never used  Substance and Sexual Activity  Alcohol use: Yes    Comment: occ   Drug use: No   Sexual activity: Not on file  Other Topics Concern   Not on file  Social History Narrative   Lives in [place] alone.  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 There is no history of Heart failure.  ROS:   Please see the history of present illness.    (+) Palpitations All other systems reviewed and are negative.  EKGs/Labs/Other Studies Reviewed:    The following studies were reviewed today:  Echo 11/13/2019:  1. Overall, EF appears slightly improved 40-45%. Intermittent Afib with  RVR makes accurate assessment difficult. Would recommend to repeat study  after restoration of NSR.   2. Left ventricular ejection fraction, by estimation, is 40 to 45%. The  left ventricle has mildly decreased function. The left ventricle  demonstrates global hypokinesis. Left ventricular diastolic function could  not be evaluated.   3. Right ventricular systolic function is normal. The right ventricular  size is normal. Tricuspid regurgitation signal is inadequate for assessing  PA pressure.   4. The mitral valve is grossly normal. Mild mitral valve regurgitation.  No evidence of mitral stenosis.   5. The aortic valve is tricuspid. Aortic valve regurgitation is not  visualized. No aortic stenosis is present.   6. Aortic dilatation noted. There is mild dilatation of the aortic root  measuring 40 mm.   7. The inferior vena cava is normal in size with greater than 50%  respiratory variability, suggesting right atrial pressure of 3 mmHg.   Comparison(s): Changes from prior study are noted.   ETT 11/05/2017: Blood pressure demonstrated a normal response to exercise. No evidence of ischemia. No evidence of QRS widening on flecanide. No adverse arrhythmias. Normal sinus rhythm.  EKG:   EKG is personally reviewed.  08/02/2021: Atrial fibrillation.  Ventricular rate 89 bpm.   Recent Labs: No results found for requested labs within last 8760 hours.   Recent Lipid Panel No results found for: CHOL, TRIG, HDL, CHOLHDL, VLDL, LDLCALC, LDLDIRECT  Physical  Exam:    VS:  BP 124/68    Pulse 89    Ht 5\' 11"  (1.803 m)    Wt 183 lb (83 kg)    SpO2 99%    BMI 25.52 kg/m     Wt Readings from Last 3 Encounters:  08/02/21 183 lb (83 kg)  06/15/21 192 lb (87.1 kg)  05/03/20 169 lb (76.7 kg)     GEN: Well nourished, well developed in no acute distress HEENT: Normal NECK: No JVD; No carotid bruits LYMPHATICS: No lymphadenopathy CARDIAC: Irregularly irregular, no murmurs, rubs, gallops RESPIRATORY:  Clear to auscultation without rales, wheezing or rhonchi  ABDOMEN: Soft, non-tender, non-distended MUSCULOSKELETAL:  No edema; No deformity  SKIN: Warm and dry NEUROLOGIC:  Alert and oriented x 3 PSYCHIATRIC:  Normal affect       ASSESSMENT:    1. Persistent atrial fibrillation ([place])   2. Typical atrial flutter ([place])   3. Nonischemic cardiomyopathy ([place])   4. Primary hypertension   5. Pre-op evaluation  PLAN:    In order of problems listed above:  #Persistent atrial fibrillation Symptomatic.  Also in the setting of a nonischemic cardiomyopathy.  A rhythm control strategy is indicated.  He takes Xarelto for stroke prophylaxis.  I discussed treatment options available to him including antiarrhythmic drugs and catheter ablation.  Given his young age, I think an ablation strategy is reasonable.  I discussed the catheter ablation procedure in detail with the patient include the risk, recovery and likelihood of success.  I discussed the possibility of needing future ablation procedures or antiarrhythmic drugs.  He would like to proceed with scheduling.  I will plan to get an echocardiogram before the ablation procedure.  He will also need a CT scan.  Risk, benefits, and alternatives to EP study and radiofrequency ablation for afib were also discussed in detail today. These risks include but are not limited to stroke, bleeding, vascular damage, tamponade, perforation, damage to the esophagus, lungs, and other structures, pulmonary vein stenosis,  worsening renal function, and death. The patient understands these risk and wishes to proceed.  We will therefore proceed with catheter ablation at the next available time.  Carto, ICE, anesthesia are requested for the procedure.  Will also obtain CT PV protocol prior to the procedure to exclude LAA thrombus and further evaluate atrial anatomy.  Ablation strategy will be PVI plus posterior wall plus CTI.  #Chronic systolic heart failure Nonischemic.  NYHA class I-II.  Warm and dry on exam.  Active.  I suspect there is an element of tachycardia mediated cardiomyopathy.  Rhythm control indicated as above.  I would like to get him off the calcium channel blocker after his ablation procedure.  We will then plan to transition his ARB to [name] if the ejection fraction is still reduced.  Continue metoprolol.  #Hypertension Controlled today.  Continue telmisartan, metoprolol and diltiazem.   Total time spent with patient today 60 minutes. This includes reviewing records, evaluating the patient and coordinating care.  Medication Adjustments/Labs and Tests Ordered: Current medicines are reviewed at length with the patient today.  Concerns regarding medicines are outlined above.  Orders Placed This Encounter  Procedures   CT CARDIAC MORPH/PULM VEIN W/CM&W/O CA SCORE   CBC w/Diff   Basic Metabolic Panel (BMET)   EKG 12-Lead   No orders of the defined types were placed in this encounter.   I,Mathew Stumpf,acting as a Education administrator for Vickie Epley, MD.,have documented all relevant documentation on the behalf of Vickie Epley, MD,as directed by  Vickie Epley, MD while in the presence of Vickie Epley, MD.  I, Vickie Epley, MD, have reviewed all documentation for this visit. The documentation on 08/02/21 for the exam, diagnosis, procedures, and orders are all accurate and complete.   Signed, Hilton Cork. Quentin Ore, MD, [facility], [facility] 08/02/2021 7:47 PM    Electrophysiology [facility]

## 2021-08-02 ENCOUNTER — Ambulatory Visit: Payer: Managed Care, Other (non HMO) | Admitting: Cardiology

## 2021-08-02 ENCOUNTER — Encounter: Payer: Self-pay | Admitting: Cardiology

## 2021-08-02 ENCOUNTER — Encounter (INDEPENDENT_AMBULATORY_CARE_PROVIDER_SITE_OTHER): Payer: Self-pay

## 2021-08-02 ENCOUNTER — Encounter: Payer: Self-pay | Admitting: *Deleted

## 2021-08-02 ENCOUNTER — Other Ambulatory Visit: Payer: Self-pay

## 2021-08-02 VITALS — BP 124/68 | HR 89 | Ht 71.0 in | Wt 183.0 lb

## 2021-08-02 DIAGNOSIS — I483 Typical atrial flutter: Secondary | ICD-10-CM | POA: Diagnosis not present

## 2021-08-02 DIAGNOSIS — I428 Other cardiomyopathies: Secondary | ICD-10-CM | POA: Diagnosis not present

## 2021-08-02 DIAGNOSIS — Z01818 Encounter for other preprocedural examination: Secondary | ICD-10-CM

## 2021-08-02 DIAGNOSIS — I4819 Other persistent atrial fibrillation: Secondary | ICD-10-CM

## 2021-08-02 DIAGNOSIS — I10 Essential (primary) hypertension: Secondary | ICD-10-CM

## 2021-08-02 NOTE — Patient Instructions (Addendum)
DUE TO COVID-19 ONLY ONE VISITOR IS ALLOWED TO COME WITH YOU AND STAY IN THE WAITING ROOM ONLY DURING PRE OP AND PROCEDURE.   **NO VISITORS ARE ALLOWED IN THE SHORT STAY AREA OR RECOVERY ROOM!!**  IF YOU WILL BE ADMITTED INTO THE HOSPITAL YOU ARE ALLOWED ONLY TWO SUPPORT PEOPLE DURING VISITATION HOURS ONLY (7 AM -8PM)   The support person(s) must pass our screening, gel in and out, and wear a mask at all times, including in the patients room. Patients must also wear a mask when staff or their support person are in the room. Visitors GUEST BADGE MUST BE WORN VISIBLY  One adult visitor may remain with you overnight and MUST be in the room by 8 P.M.  No visitors under the age of 74. Any visitor under the age of 43 must be accompanied by an adult.    COVID SWAB TESTING MUST BE COMPLETED ON: 08/11/21     (*ARRIVE AT YOUR APPOINTMENT TIME STAFF IS NOT HERE BEFORE 8AM!!!*)    Site: [facility] [address] [place] Enter: Main Entrance have a seat in the waiting area to the right of main entrance (DO NOT [place]!!!!!) Dial: [phone] to alert staff you have arrived  You are not required to quarantine, however you are required to wear a well-fitted mask when you are out and around people not in your household.  Hand Hygiene often Do NOT share personal items Notify your provider if you are in close contact with someone who has COVID or you develop fever 100.4 or greater, new onset of sneezing, cough, sore throat, shortness of breath or body aches.  [address], [address], must go inside of the hospital, NOT A DRIVE THRU!  (Must self quarantine after testing. Follow instructions on handout.)       Your procedure is scheduled on: 08/15/21   Report to [facility] Main Entrance    Report to admitting at : 11:00 AM   Call this number if you have problems the morning of surgery  [phone]   Do not eat food :After Midnight.   After Midnight you may have the following liquids until : 10:45 AM  DAY OF SURGERY  Water Black Coffee (sugar ok, NO MILK/CREAM OR CREAMERS)  Tea (sugar ok, NO MILK/CREAM OR CREAMERS) regular and decaf                             Plain Jell-O (NO RED)                                           Fruit ices (not with fruit pulp, NO RED)                                     Popsicles (NO RED)  Juice: apple, WHITE grape, WHITE cranberry Sports drinks like Gatorade (NO RED) Clear broth(vegetable,chicken,beef)       Drink  Ensure drink AT: 10:45 AM the day of surgery surgery.     The day of surgery:  Drink ONE (1) Pre-Surgery Clear Ensure or G2 at AM the morning of surgery. Drink in one sitting. Do not sip.  This drink was given to you during your hospital  pre-op appointment visit. Nothing else to drink after completing the  Pre-Surgery Clear Ensure or G2.          If you have questions, please contact your surgeons office    Oral Hygiene is also important to reduce your risk of infection.                                    Remember - BRUSH YOUR TEETH THE MORNING OF SURGERY WITH YOUR REGULAR TOOTHPASTE   Do NOT smoke after Midnight   Take these medicines the morning of surgery with A SIP OF WATER: metoprolol,diltiazem,synthroid.  DO NOT TAKE ANY ORAL DIABETIC MEDICATIONS DAY OF YOUR SURGERY  Bring CPAP mask and tubing day of surgery.                              You may not have any metal on your body including hair pins, jewelry, and body piercing             Do not wear lotions, powders, perfumes/cologne, or deodorant               Men may shave face and neck.   Do not bring valuables to the hospital. [place].   Contacts, dentures or bridgework may not be worn into surgery.   Bring small overnight bag day of  surgery.    Patients discharged on the day of surgery will not be allowed to drive home.  Someone NEEDS to stay with you for the first 24 hours after anesthesia.   Special Instructions: Bring a copy of your healthcare power of attorney and living will documents         the day of surgery if you haven't scanned them before.              Please read over the following fact sheets you were given: IF YOU HAVE QUESTIONS ABOUT YOUR PRE-OP INSTRUCTIONS PLEASE CALL [phone]     [facility] - Preparing for Surgery Before surgery, you can play an important role.  Because skin is not sterile, your skin needs to be as free of germs as possible.  You can reduce the number of germs on your skin by washing with CHG (chlorahexidine gluconate) soap before surgery.  CHG is an antiseptic cleaner which kills germs and bonds with the skin to continue killing germs even after washing. Please DO NOT use if you have an allergy to CHG or antibacterial soaps.  If your skin becomes reddened/irritated stop using the CHG and inform your nurse when you arrive at Short Stay. Do not shave (including legs and underarms) for at least 48 hours prior to the first CHG shower.  You may shave your face/neck. Please follow these instructions carefully:  1.  Shower with CHG Soap the night before surgery and  the  morning of Surgery.  2.  If you choose to wash your hair, wash your hair first as usual with your  normal  shampoo.  3.  After you shampoo, rinse your hair and body thoroughly to remove the  shampoo.                           4.  Use CHG as you would any other liquid soap.  You can apply chg directly  to the skin and wash                       Gently with a scrungie or clean washcloth.  5.  Apply the CHG Soap to your body ONLY FROM THE NECK DOWN.   Do not use on face/ open                           Wound or open sores. Avoid contact with eyes, ears mouth and genitals (private parts).                       Wash face,  Genitals  (private parts) with your normal soap.             6.  Wash thoroughly, paying special attention to the area where your surgery  will be performed.  7.  Thoroughly rinse your body with warm water from the neck down.  8.  DO NOT shower/wash with your normal soap after using and rinsing off  the CHG Soap.                9.  Pat yourself dry with a clean towel.            10.  Wear clean pajamas.            11.  Place clean sheets on your bed the night of your first shower and do not  sleep with pets. Day of Surgery : Do not apply any lotions/deodorants the morning of surgery.  Please wear clean clothes to the hospital/surgery center.  FAILURE TO FOLLOW THESE INSTRUCTIONS MAY RESULT IN THE CANCELLATION OF YOUR SURGERY PATIENT SIGNATURE_________________________________  NURSE SIGNATURE__________________________________  ________________________________________________________________________   Adam Phenix  An incentive spirometer is a tool that can help keep your lungs clear and active. This tool measures how well you are filling your lungs with each breath. Taking long deep breaths may help reverse or decrease the chance of developing breathing (pulmonary) problems (especially infection) following: A long period of time when you are unable to move or be active. BEFORE THE PROCEDURE  If the spirometer includes an indicator to show your best effort, your nurse or respiratory therapist will set it to a desired goal. If possible, sit up straight or lean slightly forward. Try not to slouch. Hold the incentive spirometer in an upright position. INSTRUCTIONS FOR USE  Sit on the edge of your bed if possible, or sit up as far as you can in bed or on a chair. Hold the incentive spirometer in an upright position. Breathe out normally. Place the mouthpiece in your mouth and seal your lips tightly around it. Breathe in slowly and as deeply as possible, raising the piston or the ball toward the  top of the column. Hold your breath for 3-5 seconds or for as long as possible. Allow the piston or ball to fall  to the bottom of the column. Remove the mouthpiece from your mouth and breathe out normally. Rest for a few seconds and repeat Steps 1 through 7 at least 10 times every 1-2 hours when you are awake. Take your time and take a few normal breaths between deep breaths. The spirometer may include an indicator to show your best effort. Use the indicator as a goal to work toward during each repetition. After each set of 10 deep breaths, practice coughing to be sure your lungs are clear. If you have an incision (the cut made at the time of surgery), support your incision when coughing by placing a pillow or rolled up towels firmly against it. Once you are able to get out of bed, walk around indoors and cough well. You may stop using the incentive spirometer when instructed by your caregiver.  RISKS AND COMPLICATIONS Take your time so you do not get dizzy or light-headed. If you are in pain, you may need to take or ask for pain medication before doing incentive spirometry. It is harder to take a deep breath if you are having pain. AFTER USE Rest and breathe slowly and easily. It can be helpful to keep track of a log of your progress. Your caregiver can provide you with a simple table to help with this. If you are using the spirometer at home, follow these instructions: [place] IF:  You are having difficultly using the spirometer. You have trouble using the spirometer as often as instructed. Your pain medication is not giving enough relief while using the spirometer. You develop fever of 100.5 F (38.1 C) or higher. SEEK IMMEDIATE MEDICAL CARE IF:  You cough up bloody sputum that had not been present before. You develop fever of 102 F (38.9 C) or greater. You develop worsening pain at or near the incision site. MAKE SURE YOU:  Understand these instructions. Will watch your  condition. Will get help right away if you are not doing well or get worse. Document Released: 09/25/2006 Document Revised: 08/07/2011 Document Reviewed: 11/26/2006 [facility] Patient Information 2014 [place], [place].   ________________________________________________________________________

## 2021-08-02 NOTE — Patient Instructions (Addendum)
Medication Instructions:  ?Your physician recommends that you continue on your current medications as directed. Please refer to the Current Medication list given to you today. ?*If you need a refill on your cardiac medications before your next appointment, please call your pharmacy* ? ?Lab Work: ?None. ?If you have labs (blood work) drawn today and your tests are completely normal, you will receive your results only by: ?MyChart Message (if you have MyChart) OR ?A paper copy in the mail ?If you have any lab test that is abnormal or we need to change your treatment, we will call you to review the results. ? ?Testing/Procedures: Please move echo that is currently scheduled to sometime in the next 4 weeks.  ?Your physician has requested that you have an echocardiogram. Echocardiography is a painless test that uses sound waves to create images of your heart. It provides your doctor with information about the size and shape of your heart and how well your heart?s chambers and valves are working. This procedure takes approximately one hour. There are no restrictions for this procedure.  ? ?Your physician has requested that you have cardiac CT. Cardiac computed tomography (CT) is a painless test that uses an x-ray machine to take clear, detailed pictures of your heart. For further information please visit [email or web address] Please follow instruction sheet as given. ?  ?Your physician has recommended that you have an ablation. Catheter ablation is a medical procedure used to treat some cardiac arrhythmias (irregular heartbeats). During catheter ablation, a long, thin, flexible tube is put into a blood vessel in your groin (upper thigh), or neck. This tube is called an ablation catheter. It is then guided to your heart through the blood vessel. Radio frequency waves destroy small areas of heart tissue where abnormal heartbeats may cause an arrhythmia to start. Please see the instruction sheet given to you  today. ? ? ?Follow-Up: ?At [facility], you and your health needs are our priority.  As part of our continuing mission to provide you with exceptional heart care, we have created designated Provider Care Teams.  These Care Teams include your primary Cardiologist (physician) and Advanced Practice Providers (APPs -  Physician Assistants and Nurse Practitioners) who all work together to provide you with the care you need, when you need it. ? ?Your physician wants you to follow-up in: See instruction letter. ? ?We recommend signing up for the patient portal called "MyChart".  Sign up information is provided on this After Visit Summary.  MyChart is used to connect with patients for Virtual Visits (Telemedicine).  Patients are able to view lab/test results, encounter notes, upcoming appointments, etc.  Non-urgent messages can be sent to your provider as well.   ?To learn more about what you can do with MyChart, go to [email or web address].   ? ?Any Other Special Instructions Will Be Listed Below (If Applicable). ? ?Cardiac Ablation ?Cardiac ablation is a procedure to destroy (ablate) some heart tissue that is sending bad signals. These bad signals cause problems in heart rhythm. ?The heart has many areas that make these signals. If there are problems in these areas, they can make the heart beat in a way that is not normal. Destroying some tissues can help make the heart rhythm normal. ?Tell your doctor about: ?Any allergies you have. ?All medicines you are taking. These include vitamins, herbs, eye drops, creams, and over-the-counter medicines. ?Any problems you or family members have had with medicines that make you fall asleep (anesthetics). ?Any blood disorders you have. ?Any surgeries  you have had. ?Any medical conditions you have, such as kidney failure. ?Whether you are pregnant or may be pregnant. ?What are the risks? ?This is a safe procedure. But problems may occur, including: ?Infection. ?Bruising and  bleeding. ?Bleeding into the chest. ?Stroke or blood clots. ?Damage to nearby areas of your body. ?Allergies to medicines or dyes. ?The need for a pacemaker if the normal system is damaged. ?Failure of the procedure to treat the problem. ?What happens before the procedure? ?Medicines ?Ask your doctor about: ?Changing or stopping your normal medicines. This is important. ?Taking aspirin and ibuprofen. Do not take these medicines unless your doctor tells you to take them. ?Taking other medicines, vitamins, herbs, and supplements. ?General instructions ?Follow instructions from your doctor about what you cannot eat or drink. ?Plan to have someone take you home from the hospital or clinic. ?If you will be going home right after the procedure, plan to have someone with you for 24 hours. ?Ask your doctor what steps will be taken to prevent infection. ?What happens during the procedure? ? ?An IV tube will be put into one of your veins. ?You will be given a medicine to help you relax. ?The skin on your neck or groin will be numbed. ?A cut (incision) will be made in your neck or groin. A needle will be put through your cut and into a large vein. ?A tube (catheter) will be put into the needle. The tube will be moved to your heart. ?Dye may be put through the tube. This helps your doctor see your heart. ?Small devices (electrodes) on the tube will send out signals. ?A type of energy will be used to destroy some heart tissue. ?The tube will be taken out. ?Pressure will be held on your cut. This helps stop bleeding. ?A bandage will be put over your cut. ?The exact procedure may vary among doctors and hospitals. ?What happens after the procedure? ?You will be watched until you leave the hospital or clinic. This includes checking your heart rate, breathing rate, oxygen, and blood pressure. ?Your cut will be watched for bleeding. You will need to lie still for a few hours. ?Do not drive for 24 hours or as long as your doctor tells  you. ?Summary ?Cardiac ablation is a procedure to destroy some heart tissue. This is done to treat heart rhythm problems. ?Tell your doctor about any medical conditions you may have. Tell him or her about all medicines you are taking to treat them. ?This is a safe procedure. But problems may occur. These include infection, bruising, bleeding, and damage to nearby areas of your body. ?Follow what your doctor tells you about food and drink. You may also be told to change or stop some of your medicines. ?After the procedure, do not drive for 24 hours or as long as your doctor tells you. ?This information is not intended to replace advice given to you by your health care provider. Make sure you discuss any questions you have with your health care provider. ?Document Revised: 04/17/2019 Document Reviewed: 04/17/2019 ?Elsevier Patient Education ? 2022 Elsevier Inc. ? ? ? ?  ? ? ?

## 2021-08-03 ENCOUNTER — Other Ambulatory Visit: Payer: Self-pay

## 2021-08-03 ENCOUNTER — Encounter (HOSPITAL_COMMUNITY): Payer: Self-pay

## 2021-08-03 ENCOUNTER — Encounter (HOSPITAL_COMMUNITY)
Admission: RE | Admit: 2021-08-03 | Discharge: 2021-08-03 | Disposition: A | Discharge status: Home / Self Care | Payer: Managed Care, Other (non HMO) | Source: Ambulatory Visit | Attending: Orthopedic Surgery | Admitting: Orthopedic Surgery

## 2021-08-03 VITALS — BP 124/97 | HR 88 | Temp 97.8°F | Ht 71.0 in | Wt 191.0 lb

## 2021-08-03 DIAGNOSIS — M1612 Unilateral primary osteoarthritis, left hip: Secondary | ICD-10-CM | POA: Insufficient documentation

## 2021-08-03 DIAGNOSIS — Z01812 Encounter for preprocedural laboratory examination: Secondary | ICD-10-CM | POA: Insufficient documentation

## 2021-08-03 DIAGNOSIS — Z01818 Encounter for other preprocedural examination: Secondary | ICD-10-CM

## 2021-08-03 HISTORY — DX: Unspecified osteoarthritis, unspecified site: M19.90

## 2021-08-03 HISTORY — DX: Cardiac arrhythmia, unspecified: I49.9

## 2021-08-03 LAB — COMPREHENSIVE METABOLIC PANEL
ALT: 16 U/L (ref 0–44)
AST: 21 U/L (ref 15–41)
Albumin: 4.3 g/dL (ref 3.5–5.0)
Alkaline Phosphatase: 46 U/L (ref 38–126)
Anion gap: 6 (ref 5–15)
BUN: 12 mg/dL (ref 6–20)
CO2: 22 mmol/L (ref 22–32)
Calcium: 8.7 mg/dL — ABNORMAL LOW (ref 8.9–10.3)
Chloride: 104 mmol/L (ref 98–111)
Creatinine, Ser: 0.92 mg/dL (ref 0.61–1.24)
GFR, Estimated: >60 mL/min (ref >60)
Glucose, Bld: 107 mg/dL — ABNORMAL HIGH (ref 70–99)
Potassium: 4.7 mmol/L (ref 3.5–5.1)
Sodium: 132 mmol/L — ABNORMAL LOW (ref 135–145)
Total Bilirubin: 0.5 mg/dL (ref 0.3–1.2)
Total Protein: 7 g/dL (ref 6.5–8.1)

## 2021-08-03 LAB — TYPE AND SCREEN
ABO/RH(D): O POS
Antibody Screen: NEGATIVE

## 2021-08-03 LAB — CBC
HCT: 33.2 % — ABNORMAL LOW (ref 39.0–52.0)
Hemoglobin: 11.1 g/dL — ABNORMAL LOW (ref 13.0–17.0)
MCH: 32 pg (ref 26.0–34.0)
MCHC: 33.4 g/dL (ref 30.0–36.0)
MCV: 95.7 fL (ref 80.0–100.0)
Platelets: 288 10*3/uL (ref 150–400)
RBC: 3.47 MIL/uL — ABNORMAL LOW (ref 4.22–5.81)
RDW: 13.1 % (ref 11.5–15.5)
WBC: 4.3 10*3/uL (ref 4.0–10.5)
nRBC: 0 % (ref 0.0–0.2)

## 2021-08-03 LAB — SURGICAL PCR SCREEN
MRSA, PCR: NEGATIVE
Staphylococcus aureus: NEGATIVE

## 2021-08-03 NOTE — Progress Notes (Addendum)
For Short Stay: ?COVID SWAB appointment date: 08/11/21 @ 8:15 AM ?Date of COVID positive in last 90 days: N/A ?COVID Vaccine: Pfizer x 3 2021 ?Bowel Prep reminder: N/A ? ? ?For Anesthesia: ?PCP - FNP: Ria Clock ?Cardiologist - Dr. Donato Schultz. Clearance: Michele Lenze:PA: 06/15/21: Epic/Chart ? ?Chest x-ray -  ?EKG - 06/15/21 ?Stress Test -  ?ECHO - 11/13/19 ?Cardiac Cath -  ?Pacemaker/ICD device last checked: ?Pacemaker orders received: ?Device Rep notified: ? ?Spinal Cord Stimulator: ? ?Sleep Study -  ?CPAP -  ? ?Fasting Blood Sugar -  ?Checks Blood Sugar _____ times a day ?Date and result of last Hgb A1c- ? ?Blood Thinner Instructions: Xarelto: Will be held on 08/12/21 as per Dr. Despina Hick instructions ?Aspirin Instructions: ?Last Dose: ? ?Activity level: Can go up a flight of stairs and activities of daily living without stopping and without chest pain and/or shortness of breath ?  Able to exercise without chest pain and/or shortness of breath ?  Unable to go up a flight of stairs without chest pain and/or shortness of breath ?   ? ?Anesthesia review: Hx: Afib,cardiomyopathy. ? ?Patient denies shortness of breath, fever, cough and chest pain at PAT appointment ? ? ?Patient verbalized understanding of instructions that were given to them at the PAT appointment. Patient was also instructed that they will need to review over the PAT instructions again at home before surgery.  ?

## 2021-08-04 NOTE — Telephone Encounter (Signed)
Pre-op labs checked yesterday. Plt stable at 288K, CrCl > 148mL/min. ? ?He can hold Xarelto for 3 days prior to left THA on 3/20. ?

## 2021-08-04 NOTE — Telephone Encounter (Signed)
? ?  Primary Cardiologist: Candee Furbish, MD ? ?Chart reviewed as part of pre-operative protocol coverage. Given past medical history and time since last visit, based on ACC/AHA guidelines, Walter Cutter, MD would be at acceptable risk for the planned procedure without further cardiovascular testing.  ? ?Patient with diagnosis of A Fib on Xarelto for anticoagulation. ?  ?Patient's last visit was telemedicine on 05/03/20 with Dr Marlou Porch ?  ?Procedure:  LEFT TOTAL HIP ARTHROPLASTY ?Date of procedure: 08/15/21 ?  ?  ?CHA2DS2-VASc Score = 2  ?This indicates a 2.2% annual risk of stroke. ?The patient's score is based upon: ?CHF History: 1 ?HTN History: 1 ?Diabetes History: 0 ?Stroke History: 0 ?Vascular Disease History: 0 ?Age Score: 0 ?Gender Score: 0 ? ?Plt stable at 288K, CrCl > 160mL/min. ?  ?He can hold Xarelto for 3 days prior to left THA on 3/20. ? ? ?I will route this recommendation to the requesting party via Epic fax function and remove from pre-op pool. ? ?Please call with questions. ? ?Walter [name]. Walter Mussa NP-C ? ?  ?08/04/2021, 7:40 AM ?[place] ?[address] ?Office [phone] Fax [phone] ? ? ? ? ?

## 2021-08-10 ENCOUNTER — Other Ambulatory Visit (HOSPITAL_COMMUNITY): Payer: Managed Care, Other (non HMO)

## 2021-08-11 ENCOUNTER — Encounter (HOSPITAL_COMMUNITY): Payer: Managed Care, Other (non HMO)

## 2021-08-12 ENCOUNTER — Ambulatory Visit (HOSPITAL_COMMUNITY): Payer: Managed Care, Other (non HMO) | Attending: Cardiology

## 2021-08-12 ENCOUNTER — Other Ambulatory Visit: Payer: Self-pay

## 2021-08-12 DIAGNOSIS — I4819 Other persistent atrial fibrillation: Secondary | ICD-10-CM | POA: Diagnosis present

## 2021-08-12 DIAGNOSIS — Z01818 Encounter for other preprocedural examination: Secondary | ICD-10-CM | POA: Diagnosis present

## 2021-08-12 DIAGNOSIS — I428 Other cardiomyopathies: Secondary | ICD-10-CM | POA: Insufficient documentation

## 2021-08-12 DIAGNOSIS — I10 Essential (primary) hypertension: Secondary | ICD-10-CM | POA: Diagnosis present

## 2021-08-12 LAB — ECHOCARDIOGRAM COMPLETE: S' Lateral: 3.4 cm

## 2021-08-12 NOTE — Progress Notes (Signed)
Anesthesia Chart Review ? ? Case: [id number] Date/Time: 08/15/21 1330  ? Procedure: TOTAL HIP ARTHROPLASTY ANTERIOR APPROACH (Left: Hip)  ? Anesthesia type: Choice  ? Pre-op diagnosis: left hip osteoarthritis  ? Location: [facility] ROOM 09 / [facility]  ? Surgeons: Gaynelle Arabian, MD  ? ?  ? ? ?DISCUSSION:45 y.o. never smoker with h/o cardiomyopathy, a-fib, left hip OA scheduled for above procedure 08/15/2021 with Dr. Gaynelle Arabian.  ? ?Pt seen by cardiology for preoperative evaluation 06/15/2021. Per cardiology note, "Preop clearance for total hip replacement 08/15/21. Patient with persistent Afib/flutter, NICM, normal GXT 2019 asymptomatic. Considering ablation after hip surgery. Very active METS >7, no ischemic testing needed prior to hip replacement. Starting Xarelto today for AFib-can hold 2 days prior to hip surgery. ?  ?According to the Revised Cardiac Risk Index (RCRI), his Perioperative Risk of Major Cardiac Event is (%): 0.9 ?  ?His Functional Capacity in METs is: 7.25 according to the Duke Activity Status Index (DASI)." ? ?Pt reports last dose of Xarelto 08/12/21.  ? ?Anticipate pt can proceed with planned procedure barring acute status change.   ?VS: BP (!) 124/97   Pulse 88   Temp 36.6 ?C (Oral)   Ht 5\' 11"  (1.803 m)   Wt 86.6 kg   BMI 26.64 kg/m?  ? ?PROVIDERS: ?Marrian Salvage, FNP is PCP  ? ?[facility] Cardiologist:  Candee Furbish, MD  ? ?LABS: Labs reviewed: Acceptable for surgery. ?(all labs ordered are listed, but only abnormal results are displayed) ? ?Labs Reviewed  ?CBC - Abnormal; Notable for the following components:  ?    Result Value  ? RBC 3.47 (*)   ? Hemoglobin 11.1 (*)   ? HCT 33.2 (*)   ? All other components within normal limits  ?COMPREHENSIVE METABOLIC PANEL - Abnormal; Notable for the following components:  ? Sodium 132 (*)   ? Glucose, Bld 107 (*)   ? Calcium 8.7 (*)   ? All other components within normal limits  ?SURGICAL PCR SCREEN  ?TYPE AND SCREEN   ? ? ? ?IMAGES: ? ? ?EKG: ?08/02/2021 ?Rate 89 bpm  ?Atrial fibrillation ? ?CV: ?Echo 08/12/2021 ? 1. Left ventricular ejection fraction, by estimation, is 45 to 50% but  ?may be underestimated in setting of atrial fibrillation and significant  ?septal/lateral dysynchrony from BBB. The left ventricle has mildly  ?decreased function. The left ventricle  ?demonstrates global hypokinesis. There is mild concentric left ventricular  ?hypertrophy. Left ventricular diastolic function could not be evaluated.  ? 2. Right ventricular systolic function is normal. The right ventricular  ?size is normal.  ? 3. The mitral valve is normal in structure. Mild mitral valve  ?regurgitation. No evidence of mitral stenosis.  ? 4. The aortic valve is normal in structure. Aortic valve regurgitation is  ?trivial. No aortic stenosis is present.  ? 5. Aortic dilatation noted. There is mild dilatation of the aortic root,  ?measuring 41 mm.  ? 6. The inferior vena cava is normal in size with greater than 50%  ?respiratory variability, suggesting right atrial pressure of 3 mmHg.  ?Past Medical History:  ?Diagnosis Date  ? Arthritis   ? Cardiomyopathy ([place])   ? Dysrhythmia   ? Hypothyroidism   ? Persistent atrial fibrillation ([place])   ? Typical atrial flutter ([place])   ? ? ?Past Surgical History:  ?Procedure Laterality Date  ? CARDIOVERSION N/A 10/01/2017  ? Procedure: CARDIOVERSION;  Surgeon: Lelon Perla, MD;  Location: [place];  Service: Cardiovascular;  Laterality: N/A;  ? NO PAST SURGERIES    ? ? ?MEDICATIONS: ? diltiazem (TIAZAC) 240 MG 24 hr capsule  ? levothyroxine (SYNTHROID) 100 MCG tablet  ? metoprolol succinate (TOPROL-XL) 50 MG 24 hr tablet  ? naproxen sodium (ALEVE) 220 MG tablet  ? Polyethyl Glycol-Propyl Glycol (SYSTANE OP)  ? rivaroxaban (XARELTO) 20 MG TABS tablet  ? telmisartan (MICARDIS) 20 MG tablet  ? ?No current facility-administered medications for this encounter.  ? ? ? ?Walter Felix Ward, PA-C ?[facility] Pre-Surgical  Testing ?[phone] ? ? ? ? ? ?

## 2021-08-12 NOTE — Anesthesia Preprocedure Evaluation (Addendum)
Anesthesia Evaluation  ?Patient identified by MRN, date of birth, ID band ?Patient awake ? ? ? ?Reviewed: ?Allergy & Precautions, H&P , NPO status , Patient's Chart, lab work & pertinent test results ? ?Airway ?Mallampati: II ? ?TM Distance: >3 FB ?Neck ROM: Full ? ? ? Dental ?no notable dental hx. ?(+) Teeth Intact, Dental Advisory Given ?  ?Pulmonary ?neg pulmonary ROS,  ?  ?Pulmonary exam normal ?breath sounds clear to auscultation ? ? ? ? ? ? Cardiovascular ?hypertension, + dysrhythmias Atrial Fibrillation  ?Rhythm:Regular Rate:Normal ? ? ?  ?Neuro/Psych ?negative neurological ROS ? negative psych ROS  ? GI/Hepatic ?negative GI ROS, Neg liver ROS,   ?Endo/Other  ?Hypothyroidism  ? Renal/GU ?negative Renal ROS  ?negative genitourinary ?  ?Musculoskeletal ? ?(+) Arthritis , Osteoarthritis,   ? Abdominal ?  ?Peds ? Hematology ?negative hematology ROS ?(+)   ?Anesthesia Other Findings ? ? Reproductive/Obstetrics ?negative OB ROS ? ?  ? ? ? ? ? ? ? ? ? ? ? ? ? ?  ?  ? ? ? ? ? ? ? ?Anesthesia Physical ?Anesthesia Plan ? ?ASA: 3 ? ?Anesthesia Plan: Spinal  ? ?Post-op Pain Management: Ofirmev IV (intra-op)*  ? ?Induction: Intravenous ? ?PONV Risk Score and Plan: 2 and Midazolam, Dexamethasone, Ondansetron and Propofol infusion ? ?Airway Management Planned: Natural Airway and Simple Face Mask ? ?Additional Equipment:  ? ?Intra-op Plan:  ? ?Post-operative Plan:  ? ?Informed Consent: I have reviewed the patients History and Physical, chart, labs and discussed the procedure including the risks, benefits and alternatives for the proposed anesthesia with the patient or authorized representative who has indicated his/her understanding and acceptance.  ? ? ? ?Dental advisory given ? ?Plan Discussed with: CRNA ? ?Anesthesia Plan Comments: (See PAT note 08/03/2021, Jodell Cipro Ward, PA-C)  ? ? ? ? ? ?Anesthesia Quick Evaluation ? ?

## 2021-08-15 ENCOUNTER — Ambulatory Visit (HOSPITAL_COMMUNITY): Payer: Managed Care, Other (non HMO)

## 2021-08-15 ENCOUNTER — Ambulatory Visit (HOSPITAL_BASED_OUTPATIENT_CLINIC_OR_DEPARTMENT_OTHER): Payer: Managed Care, Other (non HMO) | Admitting: Anesthesiology

## 2021-08-15 ENCOUNTER — Observation Stay (HOSPITAL_COMMUNITY)
Admission: RE | Admit: 2021-08-15 | Discharge: 2021-08-16 | Disposition: A | Discharge status: Home / Self Care | Payer: Managed Care, Other (non HMO) | Source: Ambulatory Visit | Attending: Orthopedic Surgery | Admitting: Orthopedic Surgery

## 2021-08-15 ENCOUNTER — Other Ambulatory Visit: Payer: Self-pay

## 2021-08-15 ENCOUNTER — Encounter (HOSPITAL_COMMUNITY)
Admission: RE | Disposition: A | Discharge status: Home / Self Care | Payer: Self-pay | Source: Ambulatory Visit | Attending: Orthopedic Surgery

## 2021-08-15 ENCOUNTER — Ambulatory Visit (HOSPITAL_COMMUNITY): Payer: Managed Care, Other (non HMO) | Admitting: Physician Assistant

## 2021-08-15 ENCOUNTER — Encounter (HOSPITAL_COMMUNITY): Payer: Self-pay | Admitting: Orthopedic Surgery

## 2021-08-15 ENCOUNTER — Observation Stay (HOSPITAL_COMMUNITY): Payer: Managed Care, Other (non HMO)

## 2021-08-15 DIAGNOSIS — E039 Hypothyroidism, unspecified: Secondary | ICD-10-CM | POA: Diagnosis not present

## 2021-08-15 DIAGNOSIS — I4891 Unspecified atrial fibrillation: Secondary | ICD-10-CM

## 2021-08-15 DIAGNOSIS — Z79899 Other long term (current) drug therapy: Secondary | ICD-10-CM | POA: Diagnosis not present

## 2021-08-15 DIAGNOSIS — I10 Essential (primary) hypertension: Secondary | ICD-10-CM

## 2021-08-15 DIAGNOSIS — M1612 Unilateral primary osteoarthritis, left hip: Secondary | ICD-10-CM

## 2021-08-15 HISTORY — PX: TOTAL HIP ARTHROPLASTY: SHX124

## 2021-08-15 LAB — ABO/RH: ABO/RH(D): O POS

## 2021-08-15 SURGERY — ARTHROPLASTY, HIP, TOTAL, ANTERIOR APPROACH
Anesthesia: Spinal | Site: Hip | Laterality: Left

## 2021-08-15 MED ORDER — ACETAMINOPHEN 325 MG PO TABS
325.0000 mg | ORAL_TABLET | Freq: Four times a day (QID) | ORAL | Status: DC | PRN
  Filled 2021-08-15: qty 2

## 2021-08-15 MED ORDER — METOCLOPRAMIDE HCL 5 MG PO TABS: 5.0000 mg | ORAL_TABLET | Freq: Three times a day (TID) | ORAL | Status: DC | PRN

## 2021-08-15 MED ORDER — DEXAMETHASONE SODIUM PHOSPHATE 10 MG/ML IJ SOLN
INTRAMUSCULAR | Status: AC
  Filled 2021-08-15: qty 1

## 2021-08-15 MED ORDER — ONDANSETRON HCL 4 MG/2ML IJ SOLN: 4.0000 mg | Freq: Four times a day (QID) | INTRAMUSCULAR | Status: DC | PRN

## 2021-08-15 MED ORDER — FENTANYL CITRATE (PF) 100 MCG/2ML IJ SOLN
INTRAMUSCULAR | Status: AC
  Filled 2021-08-15: qty 2

## 2021-08-15 MED ORDER — MORPHINE SULFATE (PF) 2 MG/ML IV SOLN: 0.5000 mg | INTRAVENOUS | Status: DC | PRN

## 2021-08-15 MED ORDER — ONDANSETRON HCL 4 MG/2ML IJ SOLN
INTRAMUSCULAR | Status: AC
  Filled 2021-08-15: qty 2

## 2021-08-15 MED ORDER — METHOCARBAMOL 1000 MG/10ML IJ SOLN
500.0000 mg | Freq: Four times a day (QID) | INTRAVENOUS | Status: DC | PRN
  Filled 2021-08-15: qty 5

## 2021-08-15 MED ORDER — BUPIVACAINE IN DEXTROSE 0.75-8.25 % IT SOLN
INTRATHECAL | Status: DC | PRN
  Administered 2021-08-15: 1.6 mL via INTRATHECAL

## 2021-08-15 MED ORDER — PHENYLEPHRINE HCL (PRESSORS) 10 MG/ML IV SOLN
INTRAVENOUS | Status: AC
  Filled 2021-08-15: qty 1

## 2021-08-15 MED ORDER — CHLORHEXIDINE GLUCONATE 0.12 % MT SOLN
15.0000 mL | Freq: Once | OROMUCOSAL | Status: AC
  Administered 2021-08-15: 15 mL via OROMUCOSAL

## 2021-08-15 MED ORDER — LACTATED RINGERS IV SOLN: INTRAVENOUS | Status: DC

## 2021-08-15 MED ORDER — SODIUM CHLORIDE 0.9 % IV SOLN: INTRAVENOUS | Status: DC

## 2021-08-15 MED ORDER — DILTIAZEM HCL ER BEADS 240 MG PO CP24
240.0000 mg | ORAL_CAPSULE | Freq: Every day | ORAL | Status: DC
  Filled 2021-08-15: qty 1

## 2021-08-15 MED ORDER — 0.9 % SODIUM CHLORIDE (POUR BTL) OPTIME
TOPICAL | Status: DC | PRN
  Administered 2021-08-15: 1000 mL

## 2021-08-15 MED ORDER — HYDROMORPHONE HCL 1 MG/ML IJ SOLN: 0.2500 mg | INTRAMUSCULAR | Status: DC | PRN

## 2021-08-15 MED ORDER — HYDROCODONE-ACETAMINOPHEN 7.5-325 MG PO TABS
1.0000 | ORAL_TABLET | ORAL | Status: DC | PRN
  Administered 2021-08-16: 1 via ORAL
  Filled 2021-08-15: qty 1

## 2021-08-15 MED ORDER — PROPOFOL 500 MG/50ML IV EMUL
INTRAVENOUS | Status: DC | PRN
  Administered 2021-08-15: 100 ug/kg/min via INTRAVENOUS

## 2021-08-15 MED ORDER — PROPOFOL 1000 MG/100ML IV EMUL
INTRAVENOUS | Status: AC
  Filled 2021-08-15: qty 100

## 2021-08-15 MED ORDER — ONDANSETRON HCL 4 MG PO TABS: 4.0000 mg | ORAL_TABLET | Freq: Four times a day (QID) | ORAL | Status: DC | PRN

## 2021-08-15 MED ORDER — BUPIVACAINE-EPINEPHRINE (PF) 0.25% -1:200000 IJ SOLN
INTRAMUSCULAR | Status: DC | PRN
  Administered 2021-08-15: 30 mL

## 2021-08-15 MED ORDER — LEVOTHYROXINE SODIUM 100 MCG PO TABS
100.0000 ug | ORAL_TABLET | Freq: Every day | ORAL | Status: DC
  Filled 2021-08-15: qty 1

## 2021-08-15 MED ORDER — CEFAZOLIN SODIUM-DEXTROSE 2-4 GM/100ML-% IV SOLN
2.0000 g | INTRAVENOUS | Status: AC
  Administered 2021-08-15: 2 g via INTRAVENOUS
  Filled 2021-08-15: qty 100

## 2021-08-15 MED ORDER — DOCUSATE SODIUM 100 MG PO CAPS
100.0000 mg | ORAL_CAPSULE | Freq: Two times a day (BID) | ORAL | Status: DC
  Administered 2021-08-15: 100 mg via ORAL
  Filled 2021-08-15: qty 1

## 2021-08-15 MED ORDER — TRANEXAMIC ACID-NACL 1000-0.7 MG/100ML-% IV SOLN
1000.0000 mg | INTRAVENOUS | Status: AC
  Administered 2021-08-15: 1000 mg via INTRAVENOUS
  Filled 2021-08-15: qty 100

## 2021-08-15 MED ORDER — METOCLOPRAMIDE HCL 5 MG/ML IJ SOLN: 5.0000 mg | Freq: Three times a day (TID) | INTRAMUSCULAR | Status: DC | PRN

## 2021-08-15 MED ORDER — POVIDONE-IODINE 10 % EX SWAB
2.0000 "application " | Freq: Once | CUTANEOUS | Status: AC
  Administered 2021-08-15: 2 via TOPICAL

## 2021-08-15 MED ORDER — PROPOFOL 10 MG/ML IV BOLUS
INTRAVENOUS | Status: DC | PRN
  Administered 2021-08-15: 10 mg via INTRAVENOUS
  Administered 2021-08-15: 20 mg via INTRAVENOUS

## 2021-08-15 MED ORDER — GLYCOPYRROLATE 0.2 MG/ML IJ SOLN
INTRAMUSCULAR | Status: AC
  Filled 2021-08-15: qty 1

## 2021-08-15 MED ORDER — RIVAROXABAN 10 MG PO TABS: 10.0000 mg | ORAL_TABLET | Freq: Every day | ORAL | Status: DC

## 2021-08-15 MED ORDER — HYDROCODONE-ACETAMINOPHEN 5-325 MG PO TABS: 1.0000 | ORAL_TABLET | ORAL | Status: DC | PRN

## 2021-08-15 MED ORDER — CEFAZOLIN SODIUM-DEXTROSE 2-4 GM/100ML-% IV SOLN
2.0000 g | Freq: Four times a day (QID) | INTRAVENOUS | Status: AC
  Administered 2021-08-15 – 2021-08-16 (×2): 2 g via INTRAVENOUS
  Filled 2021-08-15 (×2): qty 100

## 2021-08-15 MED ORDER — METHOCARBAMOL 500 MG PO TABS
500.0000 mg | ORAL_TABLET | Freq: Four times a day (QID) | ORAL | Status: DC | PRN
  Administered 2021-08-15 – 2021-08-16 (×2): 500 mg via ORAL
  Filled 2021-08-15 (×2): qty 1

## 2021-08-15 MED ORDER — ORAL CARE MOUTH RINSE: 15.0000 mL | Freq: Once | OROMUCOSAL | Status: AC

## 2021-08-15 MED ORDER — ONDANSETRON HCL 4 MG/2ML IJ SOLN
INTRAMUSCULAR | Status: DC | PRN
  Administered 2021-08-15: 4 mg via INTRAVENOUS

## 2021-08-15 MED ORDER — MIDAZOLAM HCL 5 MG/5ML IJ SOLN
INTRAMUSCULAR | Status: DC | PRN
Start: 2021-08-15 — End: 2021-08-15
  Administered 2021-08-15: 2 mg via INTRAVENOUS

## 2021-08-15 MED ORDER — FENTANYL CITRATE (PF) 100 MCG/2ML IJ SOLN
INTRAMUSCULAR | Status: DC | PRN
  Administered 2021-08-15: 100 ug via INTRAVENOUS

## 2021-08-15 MED ORDER — METOPROLOL SUCCINATE ER 50 MG PO TB24: 50.0000 mg | ORAL_TABLET | Freq: Every day | ORAL | Status: DC

## 2021-08-15 MED ORDER — PHENOL 1.4 % MT LIQD: 1.0000 | OROMUCOSAL | Status: DC | PRN

## 2021-08-15 MED ORDER — BISACODYL 10 MG RE SUPP: 10.0000 mg | Freq: Every day | RECTAL | Status: DC | PRN

## 2021-08-15 MED ORDER — DEXAMETHASONE SODIUM PHOSPHATE 10 MG/ML IJ SOLN
8.0000 mg | Freq: Once | INTRAMUSCULAR | Status: AC
  Administered 2021-08-15: 8 mg via INTRAVENOUS

## 2021-08-15 MED ORDER — WATER FOR IRRIGATION, STERILE IR SOLN
Status: DC | PRN
  Administered 2021-08-15: 2000 mL

## 2021-08-15 MED ORDER — ACETAMINOPHEN 10 MG/ML IV SOLN
1000.0000 mg | Freq: Four times a day (QID) | INTRAVENOUS | Status: DC
  Administered 2021-08-15: 1000 mg via INTRAVENOUS
  Filled 2021-08-15: qty 100

## 2021-08-15 MED ORDER — TRAMADOL HCL 50 MG PO TABS: 50.0000 mg | ORAL_TABLET | Freq: Four times a day (QID) | ORAL | Status: DC | PRN

## 2021-08-15 MED ORDER — PHENYLEPHRINE HCL-NACL 20-0.9 MG/250ML-% IV SOLN
INTRAVENOUS | Status: DC | PRN
  Administered 2021-08-15: 50 ug/min via INTRAVENOUS

## 2021-08-15 MED ORDER — DEXAMETHASONE SODIUM PHOSPHATE 10 MG/ML IJ SOLN
10.0000 mg | Freq: Once | INTRAMUSCULAR | Status: AC
  Administered 2021-08-16: 10 mg via INTRAVENOUS
  Filled 2021-08-15: qty 1

## 2021-08-15 MED ORDER — IRBESARTAN 75 MG PO TABS
75.0000 mg | ORAL_TABLET | Freq: Every day | ORAL | Status: DC
Start: 2021-08-16 — End: 2021-08-16

## 2021-08-15 MED ORDER — BUPIVACAINE-EPINEPHRINE (PF) 0.25% -1:200000 IJ SOLN
INTRAMUSCULAR | Status: AC
  Filled 2021-08-15: qty 30

## 2021-08-15 MED ORDER — MENTHOL 3 MG MT LOZG: 1.0000 | LOZENGE | OROMUCOSAL | Status: DC | PRN

## 2021-08-15 MED ORDER — POLYETHYLENE GLYCOL 3350 17 G PO PACK: 17.0000 g | PACK | Freq: Every day | ORAL | Status: DC | PRN

## 2021-08-15 MED ORDER — MIDAZOLAM HCL 2 MG/2ML IJ SOLN
INTRAMUSCULAR | Status: AC
  Filled 2021-08-15: qty 2

## 2021-08-15 SURGICAL SUPPLY — 41 items
BAG COUNTER SPONGE SURGICOUNT (BAG) ×1 IMPLANT
BAG DECANTER FOR FLEXI CONT (MISCELLANEOUS) IMPLANT
BAG ZIPLOCK 12X15 (MISCELLANEOUS) ×1 IMPLANT
BLADE SAG 18X100X1.27 (BLADE) ×2 IMPLANT
COVER PERINEAL POST (MISCELLANEOUS) ×2 IMPLANT
COVER SURGICAL LIGHT HANDLE (MISCELLANEOUS) ×2 IMPLANT
CUP ACETBLR 54 OD PINNACLE (Hips) ×1 IMPLANT
DRAPE FOOT SWITCH (DRAPES) ×2 IMPLANT
DRAPE STERI IOBAN 125X83 (DRAPES) ×2 IMPLANT
DRAPE U-SHAPE 47X51 STRL (DRAPES) ×4 IMPLANT
DRSG AQUACEL AG ADV 3.5X10 (GAUZE/BANDAGES/DRESSINGS) ×2 IMPLANT
DURAPREP 26ML APPLICATOR (WOUND CARE) ×2 IMPLANT
ELECT REM PT RETURN 15FT ADLT (MISCELLANEOUS) ×2 IMPLANT
GLOVE SRG 8 PF TXTR STRL LF DI (GLOVE) ×1 IMPLANT
GLOVE SURG ENC MOIS LTX SZ6.5 (GLOVE) ×2 IMPLANT
GLOVE SURG ENC MOIS LTX SZ7 (GLOVE) ×2 IMPLANT
GLOVE SURG ENC MOIS LTX SZ8 (GLOVE) ×4 IMPLANT
GLOVE SURG UNDER POLY LF SZ7 (GLOVE) ×2 IMPLANT
GLOVE SURG UNDER POLY LF SZ8 (GLOVE) ×1
GLOVE SURG UNDER POLY LF SZ8.5 (GLOVE) IMPLANT
GOWN STRL REUS W/ TWL LRG LVL3 (GOWN DISPOSABLE) ×2 IMPLANT
GOWN STRL REUS W/TWL LRG LVL3 (GOWN DISPOSABLE) ×2
HEAD CERAMIC DELTA 36 PLUS 1.5 (Hips) ×1 IMPLANT
HOLDER FOLEY CATH W/STRAP (MISCELLANEOUS) ×2 IMPLANT
KIT TURNOVER KIT A (KITS) IMPLANT
LINER MARATHON NEUT +4X54X36 (Hips) ×1 IMPLANT
MANIFOLD NEPTUNE II (INSTRUMENTS) ×2 IMPLANT
PACK ANTERIOR HIP CUSTOM (KITS) ×2 IMPLANT
PENCIL SMOKE EVACUATOR COATED (MISCELLANEOUS) ×2 IMPLANT
SPIKE FLUID TRANSFER (MISCELLANEOUS) ×1 IMPLANT
SPONGE T-LAP 18X18 ~~LOC~~+RFID (SPONGE) ×4 IMPLANT
STEM FEM ACTIS HIGH SZ7 (Stem) ×1 IMPLANT
STRIP CLOSURE SKIN 1/2X4 (GAUZE/BANDAGES/DRESSINGS) ×3 IMPLANT
SUT ETHIBOND NAB CT1 #1 30IN (SUTURE) ×2 IMPLANT
SUT MNCRL AB 4-0 PS2 18 (SUTURE) ×2 IMPLANT
SUT STRATAFIX 0 PDS 27 VIOLET (SUTURE) ×2
SUT VIC AB 2-0 CT1 27 (SUTURE) ×2
SUT VIC AB 2-0 CT1 TAPERPNT 27 (SUTURE) ×2 IMPLANT
SUTURE STRATFX 0 PDS 27 VIOLET (SUTURE) ×1 IMPLANT
TRAY FOLEY MTR SLVR 16FR STAT (SET/KITS/TRAYS/PACK) ×2 IMPLANT
TUBE SUCTION HIGH CAP CLEAR NV (SUCTIONS) ×2 IMPLANT

## 2021-08-15 NOTE — H&P (Signed)
TOTAL HIP ADMISSION H&P ? ?Patient is admitted for left total hip arthroplasty. ? ?Subjective: ? ?Chief Complaint: Left hip pain ? 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 Onset of symptoms was gradual, starting  several  years ago with gradually worsening course since that time. The patient noted no past surgery on the left hip. Patient currently rates pain in the left hip at 7 out of 10 with activity. Patient has worsening of pain with activity and weight bearing, pain that interfers with activities of daily living, and pain with passive range of motion. Patient has evidence of periarticular osteophytes and joint space narrowing by imaging studies. This condition presents safety issues increasing the risk of falls. There is no current active infection. ? ?Patient Active Problem List  ? Diagnosis Date Noted  ? Deformity of left hip joint 09/11/2019  ? Primary osteoarthritis of left hip 10/08/2018  ? Benign essential hypertension 10/08/2018  ? Atrial fibrillation ([place]) 10/08/2018  ? Hypothyroidism 10/08/2018  ? ? ?Past Medical History:  ?Diagnosis Date  ? Arthritis   ? Cardiomyopathy ([place])   ? Dysrhythmia   ? Hypothyroidism   ? Persistent atrial fibrillation ([place])   ? Typical atrial flutter ([place])   ? ? ?Past Surgical History:  ?Procedure Laterality Date  ? CARDIOVERSION N/A 10/01/2017  ? Procedure: CARDIOVERSION;  Surgeon: Lelon Perla, MD;  [facility] END[facility]COPY;  Service: Cardiovascular;  Laterality: N/A;  ? NO PAST SURGERIES    ? ? ?Prior to Admission medications   ?Medication Sig Start Date End Date Taking? Authorizing Provider  ?diltiazem (TIAZAC) 240 MG 24 hr capsule TAKE 1 CAPSULE BY MOUTH DAILY ?Patient taking differently: Take 240 mg  by mouth daily. 07/04/21  Yes Jerline Pain, MD  ?levothyroxine (SYNTHROID) 100 MCG tablet Take 100 mcg by mouth daily before breakfast. 01/14/20  Yes [provider]  ?metoprolol succinate (TOPROL-XL) 50 MG 24 hr tablet TAKE 1 TABLET(50 MG) BY MOUTH DAILY WITH OR IMMEDIATELY FOLLOWING A MEAL ?Patient taking differently: Take 50 mg by mouth daily. 06/20/21  Yes Jerline Pain, MD  ?naproxen sodium (ALEVE) 220 MG tablet Take 220 mg by mouth 2 (two) times daily as needed (pain).   Yes [provider]  ?Polyethyl Glycol-Propyl Glycol (SYSTANE OP) Place 4-5 drops into both eyes 2 (two) times daily.    Yes [provider]  ?rivaroxaban (XARELTO) 20 MG TABS tablet Take 1 tablet (20 mg total) by mouth daily with supper. ?Patient taking differently: Take 20 mg by mouth in the morning. 06/15/21  Yes Imogene Burn, PA-C  ?telmisartan (MICARDIS) 20 MG tablet TAKE 1 TABLET BY MOUTH DAILY ?Patient taking differently: Take 20 mg by mouth daily. 07/27/21  Yes Jerline Pain, MD  ? ? ?No Known Allergies ? ?Social History  ? ?Socioeconomic History  ? Marital status: Single  ?  Spouse name: Not on file  ? Number of children: Not on file  ? Years of education: Not on file  ? Highest education level: Not on file  ?Occupational History  ? Not on file  ?Tobacco Use  ? Smoking status: Never  ? Smokeless tobacco: Never  ?Vaping Use  ? Vaping Use: Never used  ?Substance and Sexual Activity  ? Alcohol use: Not Currently  ?  Comment: occ  ? Drug use: No  ? Sexual activity: Not on file  ?Other Topics Concern  ? Not on file  ?Social History Narrative  ? Lives in [place] alone.  Divorced  ? No children  ?   ? Pathologist, working at [employer]  ?   ? ?Social Determinants of Health  ? ?Financial Resource Strain: Not on file  ?Food Insecurity: Not on file  ?Transportation Needs: Not on file  ?Physical Activity: Not on file  ?Stress: Not on file  ?Social Connections: Not on file  ?Intimate Partner  Violence: Not on file  ? ? ?Tobacco Use: Low Risk   ? Smoking Tobacco Use: Never  ? Smokeless Tobacco Use: Never  ? Passive Exposure: Not on file  ? ?Social History  ? ?Substance and Sexual Activity  ?Alcohol Use Not Currently  ? Comment: occ  ? ? ?Family History  ?Problem Relation Age of Onset  ? Atrial fibrillation Maternal Grandmother   ? Heart failure Neg Hx   ? ? ?ROS: ?Constitutional: no fever, no chills, no night sweats, no significant weight loss ?Cardiovascular: no chest pain, no palpitations ?Respiratory: no cough, no shortness of breath, No COPD ?Gastrointestinal: no vomiting, no nausea ?Musculoskeletal: no swelling in Joints, Joint Pain ?Neurologic: no numbness, no tingling, no difficulty with balance ? ? ? ?Objective: ? ?Physical Exam: ?Well nourished and well developed.  ?General: Alert and oriented x3, cooperative and pleasant, no acute distress.  ?Head: normocephalic, atraumatic, neck supple.  ?Eyes: EOMI.  ?Respiratory: breath sounds clear in all fields, no wheezing, rales, or rhonchi. ?Cardiovascular: Regular rate and rhythm, no murmurs, gallops or rubs.  ?Abdomen: non-tender to palpation and soft, normoactive bowel sounds. ?Musculoskeletal: ? ? The patient has a minimally antalgic gait pattern favoring the left side without the use of assistive devices.  ?  ? Right Hip Exam:  ? The range of motion: normal without discomfort.  ?  ? Left Hip Exam:  ? The range of motion: Flexion to 100 degrees, Internal Rotation to 0 degrees, External Rotation to 5 degrees, and abduction to 15 to 20 degrees without discomfort.  ? There is no tenderness over the greater trochanteric bursa. ? ?Calves soft and nontender. Motor function intact in LE. Strength 5/5 LE bilaterally. ?Neuro: Distal pulses 2+. Sensation to light touch intact in LE. ? ? ? ?Vital signs in last 24 hours: ?  ? ?Imaging Review ? Radiographs- AP pelvis, AP and lateral of the left hip dated 04/08/2021 demonstrate bone-on-bone arthritis with  subchondral cystic formation. His right hip appears normal. ? ?Assessment/Plan: ? ?End stage arthritis, left hip ? ?The patient history, physical examination, clinical judgement of the provider and imaging studies are consistent with end stage degenerative joint disease of the left hip and total hip arthroplasty is deemed medically necessary. The treatment options including medical management, injection therapy, arthroscopy and arthroplasty were discussed at length. The risks and benefits of total hip arthroplasty were presented and reviewed. The risks due to aseptic loosening, infection, stiffness, dislocation/subluxation, thromboembolic complications and other imponderables were discussed. The patient acknowledged the explanation, agreed to proceed with the plan and consent was signed. Patient is being admitted for inpatient treatment for surgery, pain control, PT, OT, prophylactic antibiotics, VTE prophylaxis, progressive ambulation and ADLs and discharge planning.The patient is planning to be discharged  home . ? ? ?Patient's anticipated LOS is less than 2 midnights, meeting these requirements: ?- Younger than 13 ?- Lives within 1 hour of care ?-  Has a competent adult at home to recover with post-op recover ?- NO history of ? - Chronic pain requiring opiods ? - Diabetes ? - Coronary Artery Disease ? - Heart failure ? - Heart attack ? - Stroke ? - DVT/VTE ? - Cardiac arrhythmia ? - Respiratory Failure/COPD ? - Renal failure ? - Anemia ? - Advanced Liver disease ? ? ? ?Therapy Plans: HEP ?Disposition: Home with Vicente Males ?Planned DVT Prophylaxis: Xarelto 20mg  (a-fib) ?DME Needed: RW ?PCP: Jodi Mourning, PNP ?TXA: IV ?Allergies: NKDA ?Anesthesia Concerns: None ?BMI: 26.2 ?Last HgbA1c: n/a ? ?Pharmacy: [facility]  ? ?- Patient was instructed on what medications to stop prior to surgery. ?- Follow-up visit in 2 weeks with Dr. Wynelle Link ?- Begin physical therapy following surgery ?- Pre-operative lab work as  pre-surgical testing ?- Prescriptions will be provided in hospital at time of discharge ? ?Fenton Foy, MBA, PA-C ?Orthopedic Surgery ?[facility] ?  ?

## 2021-08-15 NOTE — Anesthesia Postprocedure Evaluation (Signed)
Anesthesia Post Note ? ?Patient: [name], MD ? ?Procedure(s) Performed: TOTAL HIP ARTHROPLASTY ANTERIOR APPROACH (Left: Hip) ? ?  ? ?Patient location during evaluation: PACU ?Anesthesia Type: Spinal ?Level of consciousness: oriented and awake and alert ?Pain management: pain level controlled ?Vital Signs Assessment: post-procedure vital signs reviewed and stable ?Respiratory status: spontaneous breathing and respiratory function stable ?Cardiovascular status: blood pressure returned to baseline and stable ?Postop Assessment: no headache, no backache, no apparent nausea or vomiting, spinal receding and patient able to bend at knees ?Anesthetic complications: no ? ? ?No notable events documented. ? ?Last Vitals:  ?Vitals:  ? 08/15/21 1545 08/15/21 1600  ?BP: 106/76 100/74  ?Pulse: 62 71  ?Resp: 12 15  ?Temp:    ?SpO2: 100% 100%  ?  ?Last Pain:  ?Vitals:  ? 08/15/21 1600  ?TempSrc:   ?PainSc: 0-No pain  ? ? ?LLE Motor Response: Purposeful movement (08/15/21 1600) ?LLE Sensation: Decreased (08/15/21 1600) ?RLE Motor Response: Purposeful movement (08/15/21 1600) ?RLE Sensation: Decreased (08/15/21 1600) ?L Sensory Level: S1-Sole of foot, small toes (08/15/21 1600) ?R Sensory Level: S1-Sole of foot, small toes (08/15/21 1600) ? ?Burnell Matlin,W. EDMOND ? ? ? ? ?

## 2021-08-15 NOTE — Interval H&P Note (Signed)
History and Physical Interval Note: ? ?08/15/2021 ?12:53 PM ? ?Walter Grater, MD  has presented today for surgery, with the diagnosis of left hip osteoarthritis.  The various methods of treatment have been discussed with the patient and family. After consideration of risks, benefits and other options for treatment, the patient has consented to  Procedure(s): ?TOTAL HIP ARTHROPLASTY ANTERIOR APPROACH (Left) as a surgical intervention.  The patient's history has been reviewed, patient examined, no change in status, stable for surgery.  I have reviewed the patient's chart and labs.  Questions were answered to the patient's satisfaction.   ? ? ?Walter [name] ? ? ?

## 2021-08-15 NOTE — Progress Notes (Signed)
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 ?Ortho Devices ?Ortho Device/Splint Location: Trapeze bar ?Ortho Device/Splint Interventions: Application ?  ?Post Interventions ?Patient Tolerated: Well ?Instructions Provided: Care of device, Adjustment of device ? ?Saul Fordyce ?08/15/2021, 3:57 PM ? ?

## 2021-08-15 NOTE — Discharge Instructions (Signed)
?Frank Aluisio, MD ?Total Joint Specialist ?[facility] ?[address] ?[place], [place] [address] ?[phone] ? ?ANTERIOR APPROACH TOTAL HIP REPLACEMENT POSTOPERATIVE DIRECTIONS ? ? ? ? ?Hip Rehabilitation, Guidelines Following Surgery  ?The results of a hip operation are greatly improved after range of motion and muscle strengthening exercises. Follow all safety measures which are given to protect your hip. If any of these exercises cause increased pain or swelling in your joint, decrease the amount until you are comfortable again. Then slowly increase the exercises. Call your caregiver if you have problems or questions.  ? ?HOME CARE INSTRUCTIONS  ?Remove items at home which could result in a fall. This includes throw rugs or furniture in walking pathways.  ?ICE to the affected hip as frequently as 20-30 minutes an hour and then as needed for pain and swelling. Continue to use ice on the hip for pain and swelling from surgery. You may notice swelling that will progress down to the foot and ankle. This is normal after surgery. Elevate the leg when you are not up walking on it.   ?Continue to use the breathing machine which will help keep your temperature down.  It is common for your temperature to cycle up and down following surgery, especially at night when you are not up moving around and exerting yourself.  The breathing machine keeps your lungs expanded and your temperature down. ? ?DIET ?You may resume your previous home diet once your are discharged from the hospital. ? ?DRESSING / WOUND CARE / SHOWERING ?You have an adhesive waterproof bandage over the incision. Leave this in place until your first follow-up appointment. Once you remove this you will not need to place another bandage.  ?You may begin showering 3 days following surgery, but do not submerge the incision under water. ? ?ACTIVITY ?For the first 3-5 days, it is important to rest and keep the operative leg elevated.  You should, as a general rule, rest for 50 minutes and walk/stretch for 10 minutes per hour. After 5 days, you may slowly increase activity as tolerated.  ?Perform the exercises you were provided twice a day for about 15-20 minutes each session. Begin these 2 days following surgery. ?Walk with your walker as instructed. Use the walker until you are comfortable transitioning to a cane. Walk with the cane in the opposite hand of the operative leg. You may discontinue the cane once you are comfortable and walking steadily. ?Avoid periods of inactivity such as sitting longer than an hour when not asleep. This helps prevent blood clots.  ?Do not drive a car for 6 weeks or until released by your surgeon.  ?Do not drive while taking narcotics. ? ?TED HOSE STOCKINGS ?Wear the elastic stockings on both legs for three weeks following surgery during the day. You may remove them at night while sleeping. ? ?WEIGHT BEARING ?Weight bearing as tolerated with assist device (walker, cane, etc) as directed, use it as long as suggested by your surgeon or therapist, typically at least 4-6 weeks. ? ?POSTOPERATIVE CONSTIPATION PROTOCOL ?Constipation - defined medically as fewer than three stools per week and severe constipation as less than one stool per week. ? ?One of the most common issues patients have following surgery is constipation.  Even if you have a regular bowel pattern at home, your normal regimen is likely to be disrupted due to multiple reasons following surgery.  Combination of anesthesia, postoperative narcotics, change in appetite and fluid intake all can affect your bowels.    In order to avoid complications following surgery, here are some recommendations in order to help you during your recovery period. ? ?Colace (docusate) - Pick up an over-the-counter form of Colace or another stool softener and take twice a day as long as you are requiring postoperative pain medications.  Take with a full glass of water daily.  If  you experience loose stools or diarrhea, hold the colace until you stool forms back up.  If your symptoms do not get better within 1 week or if they get worse, check with your doctor. ?Dulcolax (bisacodyl) - Pick up over-the-counter and take as directed by the product packaging as needed to assist with the movement of your bowels.  Take with a full glass of water.  Use this product as needed if not relieved by Colace only.  ?MiraLax (polyethylene glycol) - Pick up over-the-counter to have on hand.  MiraLax is a solution that will increase the amount of water in your bowels to assist with bowel movements.  Take as directed and can mix with a glass of water, juice, soda, coffee, or tea.  Take if you go more than two days without a movement.Do not use MiraLax more than once per day. Call your doctor if you are still constipated or irregular after using this medication for 7 days in a row. ? ?If you continue to have problems with postoperative constipation, please contact the office for further assistance and recommendations.  If you experience "the worst abdominal pain ever" or develop nausea or vomiting, please contact the office immediatly for further recommendations for treatment. ? ?ITCHING ? If you experience itching with your medications, try taking only a single pain pill, or even half a pain pill at a time.  You can also use Benadryl over the counter for itching or also to help with sleep.  ? ?MEDICATIONS ?See your medication summary on the ?After Visit Summary? that the nursing staff will review with you prior to discharge.  You may have some home medications which will be placed on hold until you complete the course of blood thinner medication.  It is important for you to complete the blood thinner medication as prescribed by your surgeon.  Continue your approved medications as instructed at time of discharge. ? ?PRECAUTIONS ?If you experience chest pain or shortness of breath - call 911 immediately for  transfer to the hospital emergency department.  ?If you develop a fever greater that 101 F, purulent drainage from wound, increased redness or drainage from wound, foul odor from the wound/dressing, or calf pain - CONTACT YOUR SURGEON.   ?                                                ?FOLLOW-UP APPOINTMENTS ?Make sure you keep all of your appointments after your operation with your surgeon and caregivers. You should call the office at the above phone number and make an appointment for approximately two weeks after the date of your surgery or on the date instructed by your surgeon outlined in the "After Visit Summary". ? ?RANGE OF MOTION AND STRENGTHENING EXERCISES  ?These exercises are designed to help you keep full movement of your hip joint. Follow your caregiver's or physical therapist's instructions. Perform all exercises about fifteen times, three times per day or as directed. Exercise both hips, even if you have had only   one joint replacement. These exercises can be done on a training (exercise) mat, on the floor, on a table or on a bed. Use whatever works the best and is most comfortable for you. Use music or television while you are exercising so that the exercises are a pleasant break in your day. This will make your life better with the exercises acting as a break in routine you can look forward to.  ?Lying on your back, slowly slide your foot toward your buttocks, raising your knee up off the floor. Then slowly slide your foot back down until your leg is straight again.  ?Lying on your back spread your legs as far apart as you can without causing discomfort.  ?Lying on your side, raise your upper leg and foot straight up from the floor as far as is comfortable. Slowly lower the leg and repeat.  ?Lying on your back, tighten up the muscle in the front of your thigh (quadriceps muscles). You can do this by keeping your leg straight and trying to raise your heel off the floor. This helps strengthen the  largest muscle supporting your knee.  ?Lying on your back, tighten up the muscles of your buttocks both with the legs straight and with the knee bent at a comfortable angle while keeping your heel on the floor.

## 2021-08-15 NOTE — Op Note (Signed)
?    OPERATIVE REPORT- TOTAL HIP ARTHROPLASTY ? ? ?PREOPERATIVE DIAGNOSIS: Osteoarthritis of the Left hip.  ? ?POSTOPERATIVE DIAGNOSIS: Osteoarthritis of the Left  hip.  ? ?PROCEDURE: Left total hip arthroplasty, anterior approach.  ? ?SURGEON: Gaynelle Arabian, MD  ? ?ASSISTANT: Theresa Duty, PA-C ? ?ANESTHESIA:  Spinal ? ?ESTIMATED BLOOD LOSS:-400 mL   ? ?DRAINS: Hemovac x1.  ? ?COMPLICATIONS: None ?  ?CONDITION: PACU - hemodynamically stable.  ? 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  ? ?PROCEDURE IN DETAIL: After successful administration of spinal  ?anesthetic, the traction boots for the [facility] bed were placed on both  ?feet and the patient was placed onto the [facility] bed, boots placed into the leg  ?holders. The Left hip was then isolated from the perineum with plastic  ?drapes and prepped and draped in the usual sterile fashion. ASIS and  ?greater trochanter were marked and a oblique incision was made, starting  ?at about 1 cm lateral and 2 cm distal to the ASIS and coursing towards  ?the anterior cortex of the femur. The skin was cut with a 10 blade  ?through subcutaneous tissue to the level of the fascia overlying the  ?tensor fascia lata muscle. The fascia was then incised in line with the  ?incision at the junction of the anterior third and posterior 2/3rd. The  ?muscle was teased off the fascia and then the interval between the TFL  ?and the rectus was developed. The Hohmann retractor was then placed at  ?the top of the femoral neck over the capsule. The vessels overlying the  ?capsule were cauterized and the fat on top of the capsule was removed.  ?A Hohmann retractor was then placed anterior underneath the rectus  ?femoris to give exposure to the entire anterior capsule. A T-shaped   ?capsulotomy was performed. The edges were tagged and the femoral head  ?was identified. ?      Osteophytes are removed off the superior acetabulum.  ?The femoral neck was then cut in situ with an oscillating saw. Traction  ?was then applied to the left lower extremity utilizing the [facility]  ?traction. The femoral head was then removed. Retractors were placed  ?around the acetabulum and then circumferential removal of the labrum was  ?performed. Osteophytes were also removed. Reaming starts at 49 mm to  ?medialize and  Increased in 2 mm increments to 53 mm. We reamed in  ?approximately 40 degrees of abduction, 20 degrees anteversion. A 54 mm  ?pinnacle acetabular shell was then impacted in anatomic position under  ?fluoroscopic guidance with excellent purchase. We did not need to place  ?any additional dome screws. A 36 mm neutral + 4 marathon liner was then  ?placed into the acetabular shell.  ?     The femoral lift was then placed along the lateral aspect of the femur  ?just distal to the vastus ridge. The leg was  externally rotated and capsule  ?was stripped off the inferior aspect of the femoral neck down to the  ?level of the lesser trochanter, this was done with electrocautery. The femur was lifted after this was performed. The  leg was then placed in an extended and adducted position essentially delivering the femur. We also removed the capsule superiorly and the piriformis from the  piriformis fossa to gain excellent exposure of the  ?proximal femur. Rongeur was used to remove some cancellous bone to get  ?into the lateral portion of the proximal femur for placement of the  ?initial starter reamer. The starter broaches was placed  the starter broach  and was shown to go down the center of the canal. Broaching  ?with the Actis system was then performed starting at size 0  coursing  ?Up to size 7. A size 7 had excellent torsional and rotational  ?and axial stability. The trial high offset neck was then placed   ?with a 36 + 1.5 trial head. The hip was then reduced. We confirmed that  ?the stem was in the canal both on AP and lateral x-rays. It also has excellent sizing. The hip was reduced with outstanding stability through full extension and full external rotation.. AP pelvis was taken and the leg lengths were measured and found to be equal. Hip was then dislocated again and the femoral head and neck removed. The  ?femoral broach was removed. Size 7 Actis stem with a high offset  ?neck was then impacted into the femur following native anteversion. Has  ?excellent purchase in the canal. Excellent torsional and rotational and  ?axial stability. It is confirmed to be in the canal on AP and lateral  ?fluoroscopic views. The 36 + 1.5 ceramic head was placed and the hip  ?reduced with outstanding stability. Again AP pelvis was taken and it  ?confirmed that the leg lengths were equal. The wound was then copiously  ?irrigated with saline solution and the capsule reattached and repaired  ?with Ethibond suture. 30 ml of .25% Bupivicaine was  injected into the capsule and into the edge of the tensor fascia lata as well as subcutaneous tissue. The fascia overlying the tensor fascia lata was then closed with a running #1 V-Loc. Subcu was closed with interrupted 2-0 Vicryl and subcuticular running 4-0 Monocryl. Incision was cleaned  ?and dried. Steri-Strips and a bulky sterile dressing applied. The patient was awakened and transported to  ?recovery in stable condition.  ?      Please note that a surgical assistant was a medical necessity for this procedure to perform it in a safe and expeditious manner. Assistant was necessary to provide appropriate retraction of vital neurovascular structures and to prevent femoral fracture and allow for anatomic placement of the prosthesis. ? ?Gaynelle Arabian, M.D.  ? ? ?

## 2021-08-15 NOTE — Transfer of Care (Signed)
Immediate Anesthesia Transfer of Care Note ? ?Patient: Seward Grater, MD ? ?Procedure(s) Performed: TOTAL HIP ARTHROPLASTY ANTERIOR APPROACH (Left: Hip) ? ?Patient Location: PACU ? ?Anesthesia Type:Spinal ? ?Level of Consciousness: sedated ? ?Airway & Oxygen Therapy: Patient Spontanous Breathing and Patient connected to face mask oxygen ? ?Post-op Assessment: Report given to RN and Post -op Vital signs reviewed and stable ? ?Post vital signs: Reviewed and stable ? ?Last Vitals:  ?Vitals Value Taken Time  ?BP 122/101 08/15/21 1534  ?Temp    ?Pulse    ?Resp 15 08/15/21 1536  ?SpO2    ?Vitals shown include unvalidated device data. ? ?Last Pain:  ?Vitals:  ? 08/15/21 1122  ?TempSrc:   ?PainSc: 0-No pain  ?   ? ?Patients Stated Pain Goal: 3 (08/15/21 1122) ? ?Complications: No notable events documented. ?

## 2021-08-15 NOTE — Evaluation (Signed)
Physical Therapy Evaluation ?Patient Details ?Name: Walter WILK, Walter [name] ?MRN: [id number] ?DOB: 1975-10-10 ?Today's Date: 08/15/2021 ? ?History of Present Illness ? Patient is 46 y.o. male s/p Lt THA anteiror approach on 08/15/21 with PMH significant for OA, hypothyroidism, A-flutter, A-fib, cardiomyopathy, s/p cardioversion in 2019. ? ?  ?Clinical Impression ? Walter Grater, Walter [name] is a 46 y.o. male POD 0 s/p Lt THA. Patient reports independence with mobility at baseline. Patient is now limited by functional impairments (see PT problem list below) and requires supervision for bed mobility and min assist for transfers with RW. Patient was limited by hip weakness and paresthesia 2/2 spinal block and unable to progress to gait today. Returned pt to bed and addressed questions about recovery expectations with THA. Patient will benefit from continued skilled PT interventions to address impairments and progress towards PLOF. Acute PT will follow to progress mobility and stair training in preparation for safe discharge home.    ?   ? ?Recommendations for follow up therapy are one component of a multi-disciplinary discharge planning process, led by the attending physician.  Recommendations may be updated based on patient status, additional functional criteria and insurance authorization. ? ?Follow Up Recommendations Follow physician's recommendations for discharge plan and follow up therapies ? ?  ?Assistance Recommended at Discharge Intermittent Supervision/Assistance  ?Patient can return home with the following ? A little help with walking and/or transfers;A little help with bathing/dressing/bathroom;Assistance with cooking/housework;Help with stairs or ramp for entrance ? ?  ?Equipment Recommendations Rolling walker (2 wheels)  ?Recommendations for Other Services ?    ?  ?Functional Status Assessment Patient has had a recent decline in their functional status and demonstrates the ability to make significant improvements in function  in a reasonable and predictable amount of time.  ? ?  ?Precautions / Restrictions Precautions ?Precautions: Fall ?Restrictions ?Weight Bearing Restrictions: No ?LLE Weight Bearing: Weight bearing as tolerated  ? ?  ? ?Mobility ? Bed Mobility ?Overal bed mobility: Needs Assistance ?Bed Mobility: Supine to Sit, Sit to Supine ?  ?  ?Supine to sit: Supervision, HOB elevated ?Sit to supine: Supervision, HOB elevated ?  ?General bed mobility comments: supervision for safety, assist for lines. pt using bed rail and light assist bringing Lt LE back onto bed. ?  ? ?Transfers ?Overall transfer level: Needs assistance ?Equipment used: Rolling walker (2 wheels) ?Transfers: Sit to/from Stand ?Sit to Stand: Min assist, From elevated surface ?  ?  ?  ?  ?  ?General transfer comment: cues for hand placement/technique with RW, assist to fully rise and steady in standing due to hip weakness. ?  ? ?Ambulation/Gait ?  ?  ?  ?  ?  ?  ?Pre-gait activities: pt took small forward/side steps at EOB, pt with lateral sway due to hip weakness 2/2 spinal block. assist to stabilize balance. ?  ? ?Stairs ?  ?  ?  ?  ?  ? ?Wheelchair Mobility ?  ? ?Modified Rankin (Stroke Patients Only) ?  ? ?  ? ?Balance Overall balance assessment: Needs assistance ?Sitting-balance support: Feet supported ?Sitting balance-Leahy Scale: Good ?  ?  ?Standing balance support: Bilateral upper extremity supported, During functional activity, Reliant on assistive device for balance ?Standing balance-Leahy Scale: Poor ?  ?  ?  ?  ?  ?  ?  ?  ?  ?  ?  ?  ?   ? ? ? ?Pertinent Vitals/Pain Pain Assessment ?Pain Assessment: No/denies pain  ? ? ?Home Living  Family/patient expects to be discharged to:: Private residence ?Living Arrangements: Non-relatives/Friends ?Available Help at Discharge: Family ?Type of Home: House ?Home Access: Stairs to enter ?Entrance Stairs-Rails: None ?Entrance Stairs-Number of Steps: 2 ?Alternate Level Stairs-Number of Steps: 15 ?Home Layout: Two  level;Able to live on main level with bedroom/bathroom;Bed/bath upstairs (elevator if needed (no idea if it works)) ?Home Equipment: Shower seat - built in ?Additional Comments: pt will stay with his girlfriend for the first week.  ?  ?Prior Function Prior Level of Function : Independent/Modified Independent ?  ?  ?  ?  ?  ?  ?  ?  ?  ? ? ?Hand Dominance  ? Dominant Hand: Left ? ?  ?Extremity/Trunk Assessment  ? Upper Extremity Assessment ?Upper Extremity Assessment: Overall WFL for tasks assessed ?  ? ?Lower Extremity Assessment ?Lower Extremity Assessment: LLE deficits/detail ?LLE Deficits / Details: good quad set ?LLE Sensation:  (some parasthesia in buttock and feet bil) ?  ? ?Cervical / Trunk Assessment ?Cervical / Trunk Assessment: Normal  ?Communication  ? Communication: No difficulties  ?Cognition Arousal/Alertness: Awake/alert ?Behavior During Therapy: [facility] for tasks assessed/performed ?Overall Cognitive Status: Within Functional Limits for tasks assessed ?  ?  ?  ?  ?  ?  ?  ?  ?  ?  ?  ?  ?  ?  ?  ?  ?  ?  ?  ? ?  ?General Comments   ? ?  ?Exercises    ? ?Assessment/Plan  ?  ?PT Assessment Patient needs continued PT services  ?PT Problem List Decreased strength;Decreased range of motion;Decreased balance;Decreased mobility;Decreased activity tolerance;Decreased knowledge of use of DME;Decreased knowledge of precautions;Pain ? ?   ?  ?PT Treatment Interventions DME instruction;Gait training;Stair training;Functional mobility training;Therapeutic activities;Therapeutic exercise;Balance training;Patient/family education   ? ?PT Goals (Current goals can be found in the Care Plan section)  ?Acute Rehab PT Goals ?Patient Stated Goal: get back to biking, running, tennis ?PT Goal Formulation: With patient ?Time For Goal Achievement: 08/22/21 ?Potential to Achieve Goals: Good ? ?  ?Frequency 7X/week ?  ? ? ?Co-evaluation   ?  ?  ?  ?  ? ? ?  ?AM-PAC PT "6 Clicks" Mobility  ?Outcome Measure Help needed turning from  your back to your side while in a flat bed without using bedrails?: A Little ?Help needed moving from lying on your back to sitting on the side of a flat bed without using bedrails?: A Little ?Help needed moving to and from a bed to a chair (including a wheelchair)?: A Little ?Help needed standing up from a chair using your arms (e.g., wheelchair or bedside chair)?: A Little ?Help needed to walk in hospital room?: A Lot ?Help needed climbing 3-5 steps with a railing? : Total ?6 Click Score: 15 ? ?  ?End of Session Equipment Utilized During Treatment: Gait belt ?Activity Tolerance: Patient tolerated treatment well ?Patient left: in bed;with call bell/phone within reach ?Nurse Communication: Mobility status ?PT Visit Diagnosis: Muscle weakness (generalized) (M62.81);Difficulty in walking, not elsewhere classified (R26.2) ?  ? ?Time: [id number] ?PT Time Calculation (min) (ACUTE ONLY): 19 min ? ? ?Charges:   PT Evaluation ?$PT Eval Low Complexity: 1 Low ?  ?  ?   ? ? ?Gwynneth Albright PT, DPT ?Acute Rehabilitation Services ?Office [phone] ?Pager [phone]  ? ?Walter [name] ?08/15/2021, 7:16 PM ? ?

## 2021-08-15 NOTE — Anesthesia Procedure Notes (Signed)
Spinal ? ?Patient location during procedure: OR ?Start time: 08/15/2021 1:57 PM ?End time: 08/15/2021 2:02 PM ?Reason for block: surgical anesthesia ?Staffing ?Performed: anesthesiologist  ?Anesthesiologist: Gaynelle Adu, MD ?Preanesthetic Checklist ?Completed: patient identified, IV checked, risks and benefits discussed, surgical consent, monitors and equipment checked, pre-op evaluation and timeout performed ?Spinal Block ?Patient position: sitting ?Prep: DuraPrep ?Patient monitoring: cardiac monitor, continuous pulse ox and blood pressure ?Approach: midline ?Location: L3-4 ?Injection technique: single-shot ?Needle ?Needle type: Pencan  ?Needle gauge: 24 G ?Needle length: 9 cm ?Assessment ?Sensory level: T8 ?Events: CSF return ?Additional Notes ?Functioning IV was confirmed and monitors were applied. Sterile prep and drape, including hand hygiene and sterile gloves were used. The patient was positioned and the spine was prepped. The skin was anesthetized with lidocaine.  Free flow of clear CSF was obtained prior to injecting local anesthetic into the CSF.  The spinal needle aspirated freely following injection.  The needle was carefully withdrawn.  The patient tolerated the procedure well.  ? ? ? ?

## 2021-08-16 ENCOUNTER — Encounter (HOSPITAL_COMMUNITY): Payer: Self-pay | Admitting: Orthopedic Surgery

## 2021-08-16 DIAGNOSIS — M1612 Unilateral primary osteoarthritis, left hip: Secondary | ICD-10-CM | POA: Diagnosis not present

## 2021-08-16 LAB — BASIC METABOLIC PANEL
Anion gap: 5 (ref 5–15)
BUN: 15 mg/dL (ref 6–20)
CO2: 23 mmol/L (ref 22–32)
Calcium: 8.5 mg/dL — ABNORMAL LOW (ref 8.9–10.3)
Chloride: 103 mmol/L (ref 98–111)
Creatinine, Ser: 0.85 mg/dL (ref 0.61–1.24)
GFR, Estimated: >60 mL/min (ref >60)
Glucose, Bld: 147 mg/dL — ABNORMAL HIGH (ref 70–99)
Potassium: 4.2 mmol/L (ref 3.5–5.1)
Sodium: 131 mmol/L — ABNORMAL LOW (ref 135–145)

## 2021-08-16 LAB — CBC
HCT: 30.9 % — ABNORMAL LOW (ref 39.0–52.0)
Hemoglobin: 10.4 g/dL — ABNORMAL LOW (ref 13.0–17.0)
MCH: 32 pg (ref 26.0–34.0)
MCHC: 33.7 g/dL (ref 30.0–36.0)
MCV: 95.1 fL (ref 80.0–100.0)
Platelets: 268 10*3/uL (ref 150–400)
RBC: 3.25 MIL/uL — ABNORMAL LOW (ref 4.22–5.81)
RDW: 13 % (ref 11.5–15.5)
WBC: 12.5 10*3/uL — ABNORMAL HIGH (ref 4.0–10.5)
nRBC: 0 % (ref 0.0–0.2)

## 2021-08-16 MED ORDER — HYDROCODONE-ACETAMINOPHEN 5-325 MG PO TABS
1.0000 | ORAL_TABLET | Freq: Four times a day (QID) | ORAL | 0 refills | Status: DC | PRN
Start: 2021-08-16 — End: 2021-12-01

## 2021-08-16 MED ORDER — TRAMADOL HCL 50 MG PO TABS: 50.0000 mg | ORAL_TABLET | Freq: Four times a day (QID) | ORAL | 0 refills | Status: DC | PRN

## 2021-08-16 MED ORDER — METHOCARBAMOL 500 MG PO TABS: 500.0000 mg | ORAL_TABLET | Freq: Four times a day (QID) | ORAL | 0 refills | Status: DC | PRN

## 2021-08-16 MED ORDER — DILTIAZEM HCL ER COATED BEADS 240 MG PO CP24: 240.0000 mg | ORAL_CAPSULE | Freq: Every day | ORAL | Status: DC

## 2021-08-16 NOTE — Plan of Care (Cosign Needed)
Completed during shift assessment ?

## 2021-08-16 NOTE — TOC Transition Note (Signed)
Transition of Care (TOC) - CM/SW Discharge Note ? ? ?Patient Details  ?Name: Walter ROOSEVELT, Walter [name] ?MRN: [id number] ?Date of Birth: 1975/12/03 ? ?Transition of Care (TOC) CM/SW Contact:  ?Porshea Janowski, LCSW ?Phone Number: ?08/16/2021, 10:31 AM ? ? ?Clinical Narrative:    ?Met with pt this morning who confirms need for rolling walker - no agency pref - order placed with [employer] for delivery to pt's room.  Plan for HEP.  No further TOC needs. ? ? ?Final next level of care: Home/Self Care ?Barriers to Discharge: No Barriers Identified ? ? ?Patient Goals and CMS Choice ?Patient states their goals for this hospitalization and ongoing recovery are:: return home ?  ?  ? ?Discharge Placement ?  ?           ?  ?  ?  ?  ? ?Discharge Plan and Services ?  ?  ?           ?DME Arranged: Walker rolling ?DME Agency: [employer] ?Date DME Agency Contacted: 08/16/21 ?Time DME Agency Contacted: [phone] ?Representative spoke with at DME Agency: Wells Guiles ?  ?[place] Agency: [place] ?  ?  ?  ? ?Social Determinants of Health (SDOH) Interventions ?  ? ? ?Readmission Risk Interventions ?No flowsheet data found. ? ? ? ? ?

## 2021-08-16 NOTE — Progress Notes (Signed)
? ?  Subjective: ?1 Day Post-Op Procedure(s) (LRB): ?TOTAL HIP ARTHROPLASTY ANTERIOR APPROACH (Left) ?Patient reports pain as 5 on 0-10 scale.   ?Patient seen in rounds by Dr. Wynelle Link. ?Patient is well, and has had no acute complaints or problems. Denies SOB, chest pain, or calf pain. No acute overnight events. Unable to ambulate with PT yesterday. Will continue therapy today.   ? ? ?Objective: ?Vital signs in last 24 hours: ?Temp:  [97.7 ?F (36.5 ?C)-98.9 ?F (37.2 ?C)] 98.1 ?F (36.7 ?C) (03/21 [id number]) ?Pulse Rate:  [56-95] 95 (03/21 0552) ?Resp:  [12-20] 16 (03/21 0552) ?BP: (98-133)/(64-92) 133/89 (03/21 [id number]) ?SpO2:  [98 %-100 %] 100 % (03/21 0552) ?Weight:  [86.6 kg] 86.6 kg (03/20 1122) ? ?Intake/Output from previous day: ? ?Intake/Output Summary (Last 24 hours) at 08/16/2021 0752 ?Last data filed at 08/16/2021 0630 ?Gross per 24 hour  ?Intake 2296.24 ml  ?Output 3200 ml  ?Net -903.76 ml  ?  ? ?Intake/Output this shift: ?No intake/output data recorded. ? ?Labs: ?Recent Labs  ?  08/16/21 ?0306  ?HGB 10.4*  ? ?Recent Labs  ?  08/16/21 ?0306  ?WBC 12.5*  ?RBC 3.25*  ?HCT 30.9*  ?PLT 268  ? ?Recent Labs  ?  08/16/21 ?0306  ?NA 131*  ?K 4.2  ?CL 103  ?CO2 23  ?BUN 15  ?CREATININE 0.85  ?GLUCOSE 147*  ?CALCIUM 8.5*  ? ?No results for input(s): LABPT, INR in the last 72 hours. ? ?Exam: ?General - Patient is Alert and Oriented ?Extremity - Neurologically intact ?Neurovascular intact ?Intact pulses distally ?Dorsiflexion/Plantar flexion intact ?Dressing - dressing C/D/I ?Motor Function - intact, moving foot and toes well on exam.  ? ?Past Medical History:  ?Diagnosis Date  ? Arthritis   ? Cardiomyopathy ([place])   ? Dysrhythmia   ? Hypothyroidism   ? Persistent atrial fibrillation ([place])   ? Typical atrial flutter ([place])   ? ? ?Assessment/Plan: ?1 Day Post-Op Procedure(s) (LRB): ?TOTAL HIP ARTHROPLASTY ANTERIOR APPROACH (Left) ?Principal Problem: ?  Primary osteoarthritis of left hip ? ?Estimated body mass index is 26.64 kg/m? as  calculated from the following: ?  Height as of this encounter: 5\' 11"  (1.803 m). ?  Weight as of this encounter: 86.6 kg. ?Up with therapy ? ?DVT Prophylaxis - Aspirin and TED hose ?Weight bearing as tolerated. ?Continue therapy. ? ?Plan is to go Home after hospital stay.  ? ?Plan for two sessions with PT this morning, and if meeting goals, will plan for discharge this afternoon.  ? ?Patient to follow up in two weeks with Dr. Wynelle Link in clinic.  ? ?The PDMP database was reviewed today prior to any opioid medications being prescribed to this patient.. ? ? ?Fenton Foy, MBA, PA-C ?Orthopedic Surgery ?[phone] [id number] ?08/16/2021, 7:52 AM  ?

## 2021-08-16 NOTE — Progress Notes (Signed)
Physical Therapy Treatment ?Patient Details ?Name: Walter CHAVARRIA, MD ?MRN: [id number] ?DOB: [birth year] ?Today's Date: 08/16/2021 ? ? ?History of Present Illness Patient is 46 y.o. male s/p Lt THA anteiror approach on 08/15/21 with PMH significant for OA, hypothyroidism, A-flutter, A-fib, cardiomyopathy, s/p cardioversion in 2019. ? ?  ?PT Comments  ? ? Pt is s/p L THA resulting in the deficits listed below (see PT Problem List). Pt supervision for all mobility tasks today for safety and cuing only, no physical assist required. Pt completed ambulation training with RW and SPC as well as stair straining with safe technique. Provided HEP and educated pt on importance to complete daily upon discharge; included exercise progression as well. Pt is progressing well and is safe to mobilize with caregiver assistance; we will continue to follow the pt acutely to promote independence with functional mobility. ?  ?Recommendations for follow up therapy are one component of a multi-disciplinary discharge planning process, led by the attending physician.  Recommendations may be updated based on patient status, additional functional criteria and insurance authorization. ? ?Follow Up Recommendations ? Follow physician's recommendations for discharge plan and follow up therapies ?  ?  ?Assistance Recommended at Discharge Intermittent Supervision/Assistance  ?Patient can return home with the following A little help with walking and/or transfers;A little help with bathing/dressing/bathroom;Assistance with cooking/housework;Help with stairs or ramp for entrance ?  ?Equipment Recommendations ? Rolling walker (2 wheels)  ?  ?Recommendations for Other Services   ? ? ?  ?Precautions / Restrictions Precautions ?Precautions: Fall ?Restrictions ?Weight Bearing Restrictions: No ?LLE Weight Bearing: Weight bearing as tolerated  ?  ? ?Mobility ? Bed Mobility ?Overal bed mobility: Needs Assistance ?Bed Mobility: Supine to Sit ?  ?  ?Supine to sit:  Supervision ?  ?  ?General bed mobility comments: Supervision for safety, VC for hand placement and use of bed rail ?  ? ?Transfers ?Overall transfer level: Needs assistance ?Equipment used: Rolling walker (2 wheels) ?Transfers: Sit to/from Stand ?Sit to Stand: Supervision ?  ?  ?  ?  ?  ?General transfer comment: Pt supervision for safety with VC for sequencing and hand placement for power up to standing. Educated pt on appropriate technique to use for sit to stand to/from toilet; pt demonstrated safe form. ?  ? ?Ambulation/Gait ?Ambulation/Gait assistance: Supervision ?Gait Distance (Feet): 60 Feet ?Assistive device: Rolling walker (2 wheels), Straight cane ?Gait Pattern/deviations: Step-to pattern, Decreased step length - left, Decreased stance time - left, Decreased weight shift to left ?Gait velocity: decreased ?  ?  ?General Gait Details: Pt supervision for safety with moderate VC for proximity to device and taking shorter steps, especially during turning (and to turn to R side). Pt ambulated well with RW and educated on importance of using during immediate rehabilitation period. Pt has SPC at home and would like to use it after done with RW so educated pt on two point gait and holding in opposite hand. Pt demonstrated safe form and technique. no overt LOB ? ? ?Stairs ?Stairs: Yes ?Stairs assistance: Supervision ?Stair Management: One rail Left, Forwards ?Number of Stairs: 5 ?General stair comments: Pt supervision for safety, educated wife on proper guarding technique, and stair ascent/descent pattern. Recommended using walked during ingress/egress to/from house. Pt completed training with safe technique, no overt LOB. ? ? ?Wheelchair Mobility ?  ? ?Modified Rankin (Stroke Patients Only) ?  ? ? ?  ?Balance Overall balance assessment: Needs assistance ?Sitting-balance support: Feet supported, No upper extremity supported ?Sitting balance-Leahy Scale:  Good ?  ?  ?Standing balance support: Single extremity  supported, Bilateral upper extremity supported, During functional activity ?Standing balance-Leahy Scale: Fair ?Standing balance comment: Pt utilized BUE on RW and single UE support on railing/SPC during functional mobility tasks today. ?  ?  ?  ?  ?  ?  ?  ?  ?  ?  ?  ?  ? ?  ?Cognition Arousal/Alertness: Awake/alert ?Behavior During Therapy: [facility] for tasks assessed/performed ?Overall Cognitive Status: Within Functional Limits for tasks assessed ?  ?  ?  ?  ?  ?  ?  ?  ?  ?  ?  ?  ?  ?  ?  ?  ?  ?  ?  ? ?  ?Exercises Total Joint Exercises ?Ankle Circles/Pumps: AROM, Both, 10 reps ?Quad Sets: AROM, Left, 5 reps ? ?  ?General Comments General comments (skin integrity, edema, etc.): girlfriend Amil Amen present during session and will be with pt upon discharge ?  ?  ? ?Pertinent Vitals/Pain Pain Assessment ?Pain Assessment: 0-10 ?Pain Score: 5  ?Pain Location: L hip ?Pain Descriptors / Indicators: Operative site guarding, Discomfort, Cramping ?Pain Intervention(s): Monitored during session, Repositioned  ? ? ?Home Living   ?  ?  ?  ?  ?  ?  ?  ?  ?  ?   ?  ?Prior Function    ?  ?  ?   ? ?PT Goals (current goals can now be found in the care plan section) Acute Rehab PT Goals ?Patient Stated Goal: get back to biking, running, tennis ?PT Goal Formulation: With patient ?Time For Goal Achievement: 08/22/21 ?Potential to Achieve Goals: Good ?Progress towards PT goals: Progressing toward goals ? ?  ?Frequency ? ? ? 7X/week ? ? ? ?  ?PT Plan Current plan remains appropriate  ? ? ?Co-evaluation   ?  ?  ?  ?  ? ?  ?AM-PAC PT "6 Clicks" Mobility   ?Outcome Measure ? Help needed turning from your back to your side while in a flat bed without using bedrails?: A Little ?Help needed moving from lying on your back to sitting on the side of a flat bed without using bedrails?: A Little ?Help needed moving to and from a bed to a chair (including a wheelchair)?: A Little ?Help needed standing up from a chair using your arms (e.g., wheelchair  or bedside chair)?: A Little ?Help needed to walk in hospital room?: A Little ?Help needed climbing 3-5 steps with a railing? : A Little ?6 Click Score: 18 ? ?  ?End of Session Equipment Utilized During Treatment: Gait belt ?Activity Tolerance: Patient tolerated treatment well ?Patient left: in chair;with call bell/phone within reach;with family/visitor present ?Nurse Communication: Mobility status ?PT Visit Diagnosis: Muscle weakness (generalized) (M62.81);Difficulty in walking, not elsewhere classified (R26.2) ?  ? ? ?Time: 3888-2800 ?PT Time Calculation (min) (ACUTE ONLY): 24 min ? ?Charges:  $Gait Training: 8-22 mins          ?          ? ?Jamesetta Geralds, PT, DPT ?[facility] Rehabilitation Department ?Office: [phone] ?Pager: [phone] ? ? ?Jamesetta Geralds ?08/16/2021, 11:21 AM ? ?

## 2021-08-28 NOTE — Discharge Summary (Signed)
Physician Discharge Summary  ? ?Patient ID: ?Walter Grater, Walter [name] ?MRN: [id number] ?DOB/AGE: 1975/11/01 46 y.o. ? ?Admit date: 08/15/2021 ?Discharge date: 08/16/2021 ? ?Primary Diagnosis: Osteoarthritis of left hip ? ?Admission Diagnoses:  ?Past Medical History:  ?Diagnosis Date  ? Arthritis   ? Cardiomyopathy ([place])   ? Dysrhythmia   ? Hypothyroidism   ? Persistent atrial fibrillation ([place])   ? Typical atrial flutter ([place])   ? ?Discharge Diagnoses:   ?Principal Problem: ?  Primary osteoarthritis of left hip ? ?Estimated body mass index is 26.64 kg/m? as calculated from the following: ?  Height as of this encounter: 5\' 11"  (1.803 m). ?  Weight as of this encounter: 86.6 kg. ? ?Procedure:  ?Procedure(s) (LRB): ?TOTAL HIP ARTHROPLASTY ANTERIOR APPROACH (Left)  ? ?Consults: None ? ?HPI: Walter Grater, Walter [name] is a 46 y.o. male who has advanced end-  ?stage arthritis of their Left  hip with progressively worsening pain and  ?dysfunction.The patient has failed nonoperative management and presents for  ?total hip arthroplasty.  ? ?Laboratory Data: ?Admission on 08/15/2021, Discharged on 08/16/2021  ?Component Date Value Ref Range Status  ? ABO/RH(D) 08/15/2021    Final  ?                 Value:O POS ?Performed at [facility], [place] [address], [place], [place] [address] ?  ? WBC 08/16/2021 12.5 (H)  4.0 - 10.5 K/uL Final  ? RBC 08/16/2021 3.25 (L)  4.22 - 5.81 MIL/uL Final  ? Hemoglobin 08/16/2021 10.4 (L)  13.0 - 17.0 g/dL Final  ? HCT 08/16/2021 30.9 (L)  39.0 - 52.0 % Final  ? MCV 08/16/2021 95.1  80.0 - 100.0 fL Final  ? MCH 08/16/2021 32.0  26.0 - 34.0 pg Final  ? MCHC 08/16/2021 33.7  30.0 - 36.0 g/dL Final  ? RDW 08/16/2021 13.0  11.5 - 15.5 % Final  ? Platelets 08/16/2021 268  150 - 400 K/uL Final  ? nRBC 08/16/2021 0.0  0.0 - 0.2 % Final  ? Performed at [facility], [place] [address] [place], [place] [address]  ? Sodium 08/16/2021 131 (L)  135 - 145 mmol/L Final  ? Potassium 08/16/2021 4.2   3.5 - 5.1 mmol/L Final  ? Chloride 08/16/2021 103  98 - 111 mmol/L Final  ? CO2 08/16/2021 23  22 - 32 mmol/L Final  ? Glucose, Bld 08/16/2021 147 (H)  70 - 99 mg/dL Final  ? Glucose reference range applies only to samples taken after fasting for at least 8 hours.  ? BUN 08/16/2021 15  6 - 20 mg/dL Final  ? Creatinine, Ser 08/16/2021 0.85  0.61 - 1.24 mg/dL Final  ? Calcium 08/16/2021 8.5 (L)  8.9 - 10.3 mg/dL Final  ? GFR, Estimated 08/16/2021 >60  >60 mL/min Final  ? Comment: (NOTE) ?Calculated using the CKD-EPI Creatinine Equation (2021) ?  ? Anion gap 08/16/2021 5  5 - 15 Final  ? Performed at [facility], [place] [address], [place], [place] [address]  ?Appointment on 08/12/2021  ?Component Date Value Ref Range Status  ? S' Lateral 08/12/2021 3.40  cm Final  ?Hospital Outpatient Visit on 08/03/2021  ?Component Date Value Ref Range Status  ? MRSA, PCR 08/03/2021 NEGATIVE  NEGATIVE Final  ? Staphylococcus aureus 08/03/2021 NEGATIVE  NEGATIVE Final  ? Comment: (NOTE) ?The Xpert SA Assay (FDA approved for NASAL specimens in patients 69 ?years of age and older), is one component of  a comprehensive ?surveillance program. It is not intended to diagnose infection nor to ?guide or monitor treatment. ?Performed at [facility], [place] [address] ?[place], [place] [address] ?  ? WBC 08/03/2021 4.3  4.0 - 10.5 K/uL Final  ? RBC 08/03/2021 3.47 (L)  4.22 - 5.81 MIL/uL Final  ? Hemoglobin 08/03/2021 11.1 (L)  13.0 - 17.0 g/dL Final  ? HCT 08/03/2021 33.2 (L)  39.0 - 52.0 % Final  ? MCV 08/03/2021 95.7  80.0 - 100.0 fL Final  ? MCH 08/03/2021 32.0  26.0 - 34.0 pg Final  ? MCHC 08/03/2021 33.4  30.0 - 36.0 g/dL Final  ? RDW 08/03/2021 13.1  11.5 - 15.5 % Final  ? Platelets 08/03/2021 288  150 - 400 K/uL Final  ? nRBC 08/03/2021 0.0  0.0 - 0.2 % Final  ? Performed at [facility], [place] [address] [place], [place] [address]  ? Sodium 08/03/2021 132 (L)  135 - 145 mmol/L Final   ? Potassium 08/03/2021 4.7  3.5 - 5.1 mmol/L Final  ? Chloride 08/03/2021 104  98 - 111 mmol/L Final  ? CO2 08/03/2021 22  22 - 32 mmol/L Final  ? Glucose, Bld 08/03/2021 107 (H)  70 - 99 mg/dL Final  ? Glucose reference range applies only to samples taken after fasting for at least 8 hours.  ? BUN 08/03/2021 12  6 - 20 mg/dL Final  ? Creatinine, Ser 08/03/2021 0.92  0.61 - 1.24 mg/dL Final  ? Calcium 08/03/2021 8.7 (L)  8.9 - 10.3 mg/dL Final  ? Total Protein 08/03/2021 7.0  6.5 - 8.1 g/dL Final  ? Albumin 08/03/2021 4.3  3.5 - 5.0 g/dL Final  ? AST 08/03/2021 21  15 - 41 U/L Final  ? ALT 08/03/2021 16  0 - 44 U/L Final  ? Alkaline Phosphatase 08/03/2021 46  38 - 126 U/L Final  ? Total Bilirubin 08/03/2021 0.5  0.3 - 1.2 mg/dL Final  ? GFR, Estimated 08/03/2021 >60  >60 mL/min Final  ? Comment: (NOTE) ?Calculated using the CKD-EPI Creatinine Equation (2021) ?  ? Anion gap 08/03/2021 6  5 - 15 Final  ? Performed at [facility], [place] [address] [place], [place] [address]  ? ABO/RH(D) 08/03/2021 O POS   Final  ? Antibody Screen 08/03/2021 NEG   Final  ? Sample Expiration 08/03/2021 08/17/2021,2359   Final  ? Extend sample reason 08/03/2021    Final  ?                 Value:NO TRANSFUSIONS OR PREGNANCY IN THE PAST 3 MONTHS ?Performed at [facility], [place] [address], [place], [place] [address] ?  ?  ? ?X-Rays:DG Pelvis Portable ? ?Result Date: 08/15/2021 ?CLINICAL DATA:  Status post left hip arthroplasty EXAM: PORTABLE PELVIS 1-2 VIEWS COMPARISON:  None. FINDINGS: There is evidence of recent left hip arthroplasty. There is faint radiolucent line in the medial cortex of proximal shaft of left femur immediately below the lesser trochanter. No similar finding is seen in the right femur. There are pockets of air in the soft tissues. IMPRESSION: Status post left hip arthroplasty. Faint thin linear lucency is seen in the medial cortex of proximal shaft of left femur immediately below the  lesser trochanter. This may represent undisplaced fracture. Less likely possibility would be nutrient foramen. These results will be called to the ordering clinician or representative by the Radiologist Assistant, and communication documented in the PACS  or [employer]. Electronically Signed   By: Elmer Picker M.D.   On: 08/15/2021 16:27  ? ?DG C-Arm 1-60 Min-No Report ? ?Result Date: 08/15/2021 ?Fluoroscopy was utilized by the requesting physician.  No radiographic interpretation.  ? ?DG C-Arm 1-60 Min-No Report ? ?Result Date: 08/15/2021 ?Fluoroscopy was utilized by the requesting physician.  No radiographic interpretation.  ? ?ECHOCARDIOGRAM COMPLETE ? ?Result Date: 08/12/2021 ?   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 Patient Age:    48 years          BP:           124/68 mmHg Patient Gender: M                 HR:           114 bpm. Exam Location:  [address] Procedure: 2D Echo, Cardiac Doppler and Color Doppler Indications:    I42.9 Cardiomyopathy (unspecified); Preoperative clearance  History:        Patient has prior history of Echocardiogram examinations, most                 recent 11/13/2019. Arrythmias:Atrial Fibrillation and Atrial                 Flutter; Risk Factors:Hypertension. Ablation. NICM.                 Hypothyroidism.  Sonographer:    Diamond Nickel RCS Referring Phys: [place]  1. Left ventricular ejection fraction, by estimation, is 45 to 50% but may be underestimated in setting of atrial fibrillation and significant septal/lateral dysynchrony from BBB. The left ventricle has mildly decreased function. The left ventricle demonstrates global hypokinesis. There is mild concentric left ventricular hypertrophy. Left ventricular diastolic function  could not be evaluated.  2. Right ventricular systolic function is normal. The right ventricular size is normal.  3. The mitral valve is normal in structure. Mild mitral valve regurgitation. No evidence o

## 2021-09-03 ENCOUNTER — Encounter (HOSPITAL_COMMUNITY): Payer: Self-pay | Admitting: *Deleted

## 2021-09-03 ENCOUNTER — Other Ambulatory Visit: Payer: Self-pay

## 2021-09-03 ENCOUNTER — Inpatient Hospital Stay (HOSPITAL_COMMUNITY)
Admission: EM | Admit: 2021-09-03 | Discharge: 2021-09-07 | DRG: 481 | Disposition: A | Discharge status: Home / Self Care | Payer: Managed Care, Other (non HMO) | Attending: Family Medicine | Admitting: Family Medicine

## 2021-09-03 ENCOUNTER — Emergency Department (HOSPITAL_COMMUNITY): Payer: Managed Care, Other (non HMO)

## 2021-09-03 DIAGNOSIS — I110 Hypertensive heart disease with heart failure: Secondary | ICD-10-CM | POA: Diagnosis present

## 2021-09-03 DIAGNOSIS — E039 Hypothyroidism, unspecified: Secondary | ICD-10-CM | POA: Diagnosis not present

## 2021-09-03 DIAGNOSIS — Y92008 Other place in unspecified non-institutional (private) residence as the place of occurrence of the external cause: Secondary | ICD-10-CM | POA: Diagnosis not present

## 2021-09-03 DIAGNOSIS — S72092A Other fracture of head and neck of left femur, initial encounter for closed fracture: Secondary | ICD-10-CM | POA: Diagnosis present

## 2021-09-03 DIAGNOSIS — I4891 Unspecified atrial fibrillation: Secondary | ICD-10-CM | POA: Diagnosis not present

## 2021-09-03 DIAGNOSIS — I088 Other rheumatic multiple valve diseases: Secondary | ICD-10-CM | POA: Diagnosis not present

## 2021-09-03 DIAGNOSIS — E063 Autoimmune thyroiditis: Secondary | ICD-10-CM | POA: Diagnosis present

## 2021-09-03 DIAGNOSIS — N179 Acute kidney failure, unspecified: Secondary | ICD-10-CM | POA: Diagnosis present

## 2021-09-03 DIAGNOSIS — W010XXA Fall on same level from slipping, tripping and stumbling without subsequent striking against object, initial encounter: Secondary | ICD-10-CM | POA: Diagnosis present

## 2021-09-03 DIAGNOSIS — I428 Other cardiomyopathies: Secondary | ICD-10-CM | POA: Diagnosis present

## 2021-09-03 DIAGNOSIS — I4821 Permanent atrial fibrillation: Secondary | ICD-10-CM | POA: Diagnosis present

## 2021-09-03 DIAGNOSIS — M9702XA Periprosthetic fracture around internal prosthetic left hip joint, initial encounter: Secondary | ICD-10-CM | POA: Diagnosis present

## 2021-09-03 DIAGNOSIS — E038 Other specified hypothyroidism: Secondary | ICD-10-CM

## 2021-09-03 DIAGNOSIS — S72002A Fracture of unspecified part of neck of left femur, initial encounter for closed fracture: Secondary | ICD-10-CM | POA: Diagnosis not present

## 2021-09-03 DIAGNOSIS — Z79899 Other long term (current) drug therapy: Secondary | ICD-10-CM

## 2021-09-03 DIAGNOSIS — Z79890 Hormone replacement therapy (postmenopausal): Secondary | ICD-10-CM

## 2021-09-03 DIAGNOSIS — I480 Paroxysmal atrial fibrillation: Secondary | ICD-10-CM | POA: Diagnosis not present

## 2021-09-03 DIAGNOSIS — Z7901 Long term (current) use of anticoagulants: Secondary | ICD-10-CM | POA: Diagnosis not present

## 2021-09-03 DIAGNOSIS — D72829 Elevated white blood cell count, unspecified: Secondary | ICD-10-CM | POA: Diagnosis not present

## 2021-09-03 DIAGNOSIS — I10 Essential (primary) hypertension: Secondary | ICD-10-CM | POA: Diagnosis not present

## 2021-09-03 DIAGNOSIS — I509 Heart failure, unspecified: Secondary | ICD-10-CM | POA: Diagnosis present

## 2021-09-03 LAB — CBC WITH DIFFERENTIAL/PLATELET
Abs Immature Granulocytes: 0.11 10*3/uL — ABNORMAL HIGH (ref 0.00–0.07)
Basophils Absolute: 0.1 10*3/uL (ref 0.0–0.1)
Basophils Relative: 1 %
Eosinophils Absolute: 0.1 10*3/uL (ref 0.0–0.5)
Eosinophils Relative: 0 %
HCT: 34.3 % — ABNORMAL LOW (ref 39.0–52.0)
Hemoglobin: 11.4 g/dL — ABNORMAL LOW (ref 13.0–17.0)
Immature Granulocytes: 1 %
Lymphocytes Relative: 16 %
Lymphs Abs: 2.1 10*3/uL (ref 0.7–4.0)
MCH: 30 pg (ref 26.0–34.0)
MCHC: 33.2 g/dL (ref 30.0–36.0)
MCV: 90.3 fL (ref 80.0–100.0)
Monocytes Absolute: 1.4 10*3/uL — ABNORMAL HIGH (ref 0.1–1.0)
Monocytes Relative: 11 %
Neutro Abs: 9.2 10*3/uL — ABNORMAL HIGH (ref 1.7–7.7)
Neutrophils Relative %: 71 %
Platelets: 538 10*3/uL — ABNORMAL HIGH (ref 150–400)
RBC: 3.8 MIL/uL — ABNORMAL LOW (ref 4.22–5.81)
RDW: 13.2 % (ref 11.5–15.5)
WBC: 12.9 10*3/uL — ABNORMAL HIGH (ref 4.0–10.5)
nRBC: 0 % (ref 0.0–0.2)

## 2021-09-03 LAB — BASIC METABOLIC PANEL
Anion gap: 12 (ref 5–15)
BUN: 15 mg/dL (ref 6–20)
CO2: 23 mmol/L (ref 22–32)
Calcium: 9.3 mg/dL (ref 8.9–10.3)
Chloride: 101 mmol/L (ref 98–111)
Creatinine, Ser: 1.19 mg/dL (ref 0.61–1.24)
GFR, Estimated: >60 mL/min (ref >60)
Glucose, Bld: 103 mg/dL — ABNORMAL HIGH (ref 70–99)
Potassium: 4 mmol/L (ref 3.5–5.1)
Sodium: 136 mmol/L (ref 135–145)

## 2021-09-03 MED ORDER — MORPHINE SULFATE (PF) 4 MG/ML IV SOLN
4.0000 mg | INTRAVENOUS | Status: DC | PRN
  Administered 2021-09-03 – 2021-09-05 (×9): 4 mg via INTRAVENOUS
  Filled 2021-09-03 (×9): qty 1

## 2021-09-03 MED ORDER — MORPHINE SULFATE (PF) 4 MG/ML IV SOLN
4.0000 mg | Freq: Once | INTRAVENOUS | Status: AC
  Administered 2021-09-03: 4 mg via INTRAVENOUS
  Filled 2021-09-03: qty 1

## 2021-09-03 MED ORDER — ONDANSETRON HCL 4 MG/2ML IJ SOLN: 4.0000 mg | Freq: Four times a day (QID) | INTRAMUSCULAR | Status: DC | PRN

## 2021-09-03 MED ORDER — DILTIAZEM HCL ER BEADS 240 MG PO CP24: 240.0000 mg | ORAL_CAPSULE | Freq: Every day | ORAL | Status: DC

## 2021-09-03 MED ORDER — DILTIAZEM HCL ER COATED BEADS 120 MG PO CP24
240.0000 mg | ORAL_CAPSULE | Freq: Once | ORAL | Status: AC
Start: 2021-09-03 — End: 2021-09-03
  Administered 2021-09-03: 240 mg via ORAL
  Filled 2021-09-03: qty 2

## 2021-09-03 MED ORDER — DILTIAZEM HCL 25 MG/5ML IV SOLN
10.0000 mg | Freq: Once | INTRAVENOUS | Status: AC
  Administered 2021-09-03: 10 mg via INTRAVENOUS
  Filled 2021-09-03 (×2): qty 5

## 2021-09-03 MED ORDER — DILTIAZEM HCL-DEXTROSE 125-5 MG/125ML-% IV SOLN (PREMIX)
INTRAVENOUS | Status: AC
  Administered 2021-09-03: 5 mg/h via INTRAVENOUS
  Filled 2021-09-03: qty 125

## 2021-09-03 MED ORDER — NALOXONE HCL 0.4 MG/ML IJ SOLN: 0.4000 mg | INTRAMUSCULAR | Status: DC | PRN

## 2021-09-03 MED ORDER — LEVOTHYROXINE SODIUM 100 MCG PO TABS
100.0000 ug | ORAL_TABLET | Freq: Every day | ORAL | Status: DC
  Administered 2021-09-04 – 2021-09-05 (×2): 100 ug via ORAL
  Filled 2021-09-03 (×2): qty 1

## 2021-09-03 MED ORDER — DILTIAZEM HCL-DEXTROSE 125-5 MG/125ML-% IV SOLN (PREMIX)
5.0000 mg/h | INTRAVENOUS | Status: DC
  Administered 2021-09-04: 15 mg/h via INTRAVENOUS
  Filled 2021-09-03 (×2): qty 125

## 2021-09-03 MED ORDER — METOPROLOL SUCCINATE ER 50 MG PO TB24
50.0000 mg | ORAL_TABLET | Freq: Every day | ORAL | Status: DC
  Administered 2021-09-04 – 2021-09-05 (×2): 50 mg via ORAL
  Filled 2021-09-03 (×2): qty 1

## 2021-09-03 MED ORDER — ACETAMINOPHEN 325 MG PO TABS: 650.0000 mg | ORAL_TABLET | Freq: Four times a day (QID) | ORAL | Status: DC | PRN

## 2021-09-03 MED ORDER — ACETAMINOPHEN 650 MG RE SUPP: 650.0000 mg | Freq: Four times a day (QID) | RECTAL | Status: DC | PRN

## 2021-09-03 NOTE — ED Triage Notes (Signed)
Pt has pain in left hip, s/p hip surgery 08/15/21. Pain with external rotation noted ?

## 2021-09-03 NOTE — ED Notes (Signed)
Admitting provider aware of pt's current HR after medications at 1908.  Orders forthcoming.  ? ?

## 2021-09-03 NOTE — ED Provider Notes (Signed)
?[facility]-EMERGENCY DEPT ?Provider Note ? ? ?CSN: [id number] ?Arrival date & time: 09/03/21  1740 ? ?  ? ?History ? ?Chief Complaint  ?Patient presents with  ? Hip Pain  ? ? 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 ? ?46 year old male with prior medical history as detailed below presents for evaluation.  Patient reports that he fell from standing yesterday at home.  Patient with recent left total hip arthroplasty with Dr. Trudee Grip.  This was performed March 20. ? ?Patient reports that he was home yesterday.  He slipped on a wet wood floor.  He landed hard on his left hip.  He did not strike his head.  He did not pass out.  He reports significant pain to the left hip since the fall. ? 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  He and his wife (who is also a Sports administrator) are concerned about possible fracture.  ? ?The history is provided by the patient and medical records.  ?Hip Pain ?This is a new problem. The current episode started yesterday. The problem occurs rarely. The problem has not changed since onset.Nothing aggravates the symptoms. Nothing relieves the symptoms.  ? ?  ? ?Home Medications ?Prior to Admission medications   ?Medication Sig Start Date End Date Taking? Authorizing Provider  ?diltiazem (TIAZAC) 240 MG 24 hr capsule TAKE 1 CAPSULE BY MOUTH DAILY ?Patient taking differently: Take 240 mg by mouth daily. 07/04/21   Jake Bathe, MD  ?HYDROcodone-acetaminophen (NORCO/VICODIN) 5-325 MG tablet Take 1-2 tablets by mouth every 6 (six) hours as needed for severe pain. 08/16/21   Nelia Shi D, PA-C  ?levothyroxine (SYNTHROID) 100 MCG tablet Take 100 mcg by mouth daily before breakfast. 01/14/20   [provider]  ?methocarbamol (ROBAXIN) 500 MG tablet Take 1 tablet (500 mg total) by mouth every 6 (six) hours as needed for muscle spasms. 08/16/21   Nelia Shi D, PA-C  ?metoprolol succinate (TOPROL-XL) 50 MG 24 hr tablet TAKE 1 TABLET(50 MG) BY MOUTH DAILY  WITH OR IMMEDIATELY FOLLOWING A MEAL ?Patient taking differently: Take 50 mg by mouth daily. 06/20/21   Jake Bathe, MD  ?Polyethyl Glycol-Propyl Glycol (SYSTANE OP) Place 4-5 drops into both eyes 2 (two) times daily.     [provider]  ?rivaroxaban (XARELTO) 20 MG TABS tablet Take 1 tablet (20 mg total) by mouth daily with supper. ?Patient taking differently: Take 20 mg by mouth in the morning. 06/15/21   Dyann Kief, PA-C  ?telmisartan (MICARDIS) 20 MG tablet TAKE 1 TABLET BY MOUTH DAILY ?Patient taking differently: Take 20 mg by mouth daily. 07/27/21   Jake Bathe, MD  ?traMADol (ULTRAM) 50 MG tablet Take 1-2 tablets (50-100 mg total) by mouth every 6 (six) hours as needed for moderate pain. 08/16/21   Nelia Shi D, PA-C  ?   ? ?Allergies    ?Patient has no known allergies.   ? ?Review of Systems   ?Review of Systems  ?All other systems reviewed and are negative. ? ?Physical Exam ?Updated Vital Signs ?BP 113/87 (BP Location: Left Arm)   Temp 98 ?F (36.7 ?C) (Oral)   Resp 17   Ht 5\' 11"  (1.803 m)   Wt 86.3 kg   SpO2 94%   BMI 26.54 kg/m?  ?Physical Exam ?Vitals and nursing note reviewed.  ?Constitutional:   ?   General: He is not in acute distress. ?   Appearance: Normal appearance. He is well-developed.  ?  HENT:  ?   Head: Normocephalic and atraumatic.  ?Eyes:  ?   Conjunctiva/sclera: Conjunctivae normal.  ?   Pupils: Pupils are equal, round, and reactive to light.  ?Cardiovascular:  ?   Rate and Rhythm: Normal rate and regular rhythm.  ?   Heart sounds: Normal heart sounds.  ?Pulmonary:  ?   Effort: Pulmonary effort is normal. No respiratory distress.  ?   Breath sounds: Normal breath sounds.  ?Abdominal:  ?   General: There is no distension.  ?   Palpations: Abdomen is soft.  ?   Tenderness: There is no abdominal tenderness.  ?Musculoskeletal:     ?   General: Tenderness present. No deformity. Normal range of motion.  ?   Cervical back: Normal range of motion and neck supple.  ?    Comments: Significant tenderness to palpation overlying the left proximal lateral thigh  ?Skin: ?   General: Skin is warm and dry.  ?Neurological:  ?   General: No focal deficit present.  ?   Mental Status: He is alert and oriented to person, place, and time.  ? ? ?ED Results / Procedures / Treatments   ?Labs ?(all labs ordered are listed, but only abnormal results are displayed) ?Labs Reviewed  ?CBC WITH DIFFERENTIAL/PLATELET - Abnormal; Notable for the following components:  ?    Result Value  ? WBC 12.9 (*)   ? RBC 3.80 (*)   ? Hemoglobin 11.4 (*)   ? HCT 34.3 (*)   ? Platelets 538 (*)   ? Neutro Abs 9.2 (*)   ? Monocytes Absolute 1.4 (*)   ? Abs Immature Granulocytes 0.11 (*)   ? All other components within normal limits  ?BASIC METABOLIC PANEL  ? ? ?EKG ?None ? ?Radiology ?DG Hip Unilat W or Wo Pelvis 2-3 Views Left ? ?Result Date: 09/03/2021 ?CLINICAL DATA:  Larey Seat yesterday with left hip pain. EXAM: DG HIP (WITH OR WITHOUT PELVIS) 2-3V LEFT COMPARISON:  None. FINDINGS: There is an acute fracture of the proximal femur along the prosthetic femoral stem. IMPRESSION: Acute fracture of the proximal femur along the prosthetic femoral stem. Electronically Signed   By: Paulina Fusi M.D.   On: 09/03/2021 18:34   ? ?Procedures ?Procedures  ? ? ?Medications Ordered in ED ?Medications  ?morphine (PF) 4 MG/ML injection 4 mg (has no administration in time range)  ? ? ?ED Course/ Medical Decision Making/ A&P ?  ?                        ?Medical Decision Making ?Amount and/or Complexity of Data Reviewed ?Labs: ordered. ?Radiology: ordered. ? ?Risk ?Prescription drug management. ? ? ? ?Medical Screen Complete ? ?This patient presented to the ED with complaint of left hip pain. ? ?This complaint involves an extensive number of treatment options. The initial differential diagnosis includes, but is not limited to, fracture secondary to fall ? ?This presentation is: Acute, Self-Limited, Previously Undiagnosed, Uncertain Prognosis,  and Complicated ? ?Patient is presenting with complaint of left hip pain after fall yesterday. ? ?Patient did not strike his head.  Patient without LOC.  Patient without headache or other neurologic symptom.  Patient offered CT imaging of his brain, he declines. ? ?Patient with left periprosthetic fracture in the femur. ? ?Dr. Odis Hollingshead (ortho) aware of case and will consult.  ? ?Hospitalist service is aware of case and will evaluate for admission. ? ?Of note, patient with noted A-fib with RVR  at time of admission.  Patient reports that he did not take his p.o. dose of Cardizem this evening.  Post dose of IV Cardizem given and p.o. dose of Cardizem ordered.  Patient without symptoms related to his RVR. ? ? ? ?Additional history obtained: ? ?Additional history obtained from Spouse ?External records from outside sources obtained and reviewed including prior ED visits and prior Inpatient records.  ? ? ?Lab Tests: ? ?I ordered and personally interpreted labs.  The pertinent results include:  CBC BMP ? ? ?Imaging Studies ordered: ? ?I ordered imaging studies including Left Hip ?I independently visualized and interpreted obtained imaging which showed left femur fracture ?I agree with the radiologist interpretation. ? ? ?Cardiac Monitoring: ? ?The patient was maintained on a cardiac monitor.  I personally viewed and interpreted the cardiac monitor which showed an underlying rhythm of: atrial fib with RVR ? ? ?Medicines ordered: ? ?I ordered medication including cardizem, morphine  for AFIB with RVR and pain  ?Reevaluation of the patient after these medicines showed that the patient: improved ? ? ? ?Problem List / ED Course: ? ?Left femur fracture, A-fib with RVR ? ? ?Reevaluation: ? ?After the interventions noted above, I reevaluated the patient and found that they have: improved ? ? ?Disposition: ? ?After consideration of the diagnostic results and the patients response to treatment, I feel that the patent would benefit  from admission.  ? ? ? ? ? ? ? ? ?Final Clinical Impression(s) / ED Diagnoses ?Final diagnoses:  ?Closed fracture of left hip, initial encounter (HCC)  ?Atrial fibrillation with RVR (HCC)  ? ? ?Rx / DC Orders ?ED D

## 2021-09-04 ENCOUNTER — Inpatient Hospital Stay (HOSPITAL_COMMUNITY): Payer: Managed Care, Other (non HMO)

## 2021-09-04 DIAGNOSIS — I480 Paroxysmal atrial fibrillation: Secondary | ICD-10-CM | POA: Diagnosis present

## 2021-09-04 DIAGNOSIS — D72829 Elevated white blood cell count, unspecified: Secondary | ICD-10-CM | POA: Diagnosis present

## 2021-09-04 DIAGNOSIS — N179 Acute kidney failure, unspecified: Secondary | ICD-10-CM | POA: Diagnosis present

## 2021-09-04 LAB — COMPREHENSIVE METABOLIC PANEL
ALT: 14 U/L (ref 0–44)
AST: 29 U/L (ref 15–41)
Albumin: 3.8 g/dL (ref 3.5–5.0)
Alkaline Phosphatase: 68 U/L (ref 38–126)
Anion gap: 9 (ref 5–15)
BUN: 16 mg/dL (ref 6–20)
CO2: 27 mmol/L (ref 22–32)
Calcium: 9 mg/dL (ref 8.9–10.3)
Chloride: 98 mmol/L (ref 98–111)
Creatinine, Ser: 0.97 mg/dL (ref 0.61–1.24)
GFR, Estimated: >60 mL/min (ref >60)
Glucose, Bld: 115 mg/dL — ABNORMAL HIGH (ref 70–99)
Potassium: 4 mmol/L (ref 3.5–5.1)
Sodium: 134 mmol/L — ABNORMAL LOW (ref 135–145)
Total Bilirubin: 1 mg/dL (ref 0.3–1.2)
Total Protein: 7.2 g/dL (ref 6.5–8.1)

## 2021-09-04 LAB — MAGNESIUM
Magnesium: 2.4 mg/dL (ref 1.7–2.4)
Magnesium: 2.1 mg/dL (ref 1.7–2.4)

## 2021-09-04 LAB — CBC WITH DIFFERENTIAL/PLATELET
Abs Immature Granulocytes: 0.05 10*3/uL (ref 0.00–0.07)
Basophils Absolute: 0.1 10*3/uL (ref 0.0–0.1)
Basophils Relative: 1 %
Eosinophils Absolute: 0.1 10*3/uL (ref 0.0–0.5)
Eosinophils Relative: 1 %
HCT: 31.8 % — ABNORMAL LOW (ref 39.0–52.0)
Hemoglobin: 10.6 g/dL — ABNORMAL LOW (ref 13.0–17.0)
Immature Granulocytes: 1 %
Lymphocytes Relative: 15 %
Lymphs Abs: 1.4 10*3/uL (ref 0.7–4.0)
MCH: 30.5 pg (ref 26.0–34.0)
MCHC: 33.3 g/dL (ref 30.0–36.0)
MCV: 91.4 fL (ref 80.0–100.0)
Monocytes Absolute: 1.3 10*3/uL — ABNORMAL HIGH (ref 0.1–1.0)
Monocytes Relative: 14 %
Neutro Abs: 6.6 10*3/uL (ref 1.7–7.7)
Neutrophils Relative %: 68 %
Platelets: 461 10*3/uL — ABNORMAL HIGH (ref 150–400)
RBC: 3.48 MIL/uL — ABNORMAL LOW (ref 4.22–5.81)
RDW: 13.1 % (ref 11.5–15.5)
WBC: 9.4 10*3/uL (ref 4.0–10.5)
nRBC: 0 % (ref 0.0–0.2)

## 2021-09-04 LAB — SODIUM, URINE, RANDOM: Sodium, Ur: 59 mmol/L

## 2021-09-04 LAB — URINALYSIS, COMPLETE (UACMP) WITH MICROSCOPIC
Bacteria, UA: NONE SEEN
Bilirubin Urine: NEGATIVE
Glucose, UA: NEGATIVE mg/dL
Hgb urine dipstick: NEGATIVE
Ketones, ur: 20 mg/dL — AB
Leukocytes,Ua: NEGATIVE
Nitrite: NEGATIVE
Protein, ur: NEGATIVE mg/dL
Specific Gravity, Urine: 1.01 (ref 1.005–1.030)
pH: 6 (ref 5.0–8.0)

## 2021-09-04 LAB — TSH: TSH: 5.133 u[IU]/mL — ABNORMAL HIGH (ref 0.350–4.500)

## 2021-09-04 LAB — CK: Total CK: 615 U/L — ABNORMAL HIGH (ref 49–397)

## 2021-09-04 LAB — CREATININE, URINE, RANDOM: Creatinine, Urine: 82.18 mg/dL

## 2021-09-04 LAB — PROTIME-INR
INR: 1.2 (ref 0.8–1.2)
Prothrombin Time: 15.2 seconds (ref 11.4–15.2)

## 2021-09-04 MED ORDER — METHOCARBAMOL 500 MG PO TABS
500.0000 mg | ORAL_TABLET | Freq: Three times a day (TID) | ORAL | Status: DC | PRN
  Administered 2021-09-04 – 2021-09-05 (×2): 500 mg via ORAL
  Filled 2021-09-04 (×2): qty 1

## 2021-09-04 MED ORDER — DILTIAZEM HCL ER COATED BEADS 240 MG PO CP24
240.0000 mg | ORAL_CAPSULE | Freq: Every day | ORAL | Status: DC
  Administered 2021-09-04 – 2021-09-05 (×2): 240 mg via ORAL
  Filled 2021-09-04 (×2): qty 1

## 2021-09-04 NOTE — Assessment & Plan Note (Signed)
? ?#)   hypothyroidism: documented h/o such in the setting Hashimoto's thyroiditis, on Synthroid as outpatient.  In the setting of presenting atrial fibrillation with RVR, also check TSH. ? ?Plan: cont home Synthroid.  Check TSH. ? ? ?

## 2021-09-04 NOTE — Assessment & Plan Note (Signed)
? ?#)   Leukocytosis: Presenting CBC reflects mildly elevated white cell count of 12,900. Suspect that this is reactive in nature in the setting of presenting ground-level mechanical fall as well as associated acute left periprosthetic hip fracture.  No evidence to suggest underlying infectious process at this time. Will check urinalysis and CXR to further evaluate for any underlying infectious contribution, with the studies also providing useful diagnostic information as pertains to presenting acute kidney injury and atrial fibrillation with RVR. ? ?Plan: Check urinalysis, as above.  Repeat CBC with diff in the morning. CXR.  ? ? ?

## 2021-09-04 NOTE — Assessment & Plan Note (Signed)
? ?#)   Acute left periprosthetic fracture: In the setting of recent left total hip arthroplasty on 08/15/2021, presents with acute left hip pain following mechanical ground-level fall without associated loss of consciousness that occurred yesterday with ensuing plain films of the/femur demonstrating evidence of acute fracture along the prosthetic femoral stem, without abnormality distal to that.  At this time, the  lower extremity appears neurovascularly intact, and patient reports adequate pain control.  Chronically anticoagulated on Xarelto in the setting of paroxysmal atrial fibrillation, with most recent such dose occurring on the morning of 09/02/2021.  Otherwise, no additional blood thinners at home, including no aspirin. ? ?EDP discussed the patient's case and imaging with the on-call orthopedic surgeon for [facility], Dr.  Odis Hollingshead, who recommended admission to the hospitalist service for further evaluation and management of acute left periprosthetic hip fracture.  Dr. Odis Hollingshead to formally consult, will evaluate the patient in the morning, and anticipates surgical correction via Dr. Lequita Halt to occur on 09/05/21.  ? ?Chales Abrahams Score for this patient in the context of anticipated aforementioned orthopedic surgery conveys a  0.11% perioperative risk for significant cardiac event. No evidence to suggest acutely decompensated heart failure or acute MI. Consequently, no absolute contraindications to proceeding with proposed orthopedic surgery at this time. ? ? ? ?Plan: Formal orthopedic surgery consult for definitive surgical management. No pharmacologic anticoagulation leading up to this anticipated surgery. SCD's. Prn IV morphine. Anticipate postoperative PT consult. Check INR.  Holding home Xarelto for now. ? ? ?

## 2021-09-04 NOTE — H&P (Signed)
?History and Physical  ? ? ?PLEASE NOTE THAT DRAGON DICTATION SOFTWARE WAS USED IN THE CONSTRUCTION OF THIS NOTE. ? ? ?Walter Riggs, MD TMM:[id number] DOB: Aug 19, 1975 DOA: 09/03/2021 ? ?PCP: Olive Bass, FNP  ?Patient coming from: home  ? ?I have personally briefly reviewed patient's old medical records in [facility] ? ?Chief Complaint: left hip pain ? 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  ? ?In the setting of primary left hip osteoarthritis, the patient underwent left total hip arthroplasty with Dr. Lequita Halt On 08/15/2021.  Yesterday, 09/02/2021, the patient reports tripping while ambulate on the hard wood flood at home, resulting in a ground level fall during which left hip was the principal point of contact with the floor below. As a result of this fall, the patient reports immediate development of sharp left hip pain, with radiation into the  left  groin. States that this pain has been constant since onset, with exacerbation when attempting to move the  left lower extremity.  As a consequence of the associated intensity of this discomfort, reports that he is unable to bear weight on the left lower extremity at this time.  Otherwise, denies any acute arthralgias or myalgias as a result of the above fall.  Denies any acute numbness or paresthesias in bilateral lower extremities.  Overnight and into this morning, patient reports no significant improvement in his left hip discomfort, resulting in his presentation to [facility] emergency department today for further evaluation and management thereof. ? ?Did  not hit head as a component of this fall, and denies any associated loss of consciousness.  Denies any preceding or associated chest pain, shortness of breath, diaphoresis, palpitations, nausea, vomiting, dizziness, presyncope, or syncope.  Denies any subsequent headache, neck pain, blurry vision, or diplopia.  In the setting of a history of paroxysmal atrial fibrillation, he is chronically anticoagulated on Xarelto, with most recent dose of such occurring on the morning of 09/02/2021.  Otherwise, no additional blood thinners, including no aspirin as an outpatient.  Outpatient AV nodal blocking regimen assistive diltiazem 240 mg p.o. daily as well as metoprolol succinate, patient noting that most recent doses of these 2 medications occurred on 09/02/21. ? ?Denies any known history of coronary artery disease or CHF. denies any recent orthopnea, PND, or peripheral edema.  ? ?Per chart review, most recent echocardiogram occurred on 08/12/2021 was notable for LVEF 45 to 50%, mild concentric LVH, indeterminate diastolic parameters, normal right ventricular systolic function, and mild mitral regurgitation. ? ? ? ? ?ED Course:  ?Vital signs in the ED were notable for the following: Afebrile; initial heart rate will fibrillation with RVR-in the low 160s, which is decreased to the 140s following dose of oral diltiazem as well as single dose of IV diltiazem, as outlined below.  Following which diltiazem drip has been initiated, with ensuing heart rate into the 130s.  Blood pressure 133/78 - 154/98; 1618, oxygen saturation 99 to 100% on room air. ? ?Labs were notable for the following: BMP notable for potassium sodium 136, potassium 4.0, creatinine 1.19 compared to most recent prior value 0.85 on 08/16/2021.  CBC notable for  white blood cell count 12,900, hemoglobin 11.321 3, platelet count 538. ? ?Imaging and additional notable ED work-up: EKG shows atrial fibrillation with RVR, single PVC, ventricular rate 166, no evidence of T  wave or ST changes, including no evidence of ST elevation.  Plain films of the left hip/femur show acute fracture prosthetic femoral stem, without distal to this. ? ?EDP discussed the patient's case and imaging with the on-call orthopedic surgeon for [facility], Dr.  Odis Hollingshead, who recommended admission to the hospitalist service for further evaluation and management of acute left periprosthetic hip fracture.  Dr. Odis Hollingshead to formally consult, will evaluate the patient in the morning, and anticipates surgical correction via Dr. Lequita Halt to occur on 09/05/21.  ? ?While in the ED, the following were administered: Diltiazem 240 mg p.o. x1, diltiazem 10 mg IV x1 followed by initiation of diltiazem drip; morphine 4 mg IV x2 doses. ? ?Subsequently, the patient was admitted for further evaluation and management of acute left periprosthetic fracture following ground-level mechanical fall with presentation also notable for atrial fibrillation with RVR, laboratory evaluation notable for acute kidney injury as well as leukocytosis. ? ? ?Review of Systems: As per HPI otherwise 10 point review of systems negative.  ? ?Past Medical History:  ?Diagnosis Date  ? Arthritis   ? Cardiomyopathy (HCC)   ? Dysrhythmia   ? Hypothyroidism   ? Persistent atrial fibrillation (HCC)   ? Typical atrial flutter (HCC)   ? ? ?Past Surgical History:  ?Procedure Laterality Date  ? CARDIOVERSION N/A 10/01/2017  ? Procedure: CARDIOVERSION;  Surgeon: Lewayne Bunting, MD;  Location: [facility] ENDOSCOPY;  Service: Cardiovascular;  Laterality: N/A;  ? NO PAST SURGERIES    ? TOTAL HIP ARTHROPLASTY Left 08/15/2021  ? Procedure: TOTAL HIP ARTHROPLASTY ANTERIOR APPROACH;  Surgeon: Ollen Gross, MD;  Location: [facility] ORS;  Service: Orthopedics;  Laterality: Left;  ? ? ?Social History: ? reports that he has never smoked. He has never used smokeless tobacco. He reports that he does not currently use alcohol. He reports that he does not use drugs. ? ? ?No Known  Allergies ? ?Family History  ?Problem Relation Age of Onset  ? Atrial fibrillation Maternal Grandmother   ? Heart failure Neg Hx   ? ? ?Family history reviewed and not pertinent  ? ? ?Prior to Admission medications   ?Medication Sig Start Date End Date Taking? Authorizing Provider  ?diltiazem (TIAZAC) 240 MG 24 hr capsule TAKE 1 CAPSULE BY MOUTH DAILY ?Patient taking differently: Take 240 mg by mouth daily. 07/04/21  Yes Jake Bathe, MD  ?HYDROcodone-acetaminophen (NORCO/VICODIN) 5-325 MG tablet Take 1-2 tablets by mouth every 6 (six) hours as needed for severe pain. 08/16/21  Yes Nelia Shi D, PA-C  ?levothyroxine (SYNTHROID) 100 MCG tablet Take 100 mcg by mouth daily before breakfast. 01/14/20  Yes [provider]  ?methocarbamol (ROBAXIN) 500 MG tablet Take 1 tablet (500 mg total) by mouth every 6 (six) hours as needed for muscle spasms. 08/16/21  Yes Nelia Shi D, PA-C  ?metoprolol succinate (TOPROL-XL) 50 MG 24 hr tablet TAKE 1 TABLET(50 MG) BY MOUTH DAILY WITH OR IMMEDIATELY FOLLOWING A MEAL ?Patient taking differently: Take 50 mg by mouth daily. 06/20/21  Yes Jake Bathe, MD  ?Polyethyl Glycol-Propyl Glycol (SYSTANE OP) Place 4-5 drops into both eyes 2 (two) times daily.    Yes [provider]  ?rivaroxaban (XARELTO) 20 MG TABS tablet Take 1 tablet (20 mg total) by mouth daily with supper. ?Patient taking differently:  Take 20 mg by mouth daily. 06/15/21  Yes Dyann Kief, PA-C  ?telmisartan (MICARDIS) 20 MG tablet TAKE 1 TABLET BY MOUTH DAILY ?Patient taking differently: Take 20 mg by mouth daily. 07/27/21  Yes Jake Bathe, MD  ?traMADol (ULTRAM) 50 MG tablet Take 1-2 tablets (50-100 mg total) by mouth every 6 (six) hours as needed for moderate pain. ?Patient not taking: Reported on 09/03/2021 08/16/21   Alyssa Grove, PA-C  ? ? ? ?Objective  ? ? ?Physical Exam: ?Vitals:  ? 09/03/21 2200 09/03/21 2215 09/03/21 2300 09/03/21 2330  ?BP: 138/81  132/90 (!) 124/94  ?Pulse: 71 (!)  112  75  ?Resp: 15 15 15 16   ?Temp:      ?TempSrc:      ?SpO2: 99% 96% 93% 98%  ?Weight:      ?Height:      ? ? ?General: appears to be stated age; alert, oriented ?Skin: warm, dry, no rash ?Head:  AT/[place] ?[place]

## 2021-09-04 NOTE — Progress Notes (Signed)
?PROGRESS NOTE ? ? ? ?Seward Grater, MD  BY:[id number] DOB: 03/07/1976 DOA: 09/03/2021 ?PCP: Marrian Salvage, [place]  ?Outpatient Specialists:  ? ? ? ?Brief Narrative:  ?Patient is a 46 year old male with past medical history significant for paroxysmal atrial fibrillation on Xarelto, hypothyroidism secondary to Hashimoto's thyroiditis and osteoarthritis of the left hip status post left hip arthroplasty on 08/15/2021.  Patient was admitted following a fall at home with subsequent left hip periprosthetic fracture.  The plan is to proceed with the orthopedic surgery tomorrow.  Patient was also in A-fib with RVR on presentation.  RVR has resolved.  Patient is back to his oral Cardizem. ? ? ?Assessment & Plan: ?  ?Principal Problem: ?  Closed left hip fracture ([place]) ?Active Problems: ?  Hypothyroidism ?  Paroxysmal atrial fibrillation with RVR ([place]) ?  AKI (acute kidney injury) ([place]) ?  Leukocytosis ? ? ?Acute left periprosthetic hip fracture: ?-Patient had a mechanical fall at home. ?-For surgery tomorrow.  -Orthopedic input is appreciated. ? ?Leukocytosis: ?-Likely reactive. ? ?Atrial fibrillation with RVR: ?-Patient was initially on Cardizem drip. ?-RVR has resolved. ?-Transition patient back to Cardizem XL to 40 Mg p.o. once daily. ?-Anticoagulation is on hold as patient will be going for hip surgery tomorrow. ? ?Mild acute kidney injury: ?-Resolved. ?-Continue to monitor renal function and electrolytes. ? ?Hypothyroidism: ?-History of Hashimoto's thyroiditis. ?-Continue Synthroid. ?-TSH was 5.133.  Repeat in 4-6 weeks. ? ? ? ?DVT prophylaxis: Patient was on Xarelto prior to admission. ?Code Status: Full code. ?Family Communication:  ?Disposition Plan: This will depend on the hospital course ? ? ?Consultants:  ?Orthopedic surgery ? ?Procedures:  ?None ? ?Antimicrobials:  ?None ? ? ?Subjective: ?Left hip pain. ? ?Objective: ?Vitals:  ? 09/03/21 2330 09/04/21 0144 09/04/21 0500 09/04/21 [id number]  ?BP: (!) 124/94 (!) 142/95   (!) 126/94  ?Pulse: 75 89  70  ?Resp: 16 20  20   ?Temp:  98.7 ?F (37.1 ?C)  98 ?F (36.7 ?C)  ?TempSrc:  Oral  Oral  ?SpO2: 98% 100%  100%  ?Weight:   80.7 kg   ?Height:      ? ? ?Intake/Output Summary (Last 24 hours) at 09/04/2021 1507 ?Last data filed at 09/04/2021 [id number] ?Gross per 24 hour  ?Intake 91.4 ml  ?Output 225 ml  ?Net -133.6 ml  ? ?Filed Weights  ? 09/03/21 1758 09/04/21 0500  ?Weight: 86.3 kg 80.7 kg  ? ? ?Examination: ? ?General exam: Appears calm and comfortable ?HEENT: Patient is PERRLA.  No jaundice. ?Neck: Supple.  No raised JVD. ?Respiratory system: Clear to auscultation. Respiratory effort normal. ?Cardiovascular system: S1 & S2, irregularly irregular.   ?Gastrointestinal system: Abdomen is nondistended, soft and nontender. No organomegaly or masses felt. Normal bowel sounds heard. ?Central nervous system: Alert and oriented.  Patient moves all extremities.   ?Extremities: Swelling of the left hip area.  No lower leg edema. ? ?Data Reviewed: I have personally reviewed following labs and imaging studies ? ?CBC: ?Recent Labs  ?Lab 09/03/21 ?1804 09/04/21 ?0403  ?WBC 12.9* 9.4  ?NEUTROABS 9.2* 6.6  ?HGB 11.4* 10.6*  ?HCT 34.3* 31.8*  ?MCV 90.3 91.4  ?PLT 538* 461*  ? ?Basic Metabolic Panel: ?Recent Labs  ?Lab 09/03/21 ?1804 09/03/21 ?1927 09/04/21 ?0403  ?NA 136  --  134*  ?K 4.0  --  4.0  ?CL 101  --  98  ?CO2 23  --  27  ?GLUCOSE 103*  --  115*  ?BUN 15  --  16  ?  CREATININE 1.19  --  0.97  ?CALCIUM 9.3  --  9.0  ?MG  --  2.4 2.1  ? ?GFR: ?Estimated Creatinine Clearance: 99.2 mL/min (by C-G formula based on SCr of 0.97 mg/dL). ?Liver Function Tests: ?Recent Labs  ?Lab 09/04/21 ?0403  ?AST 29  ?ALT 14  ?ALKPHOS 68  ?BILITOT 1.0  ?PROT 7.2  ?ALBUMIN 3.8  ? ?No results for input(s): LIPASE, AMYLASE in the last 168 hours. ?No results for input(s): AMMONIA in the last 168 hours. ?Coagulation Profile: ?Recent Labs  ?Lab 09/04/21 ?0403  ?INR 1.2  ? ?Cardiac Enzymes: ?Recent Labs  ?Lab 09/04/21 ?0403  ?CKTOTAL  615*  ? ?BNP (last 3 results) ?No results for input(s): PROBNP in the last 8760 hours. ?HbA1C: ?No results for input(s): HGBA1C in the last 72 hours. ?CBG: ?No results for input(s): GLUCAP in the last 168 hours. ?Lipid Profile: ?No results for input(s): CHOL, HDL, LDLCALC, TRIG, CHOLHDL, LDLDIRECT in the last 72 hours. ?Thyroid Function Tests: ?Recent Labs  ?  09/04/21 ?0403  ?TSH 5.133*  ? ?Anemia Panel: ?No results for input(s): VITAMINB12, FOLATE, FERRITIN, TIBC, IRON, RETICCTPCT in the last 72 hours. ?Urine analysis: ?   ?Component Value Date/Time  ? [place] YELLOW 09/04/2021 0046  ? APPEARANCEUR CLEAR 09/04/2021 0046  ? LABSPEC 1.010 09/04/2021 0046  ? PHURINE 6.0 09/04/2021 0046  ? GLUCOSEU NEGATIVE 09/04/2021 0046  ? [place] NEGATIVE 09/04/2021 0046  ? [place] NEGATIVE 09/04/2021 0046  ? KETONESUR 20 (A) 09/04/2021 0046  ? [place] NEGATIVE 09/04/2021 0046  ? NITRITE NEGATIVE 09/04/2021 0046  ? LEUKOCYTESUR NEGATIVE 09/04/2021 0046  ? ?Sepsis Labs: ?@[email or web address] (procalcitonin:4,lacticidven:4) ? ?)No results found for this or any previous visit (from the past 240 hour(s)).  ? ? ? ? ? ?Radiology Studies: ?DG Chest Port 1 View ? ?Result Date: 09/04/2021 ?CLINICAL DATA:  Hip fracture EXAM: PORTABLE CHEST 1 VIEW COMPARISON:  09/01/2015 FINDINGS: The heart size and mediastinal contours are within normal limits. Both lungs are clear. The visualized skeletal structures are unremarkable. IMPRESSION: No active disease. Electronically Signed   By: Donavan Foil M.D.   On: 09/04/2021 00:47  ? ?DG Hip Unilat W or Wo Pelvis 2-3 Views Left ? ?Result Date: 09/03/2021 ?CLINICAL DATA:  [place] yesterday with left hip pain. EXAM: DG HIP (WITH OR WITHOUT PELVIS) 2-3V LEFT COMPARISON:  None. FINDINGS: There is an acute fracture of the proximal femur along the prosthetic femoral stem. IMPRESSION: Acute fracture of the proximal femur along the prosthetic femoral stem. Electronically Signed   By: Nelson Chimes M.D.   On: 09/03/2021  18:34  ? ?DG Femur Min 2 Views Left ? ?Result Date: 09/03/2021 ?CLINICAL DATA:  [place] yesterday with pain. EXAM: LEFT FEMUR 2 VIEWS COMPARISON:  None. FINDINGS: Acute fracture along the prosthetic femoral stem. No fracture distal to that. No pelvic bone fracture. IMPRESSION: Acute fracture along the prosthetic femoral stem. No abnormality distal to that. Electronically Signed   By: Nelson Chimes M.D.   On: 09/03/2021 18:35   ? ? ? ? ? ?Scheduled Meds: ? diltiazem  240 mg Oral Daily  ? levothyroxine  100 mcg Oral Q0600  ? metoprolol succinate  50 mg Oral Daily  ? ?Continuous Infusions: ? diltiazem (CARDIZEM) infusion 15 mg/hr (09/04/21 0434)  ? ? ? LOS: 1 day  ? ? ?Time spent: 35 minutes. ? ? ? ?Dana Allan, MD  ?[facility] ?Pager #: [phone] ?7PM-7AM contact night coverage as above ? ? ?  ?

## 2021-09-04 NOTE — Consult Note (Addendum)
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  Admit date: 09/03/2021  Admission Diagnoses:  Principal Problem:   Closed left hip fracture (HCC) Active Problems:   Atrial fibrillation with RVR (HCC)   Hypothyroidism   Paroxysmal atrial fibrillation with RVR (HCC)   AKI (acute kidney injury) (HCC)   Leukocytosis   HPI: Ortho consult for acute left proximal femur periprosthetic fracture. Injury on 09/02/21 evening when he slipped wearing socks on wooden flooring. He noticed worsening pain over subsequent day prompting him to seek ED evaluation. Imaging confirmed aforementioned fracture and he was also noted to be in atrial fibrillation with RVR. His rate was controlled in the ED and he was admitted to the [facility] hospitalist team for perioperative management. His last dose of Xarelto was Friday morning. He is generally otherwise healthy with PMH notable for hypothyroidism. He denies numbness/tingling.  His primary left THA was done on 08/15/21 by Dr. Ollen Gross at [facility]. He recovered well during his immediate postoperative period and was doing excellent as outpatient. He did not have any antecedent pain prior to his fall on Friday. He was even able to ambulate fully without assistive device and returned to all routine activities. 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  Past Medical History: Past Medical History:  Diagnosis Date   Arthritis    Cardiomyopathy (HCC)    Dysrhythmia    Hypothyroidism    Persistent atrial fibrillation (HCC)    Typical atrial flutter (HCC)     Surgical History: Past Surgical History:  Procedure Laterality Date   CARDIOVERSION N/A 10/01/2017   Procedure: CARDIOVERSION;  Surgeon: Lewayne Bunting, MD;  Location: [facility] ENDOSCOPY;  Service: Cardiovascular;  Laterality: N/A;   NO PAST SURGERIES     TOTAL HIP ARTHROPLASTY Left 08/15/2021   Procedure: TOTAL HIP ARTHROPLASTY ANTERIOR APPROACH;  Surgeon: Ollen Gross, MD;  Location: [facility] ORS;  Service: Orthopedics;  Laterality: Left;    Family History: Family History  Problem Relation Age of Onset   Atrial fibrillation Maternal Grandmother    Heart failure Neg Hx     Social History: Social History   Socioeconomic History   Marital status: Single    Spouse name: Not on file   Number of children: Not on file   Years of education: Not on file   Highest education level: Not on file  Occupational History   Not on file  Tobacco Use   Smoking status: Never   Smokeless tobacco: Never  Vaping Use   Vaping Use: Never used  Substance and Sexual Activity   Alcohol use: Not Currently    Comment: occ   Drug use: No   Sexual activity: Not on file  Other Topics Concern   Not on file  Social History Narrative   Lives in [place] alone.  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  Medications: I have reviewed the patient's current medications.  Vital Signs: Patient Vitals for the past 24 hrs:  BP Temp Temp src Pulse Resp SpO2 Height Weight  09/04/21 1626  125/86 98.6 F (37 C) Oral 98 18 100 % -- --  09/04/21 0638 (!) 126/94 98 F (36.7 C) Oral 70 20 100 % -- --  09/04/21 0500 -- -- -- -- -- -- -- 80.7 kg  09/04/21 0144 (!) 142/95 98.7 F (37.1 C) Oral 89 20 100 % -- --  09/03/21 2330 (!) 124/94 -- -- 75 16 98 % -- --  09/03/21 2300 132/90 -- -- -- 15 93 % -- --  09/03/21 2215 -- -- -- (!) 112 15 96 % -- --  09/03/21 2200 138/81 -- -- 71 15 99 % -- --  09/03/21 2145 (!) 134/97 -- -- 86 (!) 21 100 % -- --  09/03/21 2130 (!) 154/98 -- -- (!) 119 16 100 % -- --  09/03/21 2115  (!) 142/95 -- -- 74 15 100 % -- --  09/03/21 2104 -- -- -- (!) 126 20 99 % -- --  09/03/21 2100 (!) 130/97 -- -- (!) 105 19 99 % -- --  09/03/21 2050 -- -- -- (!) 43 18 99 % -- --  09/03/21 2045 (!) 136/91 -- -- (!) 46 18 99 % -- --  09/03/21 2008 -- -- -- (!) 59 19 99 % -- --  09/03/21 2007 -- -- -- (!) 36 (!) 21 99 % -- --  09/03/21 2000 -- -- -- 92 (!) 21 99 % -- --  09/03/21 1945 (!) 129/95 -- -- 60 (!) 21 100 % -- --  09/03/21 1930 (!) 121/95 -- -- 66 16 100 % -- --  09/03/21 1925 -- -- -- -- 19 -- -- --  09/03/21 1915 (!) 121/96 -- -- -- (!) 22 -- -- --  09/03/21 1903 -- -- -- -- -- 94 % -- --  09/03/21 1900 (!) 127/97 -- -- -- 14 -- -- --  09/03/21 1810 133/78 -- -- 98 17 94 % -- --  09/03/21 1758 -- -- -- -- -- -- 5\' 11"  (1.803 m) 86.3 kg    Radiology: DG Pelvis Portable  Result Date: 08/15/2021 CLINICAL DATA:  Status post left hip arthroplasty EXAM: PORTABLE PELVIS 1-2 VIEWS COMPARISON:  None. FINDINGS: There is evidence of recent left hip arthroplasty. There is faint radiolucent line in the medial cortex of proximal shaft of left femur immediately below the lesser trochanter. No similar finding is seen in the right femur. There are pockets of air in the soft tissues. IMPRESSION: Status post left hip arthroplasty. Faint thin linear lucency is seen in the medial cortex of proximal shaft of left femur immediately below the lesser trochanter. This may represent undisplaced fracture. Less likely possibility would be nutrient foramen. These results will be called to the ordering clinician or representative by the Radiologist Assistant, and communication documented in the PACS or [employer]. Electronically Signed   By: Ernie Avena M.D.   On: 08/15/2021 16:27   DG Chest Port 1 View  Result Date: 09/04/2021 CLINICAL DATA:  Hip fracture EXAM: PORTABLE CHEST 1 VIEW COMPARISON:  09/01/2015 FINDINGS: The heart size and mediastinal contours are within normal limits. Both lungs are  clear. The visualized skeletal structures are unremarkable. IMPRESSION: No active disease. Electronically Signed   By: Jasmine Pang M.D.   On: 09/04/2021 00:47   DG C-Arm 1-60 Min-No Report  Result Date: 08/15/2021 Fluoroscopy was utilized by the requesting physician.  No radiographic interpretation.   DG C-Arm 1-60 Min-No Report  Result Date: 08/15/2021 Fluoroscopy was utilized by the requesting physician.  No radiographic interpretation.   ECHOCARDIOGRAM COMPLETE  Result Date: 08/12/2021    ECHOCARDIOGRAM REPORT   Patient Name:   [name] Date of Exam: 08/12/2021 Medical Rec #:  [id number]         Height:       71.0 in Accession #:    [id number]        Weight:       191.0 lb Date of Birth:  [birth year]          BSA:          2.068 m Patient Age:    45 years          BP:           124/68 mmHg Patient Gender: M                 HR:           114 bpm. Exam Location:  [address] Procedure: 2D Echo, Cardiac Doppler and Color Doppler Indications:    I42.9 Cardiomyopathy (unspecified); Preoperative clearance  History:        Patient has prior history of Echocardiogram examinations, most                 recent 11/13/2019. Arrythmias:Atrial Fibrillation and Atrial                 Flutter; Risk Factors:Hypertension. Ablation. NICM.                 Hypothyroidism.  Sonographer:    Cathie Beams RCS Referring Phys: [id number] MICHELE M LENZE IMPRESSIONS  1. Left ventricular ejection fraction, by estimation, is 45 to 50% but may be underestimated in setting of atrial fibrillation and significant septal/lateral dysynchrony from BBB. The left ventricle has mildly decreased function. The left ventricle demonstrates global hypokinesis. There is mild concentric left ventricular hypertrophy. Left ventricular diastolic function could not be evaluated.  2. Right ventricular systolic function is normal. The right ventricular size is normal.  3. The mitral valve is normal in structure. Mild mitral valve regurgitation. No  evidence of mitral stenosis.  4. The aortic valve is normal in structure. Aortic valve regurgitation is trivial. No aortic stenosis is present.  5. Aortic dilatation noted. There is mild dilatation of the aortic root, measuring 41 mm.  6. The inferior vena cava is normal in size with greater than 50% respiratory variability, suggesting right atrial pressure of 3 mmHg. FINDINGS  Left Ventricle: Left ventricular ejection fraction, by estimation, is 45 to 50%. The left ventricle has mildly decreased function. The left ventricle demonstrates global hypokinesis. The left ventricular internal cavity size was normal in size. There is  mild concentric left ventricular hypertrophy. Abnormal (paradoxical) septal motion, consistent with left bundle branch block. Left ventricular diastolic function could not be evaluated due to atrial fibrillation. Left ventricular diastolic function could not be evaluated. Right Ventricle: The right ventricular size is normal. No increase in right ventricular wall thickness. Right ventricular systolic function is normal. Left Atrium: Left atrial size was normal in size. Right Atrium: Right atrial size was normal in size. Pericardium: There is no evidence of pericardial effusion. Mitral Valve: The mitral valve is normal in structure. Mild mitral valve regurgitation. No evidence of mitral valve stenosis. Tricuspid Valve: The tricuspid valve is normal in structure. Tricuspid valve regurgitation is mild . No evidence of tricuspid stenosis. Aortic Valve: The aortic valve is normal in structure. Aortic valve regurgitation is trivial. No aortic stenosis is  present. Pulmonic Valve: The pulmonic valve was normal in structure. Pulmonic valve regurgitation is trivial. No evidence of pulmonic stenosis. Aorta: Aortic dilatation noted. There is mild dilatation of the aortic root, measuring 41 mm. Venous: The inferior vena cava is normal in size with greater than 50% respiratory variability, suggesting right  atrial pressure of 3 mmHg. IAS/Shunts: No atrial level shunt detected by color flow Doppler.  LEFT VENTRICLE PLAX 2D LVIDd:         4.20 cm LVIDs:         3.40 cm LV PW:         1.30 cm LV IVS:        1.30 cm LVOT diam:     2.55 cm LV SV:         61 LV SV Index:   30 LVOT Area:     5.11 cm  RIGHT VENTRICLE RV Basal diam:  3.30 cm RV S prime:     12.90 cm/s TAPSE (M-mode): 2.2 cm LEFT ATRIUM             Index        RIGHT ATRIUM           Index LA diam:        3.60 cm 1.74 cm/m   RA Area:     18.30 cm LA Vol (A2C):   56.6 ml 27.37 ml/m  RA Volume:   49.30 ml  23.84 ml/m LA Vol (A4C):   44.5 ml 21.52 ml/m LA Biplane Vol: 54.3 ml 26.26 ml/m  AORTIC VALVE LVOT Vmax:   70.23 cm/s LVOT Vmean:  50.033 cm/s LVOT VTI:    0.120 m  AORTA Ao Root diam: 4.10 cm Ao Asc diam:  3.50 cm  SHUNTS Systemic VTI:  0.12 m Systemic Diam: 2.55 cm Armanda Magic MD Electronically signed by Armanda Magic MD Signature Date/Time: 08/12/2021/8:41:28 AM    Final    DG HIP UNILAT WITH PELVIS 1V LEFT  Result Date: 08/15/2021 CLINICAL DATA:  Fluoroscopic assistance for left hip arthroplasty EXAM: DG HIP (WITH OR WITHOUT PELVIS) 1V*L* COMPARISON:  None. FINDINGS: Fluoroscopic images show left hip arthroplasty. No fracture or dislocation is seen. Fluoroscopic time was 6 seconds. Estimated radiation dose was 0.79 mGy. IMPRESSION: Fluoroscopic assistance was provided for left hip arthroplasty. Electronically Signed   By: Ernie Avena M.D.   On: 08/15/2021 15:25   DG Hip Unilat W or Wo Pelvis 2-3 Views Left  Result Date: 09/03/2021 CLINICAL DATA:  Larey Seat yesterday with left hip pain. EXAM: DG HIP (WITH OR WITHOUT PELVIS) 2-3V LEFT COMPARISON:  None. FINDINGS: There is an acute fracture of the proximal femur along the prosthetic femoral stem. IMPRESSION: Acute fracture of the proximal femur along the prosthetic femoral stem. Electronically Signed   By: Paulina Fusi M.D.   On: 09/03/2021 18:34   DG Femur Min 2 Views Left  Result Date:  09/03/2021 CLINICAL DATA:  [place] Seat yesterday with pain. EXAM: LEFT FEMUR 2 VIEWS COMPARISON:  None. FINDINGS: Acute fracture along the prosthetic femoral stem. No fracture distal to that. No pelvic bone fracture. IMPRESSION: Acute fracture along the prosthetic femoral stem. No abnormality distal to that. Electronically Signed   By: Paulina Fusi M.D.   On: 09/03/2021 18:35    Labs: Recent Labs    09/03/21 1804 09/04/21 0403  WBC 12.9* 9.4  RBC 3.80* 3.48*  HCT 34.3* 31.8*  PLT 538* 461*   Recent Labs    09/03/21 1804 09/04/21 0403  NA 136 134*  K 4.0 4.0  CL 101 98  CO2 23 27  BUN 15 16  CREATININE 1.19 0.97  GLUCOSE 103* 115*  CALCIUM 9.3 9.0   Recent Labs    09/04/21 0403  INR 1.2    Review of Systems: ROS as detailed in HPI  Physical Exam: Body mass index is 24.81 kg/m.  Physical Exam  Gen: AAOx3, NAD Comfortable at rest  Left Lower Extremity: Skin intact Mild swelling over left thigh Compartments soft and compressible TTP over proximal left thigh Well healed incision corresponding to recent DA approach left hip ADF/APF/EHL 5/5 SILT throughout DP, PT 2+ to palp CR < 2s   Assessment and Plan: Ortho consult for 69 yr M with atrial fibrillation with left [place] B periprosthetic femur fracture in setting of recent primary left total hip arthroplasty (DOS 08/15/21)  -reviewed history, exam and imaging at length with patient -discussed with Dr. Lequita Halt; plan for surgery on Monday 09/05/21 at [facility] -please keep NPO from midnight and hold VTE prophylaxis (Xarelto last dose on Friday morning 09/02/21 per patient report) -primary team to provide perioperative optimization and clearance for planned procedure -NWB LLE due to left femur fracture -patient is on muscle relaxant at baseline, his home robaxin has been reordered to assist with pain control   Netta Cedars, MD Orthopaedic Surgeon [facility] [phone]

## 2021-09-04 NOTE — Assessment & Plan Note (Signed)
? ?#)   Acute Kidney Injury: Presenting serum creatinine compared to most recent prior value 0.85 on 08/16/2021.  While patient medications include telmisartan.  Of note, it does not appear that the patient has been on recent NSAIDs, even in the context of recent left total hip arthroplasty. ? ? ?Plan: monitor strict I's & O's and daily weights. Attempt to avoid nephrotoxic agents. Refrain from NSAIDs.  Hold home to losartan for now.  Repeat CMP in the morning. Check serum magnesium level.  Check urinalysis with microscopy add-on random urine sodium and random urine creatinine.  In the setting of recent orthopedic surgical procedure and presenting trauma to the left hip, will also check CPK level. ? ? ? ? ?

## 2021-09-04 NOTE — Assessment & Plan Note (Signed)
?#)   Atrial fibrillation with RVR: In the setting of a known history of paroxysmal atrial fibrillation, the patient presents today in atrial fibrillation with ventricular rates initially in the 160s.  As the patient had not yet received his daily doses of home AV nodal blocking medications, fevers resume diltiazem 240 mg p.o. x1 as well as a dose of diltiazem 10 mg IV in the emergency department.  An hour after receiving these medications, heart rate improved slightly, but remained elevated in the 140s prompting initiation of diltiazem drip, upon which heart rate continues to improve, most recently in the 130s.  Patient appears completely asymptomatic as it relates to his atrial fibrillation with RVR, including no chest pain, and blood pressures have tolerated the above heart rates as well as interval AV nodal blocking intervention. ? ?In terms of factors leading to this exacerbation, suspect contributions from stress as relates to pain from presenting acute left periprosthetic hip fracture and associated anxiety. Presenting EKG demonstrates no overt evidence of acute ischemic changes.  No clinical evidence at this time to suggest acutely decompensated heart failure, but will check chest x-ray to further evaluate.  ? ?Most recent echocardiogram occurred on 08/12/2021, as further detailed above. the patient is chronically anticoagulated on Xarelto, with most recent dose occurring on the morning of 09/02/2021. Home AV nodal blocking regimen: Diltiazem 240 mg p.o. daily as well as metoprolol succinate.  ? ? ? ?Plan: Monitor strict I's and O's and daily weights. Monitor on telemetry. Check serum magnesium level with prn supplementation to maintain levels of greater than or equal to 2.0. Repeat BMP in the AM.  Repeat CBC in the AM.  Continue diltiazem drip, titrated to goal HR of < 110 bpm, with close monitoring of ensuing blood pressure.  Plan for resumption of oral AV nodal blocking agents prior to discontinuation of  diltiazem drip to reduce risk for rebound tachycardia.  In the setting of plans for ensuing surgical correction of presenting left periprosthetic hip fracture, will hold home Xarelto for now, as above.  Check TSH.  Check urinalysis, chest x-ray. ? ? ?

## 2021-09-05 ENCOUNTER — Inpatient Hospital Stay (HOSPITAL_COMMUNITY): Payer: Managed Care, Other (non HMO) | Admitting: Certified Registered Nurse Anesthetist

## 2021-09-05 ENCOUNTER — Inpatient Hospital Stay (HOSPITAL_COMMUNITY): Payer: Managed Care, Other (non HMO)

## 2021-09-05 ENCOUNTER — Encounter (HOSPITAL_COMMUNITY): Payer: Self-pay | Admitting: Internal Medicine

## 2021-09-05 ENCOUNTER — Other Ambulatory Visit: Payer: Self-pay

## 2021-09-05 ENCOUNTER — Encounter (HOSPITAL_COMMUNITY)
Admission: EM | Disposition: A | Discharge status: Home / Self Care | Payer: Self-pay | Source: Home / Self Care | Attending: Internal Medicine

## 2021-09-05 DIAGNOSIS — E039 Hypothyroidism, unspecified: Secondary | ICD-10-CM

## 2021-09-05 DIAGNOSIS — I10 Essential (primary) hypertension: Secondary | ICD-10-CM

## 2021-09-05 DIAGNOSIS — M9702XA Periprosthetic fracture around internal prosthetic left hip joint, initial encounter: Secondary | ICD-10-CM

## 2021-09-05 DIAGNOSIS — I4891 Unspecified atrial fibrillation: Secondary | ICD-10-CM

## 2021-09-05 HISTORY — PX: ORIF FEMUR FRACTURE: SHX2119

## 2021-09-05 LAB — TYPE AND SCREEN
ABO/RH(D): O POS
Antibody Screen: NEGATIVE

## 2021-09-05 SURGERY — OPEN REDUCTION INTERNAL FIXATION (ORIF) DISTAL FEMUR FRACTURE
Anesthesia: Spinal | Site: Hip | Laterality: Left

## 2021-09-05 MED ORDER — CEFAZOLIN SODIUM-DEXTROSE 2-4 GM/100ML-% IV SOLN
2.0000 g | Freq: Four times a day (QID) | INTRAVENOUS | Status: AC
  Administered 2021-09-05 – 2021-09-06 (×2): 2 g via INTRAVENOUS
  Filled 2021-09-05 (×2): qty 100

## 2021-09-05 MED ORDER — ACETAMINOPHEN 325 MG PO TABS: 325.0000 mg | ORAL_TABLET | Freq: Four times a day (QID) | ORAL | Status: DC | PRN

## 2021-09-05 MED ORDER — HYDROMORPHONE HCL 1 MG/ML IJ SOLN: 0.2500 mg | INTRAMUSCULAR | Status: DC | PRN

## 2021-09-05 MED ORDER — HYDROCODONE-ACETAMINOPHEN 7.5-325 MG PO TABS
1.0000 | ORAL_TABLET | ORAL | Status: DC | PRN
  Filled 2021-09-05: qty 2

## 2021-09-05 MED ORDER — OXYCODONE HCL 5 MG PO TABS: 5.0000 mg | ORAL_TABLET | Freq: Once | ORAL | Status: DC | PRN

## 2021-09-05 MED ORDER — TRANEXAMIC ACID-NACL 1000-0.7 MG/100ML-% IV SOLN
1000.0000 mg | INTRAVENOUS | Status: AC
  Administered 2021-09-05: 1000 mg via INTRAVENOUS

## 2021-09-05 MED ORDER — METOPROLOL SUCCINATE ER 50 MG PO TB24
50.0000 mg | ORAL_TABLET | Freq: Every day | ORAL | Status: DC
  Administered 2021-09-06 – 2021-09-07 (×2): 50 mg via ORAL
  Filled 2021-09-05 (×2): qty 1

## 2021-09-05 MED ORDER — TRAMADOL HCL 50 MG PO TABS
50.0000 mg | ORAL_TABLET | Freq: Four times a day (QID) | ORAL | Status: DC | PRN
  Administered 2021-09-05: 50 mg via ORAL
  Administered 2021-09-06: 100 mg via ORAL
  Administered 2021-09-06: 50 mg via ORAL
  Filled 2021-09-05: qty 2
  Filled 2021-09-05 (×2): qty 1

## 2021-09-05 MED ORDER — POLYETHYLENE GLYCOL 3350 17 G PO PACK: 17.0000 g | PACK | Freq: Every day | ORAL | Status: DC | PRN

## 2021-09-05 MED ORDER — CEFAZOLIN SODIUM-DEXTROSE 2-4 GM/100ML-% IV SOLN
2.0000 g | Freq: Once | INTRAVENOUS | Status: AC
  Administered 2021-09-05: 2 g via INTRAVENOUS

## 2021-09-05 MED ORDER — ALBUMIN HUMAN 5 % IV SOLN
INTRAVENOUS | Status: AC
  Filled 2021-09-05: qty 250

## 2021-09-05 MED ORDER — PHENYLEPHRINE HCL (PRESSORS) 10 MG/ML IV SOLN
INTRAVENOUS | Status: AC
  Filled 2021-09-05: qty 1

## 2021-09-05 MED ORDER — 0.9 % SODIUM CHLORIDE (POUR BTL) OPTIME
TOPICAL | Status: DC | PRN
  Administered 2021-09-05: 1000 mL

## 2021-09-05 MED ORDER — MENTHOL 3 MG MT LOZG: 1.0000 | LOZENGE | OROMUCOSAL | Status: DC | PRN

## 2021-09-05 MED ORDER — PHENOL 1.4 % MT LIQD: 1.0000 | OROMUCOSAL | Status: DC | PRN

## 2021-09-05 MED ORDER — ACETAMINOPHEN 10 MG/ML IV SOLN
1000.0000 mg | Freq: Once | INTRAVENOUS | Status: AC
  Administered 2021-09-05: 1000 mg via INTRAVENOUS

## 2021-09-05 MED ORDER — PHENYLEPHRINE 40 MCG/ML (10ML) SYRINGE FOR IV PUSH (FOR BLOOD PRESSURE SUPPORT)
PREFILLED_SYRINGE | INTRAVENOUS | Status: DC | PRN
  Administered 2021-09-05 (×4): 80 ug via INTRAVENOUS

## 2021-09-05 MED ORDER — DEXAMETHASONE SODIUM PHOSPHATE 10 MG/ML IJ SOLN
10.0000 mg | Freq: Once | INTRAMUSCULAR | Status: AC
  Administered 2021-09-06: 10 mg via INTRAVENOUS
  Filled 2021-09-05: qty 1

## 2021-09-05 MED ORDER — MELATONIN 5 MG PO TABS
5.0000 mg | ORAL_TABLET | Freq: Once | ORAL | Status: AC
  Administered 2021-09-05: 5 mg via ORAL
  Filled 2021-09-05: qty 1

## 2021-09-05 MED ORDER — PROPOFOL 500 MG/50ML IV EMUL
INTRAVENOUS | Status: DC | PRN
  Administered 2021-09-05: 75 ug/kg/min via INTRAVENOUS
  Administered 2021-09-05: 60 mg via INTRAVENOUS

## 2021-09-05 MED ORDER — LACTATED RINGERS IV SOLN: INTRAVENOUS | Status: DC

## 2021-09-05 MED ORDER — METHOCARBAMOL 500 MG PO TABS
500.0000 mg | ORAL_TABLET | Freq: Four times a day (QID) | ORAL | Status: DC | PRN
  Administered 2021-09-05 – 2021-09-07 (×4): 500 mg via ORAL
  Filled 2021-09-05 (×4): qty 1

## 2021-09-05 MED ORDER — STERILE WATER FOR IRRIGATION IR SOLN
Status: DC | PRN
  Administered 2021-09-05: 2000 mL

## 2021-09-05 MED ORDER — CEFAZOLIN SODIUM-DEXTROSE 2-4 GM/100ML-% IV SOLN
INTRAVENOUS | Status: AC
  Filled 2021-09-05: qty 100

## 2021-09-05 MED ORDER — DEXAMETHASONE SODIUM PHOSPHATE 10 MG/ML IJ SOLN
INTRAMUSCULAR | Status: DC | PRN
  Administered 2021-09-05: 10 mg via INTRAVENOUS

## 2021-09-05 MED ORDER — METOCLOPRAMIDE HCL 5 MG PO TABS: 5.0000 mg | ORAL_TABLET | Freq: Three times a day (TID) | ORAL | Status: DC | PRN

## 2021-09-05 MED ORDER — PROPOFOL 10 MG/ML IV BOLUS
INTRAVENOUS | Status: AC
  Filled 2021-09-05: qty 20

## 2021-09-05 MED ORDER — ONDANSETRON HCL 4 MG/2ML IJ SOLN: 4.0000 mg | Freq: Four times a day (QID) | INTRAMUSCULAR | Status: DC | PRN

## 2021-09-05 MED ORDER — PHENYLEPHRINE HCL-NACL 20-0.9 MG/250ML-% IV SOLN
INTRAVENOUS | Status: DC | PRN
  Administered 2021-09-05: 50 ug/min via INTRAVENOUS

## 2021-09-05 MED ORDER — FLEET ENEMA 7-19 GM/118ML RE ENEM: 1.0000 | ENEMA | Freq: Once | RECTAL | Status: DC | PRN

## 2021-09-05 MED ORDER — TRANEXAMIC ACID-NACL 1000-0.7 MG/100ML-% IV SOLN
INTRAVENOUS | Status: AC
  Filled 2021-09-05: qty 100

## 2021-09-05 MED ORDER — FENTANYL CITRATE (PF) 100 MCG/2ML IJ SOLN
INTRAMUSCULAR | Status: AC
  Filled 2021-09-05: qty 2

## 2021-09-05 MED ORDER — ALBUMIN HUMAN 5 % IV SOLN: INTRAVENOUS | Status: DC | PRN

## 2021-09-05 MED ORDER — MORPHINE SULFATE (PF) 2 MG/ML IV SOLN
0.5000 mg | INTRAVENOUS | Status: DC | PRN
  Administered 2021-09-05: 1 mg via INTRAVENOUS
  Filled 2021-09-05: qty 1

## 2021-09-05 MED ORDER — DILTIAZEM HCL ER COATED BEADS 240 MG PO CP24
240.0000 mg | ORAL_CAPSULE | Freq: Every day | ORAL | Status: DC
  Administered 2021-09-06 – 2021-09-07 (×2): 240 mg via ORAL
  Filled 2021-09-05 (×2): qty 1

## 2021-09-05 MED ORDER — BUPIVACAINE IN DEXTROSE 0.75-8.25 % IT SOLN
INTRATHECAL | Status: DC | PRN
  Administered 2021-09-05: 2 mL via INTRATHECAL

## 2021-09-05 MED ORDER — LEVOTHYROXINE SODIUM 100 MCG PO TABS
100.0000 ug | ORAL_TABLET | Freq: Every day | ORAL | Status: DC
  Administered 2021-09-06: 100 ug via ORAL
  Filled 2021-09-05: qty 1

## 2021-09-05 MED ORDER — RIVAROXABAN 10 MG PO TABS
10.0000 mg | ORAL_TABLET | Freq: Every day | ORAL | Status: DC
  Administered 2021-09-06: 10 mg via ORAL
  Filled 2021-09-05: qty 1

## 2021-09-05 MED ORDER — DOCUSATE SODIUM 100 MG PO CAPS
100.0000 mg | ORAL_CAPSULE | Freq: Two times a day (BID) | ORAL | Status: DC
  Administered 2021-09-06: 100 mg via ORAL
  Filled 2021-09-05 (×3): qty 1

## 2021-09-05 MED ORDER — SODIUM CHLORIDE 0.9 % IV SOLN: INTRAVENOUS | Status: DC

## 2021-09-05 MED ORDER — MIDAZOLAM HCL 2 MG/2ML IJ SOLN
INTRAMUSCULAR | Status: AC
  Filled 2021-09-05: qty 2

## 2021-09-05 MED ORDER — ONDANSETRON HCL 4 MG/2ML IJ SOLN
4.0000 mg | Freq: Once | INTRAMUSCULAR | Status: AC | PRN
  Administered 2021-09-05: 4 mg via INTRAVENOUS

## 2021-09-05 MED ORDER — DIPHENHYDRAMINE HCL 12.5 MG/5ML PO ELIX: 12.5000 mg | ORAL_SOLUTION | ORAL | Status: DC | PRN

## 2021-09-05 MED ORDER — ACETAMINOPHEN 10 MG/ML IV SOLN
INTRAVENOUS | Status: AC
  Filled 2021-09-05: qty 100

## 2021-09-05 MED ORDER — METHOCARBAMOL 1000 MG/10ML IJ SOLN
500.0000 mg | Freq: Four times a day (QID) | INTRAVENOUS | Status: DC | PRN
  Filled 2021-09-05: qty 5

## 2021-09-05 MED ORDER — CHLORHEXIDINE GLUCONATE CLOTH 2 % EX PADS
6.0000 | MEDICATED_PAD | Freq: Every day | CUTANEOUS | Status: DC
  Administered 2021-09-05 – 2021-09-07 (×3): 6 via TOPICAL

## 2021-09-05 MED ORDER — HYDROCODONE-ACETAMINOPHEN 5-325 MG PO TABS
1.0000 | ORAL_TABLET | ORAL | Status: DC | PRN
  Administered 2021-09-06: 2 via ORAL
  Filled 2021-09-05: qty 2

## 2021-09-05 MED ORDER — ONDANSETRON HCL 4 MG PO TABS
4.0000 mg | ORAL_TABLET | Freq: Four times a day (QID) | ORAL | Status: DC | PRN
Start: 2021-09-05 — End: 2021-09-07

## 2021-09-05 MED ORDER — METOCLOPRAMIDE HCL 5 MG/ML IJ SOLN: 5.0000 mg | Freq: Three times a day (TID) | INTRAMUSCULAR | Status: DC | PRN

## 2021-09-05 MED ORDER — MIDAZOLAM HCL 2 MG/2ML IJ SOLN
INTRAMUSCULAR | Status: DC | PRN
  Administered 2021-09-05: 2 mg via INTRAVENOUS

## 2021-09-05 MED ORDER — FENTANYL CITRATE (PF) 100 MCG/2ML IJ SOLN
INTRAMUSCULAR | Status: DC | PRN
Start: 2021-09-05 — End: 2021-09-05
  Administered 2021-09-05: 100 ug via INTRAVENOUS

## 2021-09-05 MED ORDER — OXYCODONE HCL 5 MG/5ML PO SOLN: 5.0000 mg | Freq: Once | ORAL | Status: DC | PRN

## 2021-09-05 MED ORDER — BISACODYL 10 MG RE SUPP
10.0000 mg | Freq: Every day | RECTAL | Status: DC | PRN
Start: 2021-09-05 — End: 2021-09-07

## 2021-09-05 SURGICAL SUPPLY — 51 items
AML 19.5 SML 150 LEN 43 ×2 IMPLANT
BAG COUNTER SPONGE SURGICOUNT (BAG) ×1 IMPLANT
BAG ZIPLOCK 12X15 (MISCELLANEOUS) ×1 IMPLANT
CABLE READY CERCLAGE W/CRIP (Trauma Fixation) ×4 IMPLANT
COVER SURGICAL LIGHT HANDLE (MISCELLANEOUS) ×2 IMPLANT
DRAPE 3/4 80X56 (DRAPES) ×1 IMPLANT
DRAPE INCISE IOBAN 66X45 STRL (DRAPES) ×2 IMPLANT
DRAPE ORTHO SPLIT 77X108 STRL (DRAPES) ×2
DRAPE POUCH INSTRU U-SHP 10X18 (DRAPES) ×1 IMPLANT
DRAPE SURG 17X11 SM STRL (DRAPES) ×2 IMPLANT
DRAPE SURG ORHT 6 SPLT 77X108 (DRAPES) ×2 IMPLANT
DRAPE U-SHAPE 47X51 STRL (DRAPES) ×2 IMPLANT
DRSG AQUACEL AG ADV 3.5X14 (GAUZE/BANDAGES/DRESSINGS) ×1 IMPLANT
DRSG EMULSION OIL 3X16 NADH (GAUZE/BANDAGES/DRESSINGS) ×2 IMPLANT
DRSG PAD ABDOMINAL 8X10 ST (GAUZE/BANDAGES/DRESSINGS) ×4 IMPLANT
DURAPREP 26ML APPLICATOR (WOUND CARE) ×2 IMPLANT
ELECT REM PT RETURN 15FT ADLT (MISCELLANEOUS) ×2 IMPLANT
EVACUATOR 1/8 PVC DRAIN (DRAIN) IMPLANT
FACESHIELD WRAPAROUND (MASK) ×8 IMPLANT
FACESHIELD WRAPAROUND OR TEAM (MASK) ×4 IMPLANT
GAUZE SPONGE 4X4 12PLY STRL (GAUZE/BANDAGES/DRESSINGS) ×4 IMPLANT
GLOVE BIO SURGEON STRL SZ 6.5 (GLOVE) ×4 IMPLANT
GLOVE BIO SURGEON STRL SZ7.5 (GLOVE) ×2 IMPLANT
GLOVE BIO SURGEON STRL SZ8 (GLOVE) ×4 IMPLANT
GLOVE BIOGEL PI IND STRL 7.0 (GLOVE) ×2 IMPLANT
GLOVE BIOGEL PI IND STRL 8 (GLOVE) ×1 IMPLANT
GLOVE BIOGEL PI INDICATOR 7.0 (GLOVE) ×2
GLOVE BIOGEL PI INDICATOR 8 (GLOVE) ×1
GLOVE SURG SS PI 7.0 STRL IVOR (GLOVE) ×2 IMPLANT
GOWN STRL REUS W/ TWL LRG LVL3 (GOWN DISPOSABLE) ×3 IMPLANT
GOWN STRL REUS W/TWL LRG LVL3 (GOWN DISPOSABLE) ×1
HEAD CERAMIC DELTA 36 PLUS 1.5 (Hips) ×1 IMPLANT
HIP AML 19.5 SML 150 LEN 43 IMPLANT
KIT BASIN OR (CUSTOM PROCEDURE TRAY) ×2 IMPLANT
KIT TURNOVER KIT A (KITS) ×1 IMPLANT
MANIFOLD NEPTUNE II (INSTRUMENTS) ×2 IMPLANT
NS IRRIG 1000ML POUR BTL (IV SOLUTION) ×4 IMPLANT
PACK TOTAL JOINT (CUSTOM PROCEDURE TRAY) ×2 IMPLANT
PADDING CAST COTTON 6X4 STRL (CAST SUPPLIES) ×2 IMPLANT
PROTECTOR NERVE ULNAR (MISCELLANEOUS) ×1 IMPLANT
SPONGE T-LAP 18X18 ~~LOC~~+RFID (SPONGE) ×3 IMPLANT
STAPLER VISISTAT 35W (STAPLE) IMPLANT
STRIP CLOSURE SKIN 1/2X4 (GAUZE/BANDAGES/DRESSINGS) ×1 IMPLANT
SUT MNCRL AB 4-0 PS2 18 (SUTURE) IMPLANT
SUT VIC AB 1 CT1 27 (SUTURE) ×3
SUT VIC AB 1 CT1 27XBRD ANTBC (SUTURE) ×3 IMPLANT
SUT VIC AB 2-0 CT1 27 (SUTURE) ×4
SUT VIC AB 2-0 CT1 TAPERPNT 27 (SUTURE) ×3 IMPLANT
TOWEL OR 17X26 10 PK STRL BLUE (TOWEL DISPOSABLE) ×3 IMPLANT
TRAY FOLEY MTR SLVR 16FR STAT (SET/KITS/TRAYS/PACK) ×1 IMPLANT
WATER STERILE IRR 1000ML POUR (IV SOLUTION) ×2 IMPLANT

## 2021-09-05 NOTE — Progress Notes (Signed)
?PROGRESS NOTE ? ? ? ?Seward Grater, MD  HQ:[id number] DOB: [birth year] DOA: 09/03/2021 ?PCP: Marrian Salvage, [place]  ?Outpatient Specialists:  ? ? ? ?Brief Narrative:  ?Patient is a 46 year old male with past medical history significant for paroxysmal atrial fibrillation on Xarelto, hypothyroidism secondary to Hashimoto's thyroiditis and osteoarthritis of the left hip status post left hip arthroplasty on 08/15/2021.  Patient was admitted following a fall at home with subsequent left hip periprosthetic fracture.  The plan is to proceed with the orthopedic surgery tomorrow.  Patient was also in A-fib with RVR on presentation.  RVR has resolved.  Patient is back to his oral Cardizem. ? ?09/05/2021: Patient seen.  No new changes.  Orthopedic input is highly appreciated.  Plan is to proceed with ORIF and stem revision by the orthopedic team. ? ? ?Assessment & Plan: ?  ?Principal Problem: ?  Closed left hip fracture ([place]) ?Active Problems: ?  Atrial fibrillation with RVR ([place]) ?  Hypothyroidism ?  Paroxysmal atrial fibrillation with RVR ([place]) ?  AKI (acute kidney injury) ([place]) ?  Leukocytosis ? ? ?Acute left periprosthetic hip fracture: ?-Patient had a mechanical fall at home. ?-For ORIF and stem revision today by the orthopedic ? ?Leukocytosis: ?-Resolved. ?-Likely reactive. ? ?Atrial fibrillation with RVR: ?-Patient was initially on Cardizem drip. ?-RVR has resolved. ?-Transition patient back to Cardizem XL to 240 Mg p.o. once daily. ?-Anticoagulation is on hold as patient will be going for hip surgery. ? ?Mild acute kidney injury: ?-Resolved. ?-Continue to monitor renal function and electrolytes. ? ?Hypothyroidism: ?-History of Hashimoto's thyroiditis. ?-Continue Synthroid. ?-TSH was 5.133.  Repeat in 4-6 weeks. ? ?DVT prophylaxis: Patient was on Xarelto prior to admission. ?Code Status: Full code. ?Family Communication:  ?Disposition Plan: This will depend on the hospital course ? ? ?Consultants:  ?Orthopedic  surgery ? ?Procedures:  ?None ? ?Antimicrobials:  ?None ? ? ?Subjective: ?Left hip pain. ? ?Objective: ?Vitals:  ? 09/05/21 0350 09/05/21 0500 09/05/21 1115 09/05/21 1122  ?BP: 120/77  118/83   ?Pulse: 61   89  ?Resp: 14     ?Temp: 98.3 ?F (36.8 ?C)     ?TempSrc: Oral     ?SpO2: 100%     ?Weight:  81.8 kg    ?Height:      ? ? ?Intake/Output Summary (Last 24 hours) at 09/05/2021 1430 ?Last data filed at 09/05/2021 1409 ?Gross per 24 hour  ?Intake 300 ml  ?Output 700 ml  ?Net -400 ml  ? ? ?Filed Weights  ? 09/03/21 1758 09/04/21 0500 09/05/21 0500  ?Weight: 86.3 kg 80.7 kg 81.8 kg  ? ? ?Examination: ? ?General exam: Appears calm and comfortable ?HEENT: Patient is PERRLA.  No jaundice. ?Neck: Supple.  No raised JVD. ?Respiratory system: Clear to auscultation. Respiratory effort normal. ?Cardiovascular system: S1 & S2, irregularly irregular.   ?Gastrointestinal system: Abdomen is nondistended, soft and nontender. No organomegaly or masses felt. Normal bowel sounds heard. ?Central nervous system: Alert and oriented.  Patient moves all extremities.   ?Extremities: Swelling of the left hip area.  No lower leg edema. ? ?Data Reviewed: I have personally reviewed following labs and imaging studies ? ?CBC: ?Recent Labs  ?Lab 09/03/21 ?1804 09/04/21 ?0403  ?WBC 12.9* 9.4  ?NEUTROABS 9.2* 6.6  ?HGB 11.4* 10.6*  ?HCT 34.3* 31.8*  ?MCV 90.3 91.4  ?PLT 538* 461*  ? ? ?Basic Metabolic Panel: ?Recent Labs  ?Lab 09/03/21 ?1804 09/03/21 ?1927 09/04/21 ?0403  ?NA 136  --  134*  ?  K 4.0  --  4.0  ?CL 101  --  98  ?CO2 23  --  27  ?GLUCOSE 103*  --  115*  ?BUN 15  --  16  ?CREATININE 1.19  --  0.97  ?CALCIUM 9.3  --  9.0  ?MG  --  2.4 2.1  ? ? ?GFR: ?Estimated Creatinine Clearance: 99.2 mL/min (by C-G formula based on SCr of 0.97 mg/dL). ?Liver Function Tests: ?Recent Labs  ?Lab 09/04/21 ?0403  ?AST 29  ?ALT 14  ?ALKPHOS 68  ?BILITOT 1.0  ?PROT 7.2  ?ALBUMIN 3.8  ? ? ?No results for input(s): LIPASE, AMYLASE in the last 168 hours. ?No  results for input(s): AMMONIA in the last 168 hours. ?Coagulation Profile: ?Recent Labs  ?Lab 09/04/21 ?0403  ?INR 1.2  ? ? ?Cardiac Enzymes: ?Recent Labs  ?Lab 09/04/21 ?0403  ?CKTOTAL 615*  ? ? ?BNP (last 3 results) ?No results for input(s): PROBNP in the last 8760 hours. ?HbA1C: ?No results for input(s): HGBA1C in the last 72 hours. ?CBG: ?No results for input(s): GLUCAP in the last 168 hours. ?Lipid Profile: ?No results for input(s): CHOL, HDL, LDLCALC, TRIG, CHOLHDL, LDLDIRECT in the last 72 hours. ?Thyroid Function Tests: ?Recent Labs  ?  09/04/21 ?0403  ?TSH 5.133*  ? ? ?Anemia Panel: ?No results for input(s): VITAMINB12, FOLATE, FERRITIN, TIBC, IRON, RETICCTPCT in the last 72 hours. ?Urine analysis: ?   ?Component Value Date/Time  ? [place] YELLOW 09/04/2021 0046  ? APPEARANCEUR CLEAR 09/04/2021 0046  ? LABSPEC 1.010 09/04/2021 0046  ? PHURINE 6.0 09/04/2021 0046  ? GLUCOSEU NEGATIVE 09/04/2021 0046  ? [place] NEGATIVE 09/04/2021 0046  ? [place] NEGATIVE 09/04/2021 0046  ? KETONESUR 20 (A) 09/04/2021 0046  ? [place] NEGATIVE 09/04/2021 0046  ? NITRITE NEGATIVE 09/04/2021 0046  ? LEUKOCYTESUR NEGATIVE 09/04/2021 0046  ? ?Sepsis Labs: ?@[email or web address] (procalcitonin:4,lacticidven:4) ? ?)No results found for this or any previous visit (from the past 240 hour(s)).  ? ? ? ? ? ?Radiology Studies: ?DG Chest Port 1 View ? ?Result Date: 09/04/2021 ?CLINICAL DATA:  Hip fracture EXAM: PORTABLE CHEST 1 VIEW COMPARISON:  09/01/2015 FINDINGS: The heart size and mediastinal contours are within normal limits. Both lungs are clear. The visualized skeletal structures are unremarkable. IMPRESSION: No active disease. Electronically Signed   By: Donavan Foil M.D.   On: 09/04/2021 00:47  ? ?DG Hip Unilat W or Wo Pelvis 2-3 Views Left ? ?Result Date: 09/03/2021 ?CLINICAL DATA:  [place] yesterday with left hip pain. EXAM: DG HIP (WITH OR WITHOUT PELVIS) 2-3V LEFT COMPARISON:  None. FINDINGS: There is an acute fracture of the proximal  femur along the prosthetic femoral stem. IMPRESSION: Acute fracture of the proximal femur along the prosthetic femoral stem. Electronically Signed   By: Nelson Chimes M.D.   On: 09/03/2021 18:34  ? ?DG Femur Min 2 Views Left ? ?Result Date: 09/03/2021 ?CLINICAL DATA:  [place] yesterday with pain. EXAM: LEFT FEMUR 2 VIEWS COMPARISON:  None. FINDINGS: Acute fracture along the prosthetic femoral stem. No fracture distal to that. No pelvic bone fracture. IMPRESSION: Acute fracture along the prosthetic femoral stem. No abnormality distal to that. Electronically Signed   By: Nelson Chimes M.D.   On: 09/03/2021 18:35   ? ? ? ? ? ?Scheduled Meds: ? [MAR Hold] diltiazem  240 mg Oral Daily  ? [MAR Hold] levothyroxine  100 mcg Oral Q0600  ? [MAR Hold] metoprolol succinate  50 mg Oral Daily  ? ?Continuous Infusions: ? lactated ringers 75  mL/hr at 09/05/21 1144  ? ? ? LOS: 2 days  ? ? ?Time spent: 25 minutes. ? ? ? ?Dana Allan, MD  ?[facility] ?Pager #: [phone] ?7PM-7AM contact night coverage as above ? ? ?  ?

## 2021-09-05 NOTE — Interval H&P Note (Signed)
History and Physical Interval Note: ? ?09/05/2021 ?12:40 PM ? ?Walter Riggs, MD  has presented today for surgery, with the diagnosis of Left Periprosthetic Fracture.  The various methods of treatment have been discussed with the patient and family. After consideration of risks, benefits and other options for treatment, the patient has consented to  Procedure(s) with comments: ?OPEN REDUCTION INTERNAL FIXATION (ORIF) LEFT PERIPROSTHETIC FRACTURE, POSSIBLE FEMUR REVISION (Left) - ZIMMER CABLES AND DEPUY as a surgical intervention.  The patient's history has been reviewed, patient examined, no change in status, stable for surgery.  I have reviewed the patient's chart and labs.  Questions were answered to the patient's satisfaction.   ? ? ?Walter [name] ? ? ?

## 2021-09-05 NOTE — Anesthesia Postprocedure Evaluation (Signed)
Anesthesia Post Note ? ?Patient: Walter Riggs, MD ? ?Procedure(s) Performed: OPEN REDUCTION INTERNAL FIXATION (ORIF) LEFT PERIPROSTHETIC FRACTURE, POSSIBLE FEMUR REVISION (Left: Hip) ? ?  ? ?Patient location during evaluation: PACU ?Anesthesia Type: Spinal ?Level of consciousness: oriented and awake and alert ?Pain management: pain level controlled ?Vital Signs Assessment: post-procedure vital signs reviewed and stable ?Respiratory status: spontaneous breathing, respiratory function stable and nonlabored ventilation ?Cardiovascular status: blood pressure returned to baseline and stable ?Postop Assessment: no headache, no backache, no apparent nausea or vomiting and spinal receding ?Anesthetic complications: no ? ? ?No notable events documented. ? ?Last Vitals:  ?Vitals:  ? 09/05/21 1715 09/05/21 1730  ?BP: 93/64 98/66  ?Pulse:  62  ?Resp: 12 11  ?Temp:    ?SpO2: 99% 99%  ?  ?Last Pain:  ?Vitals:  ? 09/05/21 1715  ?TempSrc:   ?PainSc: 0-No pain  ? ? ?  ?  ?  ?  ?  ?  ? ?Electra Paladino A. ? ? ? ? ?

## 2021-09-05 NOTE — Progress Notes (Signed)
Received report from PACU nurse, Awaiting patient arrival back into room 1407. ?

## 2021-09-05 NOTE — Progress Notes (Signed)
Unfortunately, Walter [name] slipped and fell Friday evening on a hardwood floor and fell on his left side with immediate left hip pain and inability to get up. He presented to the ED on Saturday and was noted to have a peri-prosthetic femur fracture. He is neurovascularly intact. Plan is to perform ORIF and do stem revision. Discussed in detail and he elects to proceed. Will do surgery this afternoon ?

## 2021-09-05 NOTE — Transfer of Care (Signed)
Immediate Anesthesia Transfer of Care Note ? ?Patient: Walter Riggs, MD ? ?Procedure(s) Performed: Procedure(s) with comments: ?OPEN REDUCTION INTERNAL FIXATION (ORIF) LEFT PERIPROSTHETIC FRACTURE, POSSIBLE FEMUR REVISION (Left) - ZIMMER CABLES AND DEPUY ? ?Patient Location: PACU ? ?Anesthesia Type:Spinal ? ?Level of Consciousness: awake, alert  and oriented ? ?Airway & Oxygen Therapy: Patient Spontanous Breathing ? ?Post-op Assessment: Report given to RN and Post -op Vital signs reviewed and stable ? ?Post vital signs: Reviewed and stable ? ?Last Vitals:  ?Vitals:  ? 09/05/21 1115 09/05/21 1122  ?BP: 118/83   ?Pulse:  89  ?Resp:    ?Temp:    ?SpO2:    ? ? ?Complications: No apparent anesthesia complications ? ?

## 2021-09-05 NOTE — Anesthesia Procedure Notes (Signed)
Spinal ? ?Patient location during procedure: OR ?Start time: 09/05/2021 1:55 PM ?End time: 09/05/2021 2:01 PM ?Reason for block: surgical anesthesia ?Staffing ?Performed: anesthesiologist  ?Anesthesiologist: Mal Amabile, MD ?Preanesthetic Checklist ?Completed: patient identified, IV checked, site marked, risks and benefits discussed, surgical consent, monitors and equipment checked, pre-op evaluation and timeout performed ?Spinal Block ?Patient position: left lateral decubitus ?Prep: DuraPrep and site prepped and draped ?Patient monitoring: heart rate, cardiac monitor, continuous pulse ox and blood pressure ?Approach: midline ?Location: L4-5 ?Injection technique: single-shot ?Needle ?Needle type: Pencan  ?Needle gauge: 24 G ?Needle length: 9 cm ?Needle insertion depth: 7 cm ?Assessment ?Sensory level: T4 ?Events: CSF return ?Additional Notes ?Patient tolerated procedure well. Adequate sensory level. ?Technically difficult due to poor positioning. Attempt x 4. ? ? ? ?

## 2021-09-05 NOTE — Brief Op Note (Signed)
09/05/2021 ? ?4:30 PM ? 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 ?PRE-OPERATIVE DIAGNOSIS:  Left Periprosthetic Fracture Femur ? ?POST-OPERATIVE DIAGNOSIS:  Left Periprosthetic Fracture Femur ? ?PROCEDURE:  Procedure(s) with comments: ?OPEN REDUCTION INTERNAL FIXATION (ORIF) LEFT PERIPROSTHETIC FRACTURE, POSSIBLE FEMUR REVISION (Left) - ZIMMER CABLES AND DEPUY ? ?SURGEON:  Surgeon(s) and Role: ?   Gaynelle Arabian, MD - Primary ? ?PHYSICIAN ASSISTANT:  ? ?ASSISTANTS: Theresa Duty, PA-C  ? ?ANESTHESIA:   spinal ? ?EBL:  500 mL  ? ?BLOOD ADMINISTERED:none ? ?DRAINS: none  ? ?LOCAL MEDICATIONS USED:  NONE ? ?COUNTS:  YES ? ?TOURNIQUET:  * No tourniquets in log * ? ?DICTATION: .Other Dictation: Dictation Number [id number] ? ?PLAN OF CARE: Admit to inpatient  ? ?PATIENT DISPOSITION:  PACU - hemodynamically stable. ?  ? ? ?

## 2021-09-05 NOTE — Anesthesia Preprocedure Evaluation (Addendum)
Anesthesia Evaluation  ?Patient identified by MRN, date of birth, ID band ?Patient awake ? ? ? ?Reviewed: ?Allergy & Precautions, H&P , NPO status , Patient's Chart, lab work & pertinent test results, reviewed documented beta blocker date and time  ? ?Airway ?Mallampati: II ? ?TM Distance: >3 FB ?Neck ROM: Full ? ? ? Dental ?no notable dental hx. ?(+) Teeth Intact, Dental Advisory Given ?  ?Pulmonary ?neg pulmonary ROS,  ?  ?Pulmonary exam normal ?breath sounds clear to auscultation ? ? ? ? ? ? Cardiovascular ?hypertension, Pt. on medications and Pt. on home beta blockers ?+ dysrhythmias Atrial Fibrillation  ?Rhythm:Irregular Rate:Normal ? ?EKG 09/03/21 ?Atrial Fibrillation with RVR ?  ?Neuro/Psych ?negative neurological ROS ? negative psych ROS  ? GI/Hepatic ?negative GI ROS, Neg liver ROS,   ?Endo/Other  ?Hypothyroidism  ? Renal/GU ?Renal diseasenegative Renal ROS  ?negative genitourinary ?  ?Musculoskeletal ? ?(+) Arthritis , Osteoarthritis,   ? Abdominal ?  ?Peds ? Hematology ?negative hematology ROS ?(+)   ?Anesthesia Other Findings ? ? Reproductive/Obstetrics ?negative OB ROS ? ?  ? ? ? ? ? ? ? ? ? ? ? ? ? ?  ?  ? ? ? ? ? ? ? ?Anesthesia Physical ? ?Anesthesia Plan ? ?ASA: 3 ? ?Anesthesia Plan: Spinal  ? ?Post-op Pain Management: Ofirmev IV (intra-op)*  ? ?Induction: Intravenous ? ?PONV Risk Score and Plan: 2 and Midazolam, Dexamethasone, Ondansetron and Propofol infusion ? ?Airway Management Planned: Natural Airway and Simple Face Mask ? ?Additional Equipment:  ? ?Intra-op Plan:  ? ?Post-operative Plan:  ? ?Informed Consent: I have reviewed the patients History and Physical, chart, labs and discussed the procedure including the risks, benefits and alternatives for the proposed anesthesia with the patient or authorized representative who has indicated his/her understanding and acceptance.  ? ? ? ?Dental advisory given ? ?Plan Discussed with: CRNA and  Anesthesiologist ? ?Anesthesia Plan Comments: (See PAT note 08/03/2021, Jodell Cipro Ward, PA-C)  ? ? ? ? ? ? ?Anesthesia Quick Evaluation ? ?

## 2021-09-05 NOTE — Discharge Instructions (Addendum)
? ?Dr. Homero Fellers Aluisio ?Total Joint Specialist ?[facility] ?[address] ?[place], [place] [address] ?[phone] ? ?POSTOPERATIVE DIRECTIONS ? ?PRECAUTIONS (6 WEEKS FOLLOWING SURGERY) ?Do not bend your hip past a 90 degree angle ?Do not cross your legs. ?Don?t twist your hip inwards- keep knees and toes pointed upwards ? ? ?HOME CARE INSTRUCTIONS  ?Remove items at home which could result in a fall. This includes throw rugs or furniture in walking pathways.  ?ICE to the affected hip every three hours for 30 minutes at a time and then as needed for pain and swelling.  Continue to use ice on the hip for pain and swelling from surgery. You may notice swelling that will progress down to the foot and ankle.  This is normal after surgery.  Elevate the leg when you are not up walking on it.   ?Continue to use the breathing machine which will help keep your temperature down.  It is common for your temperature to cycle up and down following surgery, especially at night when you are not up moving around and exerting yourself.  The breathing machine keeps your lungs expanded and your temperature down. ? ?DIET ?You may resume your previous home diet once your are discharged from the hospital. ? ?DRESSING / WOUND CARE / SHOWERING ?You may change your dressing 3-5 days after surgery.  Then change the dressing every day with sterile gauze.  Please use good hand washing techniques before changing the dressing.  Do not use any lotions or creams on the incision until instructed by your surgeon. ?You may start showering once you are discharged home but do not submerge the incision under water. Just pat the incision dry and apply a dry gauze dressing on daily. ?Change the surgical dressing daily and reapply a dry dressing each time. ? ?ACTIVITY ?Walk with your walker as instructed. ?Use walker as long as suggested by your caregivers. ?Avoid periods of inactivity such as sitting longer than an hour when not asleep. This  helps prevent blood clots.  ?You may resume a sexual relationship in one month or when given the OK by your doctor.  ?You may return to work once you are cleared by your doctor.  ?Do not drive a car for 6 weeks or until released by you surgeon.  ?Do not drive while taking narcotics. ? ?WEIGHT BEARING ?Weight bearing as tolerated with assist device (walker, cane, etc) as directed, use it as long as suggested by your surgeon or therapist, typically at least 4-6 weeks. ? ?POSTOPERATIVE CONSTIPATION PROTOCOL ?Constipation - defined medically as fewer than three stools per week and severe constipation as less than one stool per week. ? ?One of the most common issues patients have following surgery is constipation.  Even if you have a regular bowel pattern at home, your normal regimen is likely to be disrupted due to multiple reasons following surgery.  Combination of anesthesia, postoperative narcotics, change in appetite and fluid intake all can affect your bowels.  In order to avoid complications following surgery, here are some recommendations in order to help you during your recovery period. ? ?Colace (docusate) - Pick up an over-the-counter form of Colace or another stool softener and take twice a day as long as you are requiring postoperative pain medications.  Take with a full glass of water daily.  If you experience loose stools or diarrhea, hold the colace until you stool forms back up.  If your symptoms do not get better within 1 week  or if they get worse, check with your doctor. ? ?Dulcolax (bisacodyl) - Pick up over-the-counter and take as directed by the product packaging as needed to assist with the movement of your bowels.  Take with a full glass of water.  Use this product as needed if not relieved by Colace only.  ? ?MiraLax (polyethylene glycol) - Pick up over-the-counter to have on hand.  MiraLax is a solution that will increase the amount of water in your bowels to assist with bowel movements.  Take  as directed and can mix with a glass of water, juice, soda, coffee, or tea.  Take if you go more than two days without a movement. ?Do not use MiraLax more than once per day. Call your doctor if you are still constipated or irregular after using this medication for 7 days in a row. ? ?If you continue to have problems with postoperative constipation, please contact the office for further assistance and recommendations.  If you experience "the worst abdominal pain ever" or develop nausea or vomiting, please contact the office immediatly for further recommendations for treatment. ? ?ITCHING ? If you experience itching with your medications, try taking only a single pain pill, or even half a pain pill at a time.  You can also use Benadryl over the counter for itching or also to help with sleep.  ? ?TED HOSE STOCKINGS ?Wear the elastic stockings on both legs for three weeks following surgery during the day but you may remove then at night for sleeping. ? ?MEDICATIONS ?See your medication summary on the ?After Visit Summary? that the nursing staff will review with you prior to discharge.  You may have some home medications which will be placed on hold until you complete the course of blood thinner medication.  It is important for you to complete the blood thinner medication as prescribed by your surgeon.  Continue your approved medications as instructed at time of discharge. ? ?PRECAUTIONS ?If you experience chest pain or shortness of breath - call 911 immediately for transfer to the hospital emergency department.  ?If you develop a fever greater that 101 F, purulent drainage from wound, increased redness or drainage from wound, foul odor from the wound/dressing, or calf pain - CONTACT YOUR SURGEON.   ?                                                ?FOLLOW-UP APPOINTMENTS ?Make sure you keep all of your appointments after your operation with your surgeon and caregivers. You should call the office at the above phone  number and make an appointment for approximately two weeks after the date of your surgery or on the date instructed by your surgeon outlined in the "After Visit Summary". ? ?RANGE OF MOTION AND STRENGTHENING EXERCISES  ?These exercises are designed to help you keep full movement of your hip joint. Follow your caregiver's or physical therapist's instructions. Perform all exercises about fifteen times, three times per day or as directed. Exercise both hips, even if you have had only one joint replacement. These exercises can be done on a training (exercise) mat, on the floor, on a table or on a bed. Use whatever works the best and is most comfortable for you. Use music or television while you are exercising so that the exercises are a pleasant break in your day. This will  make your life better with the exercises acting as a break in routine you can look forward to.  ?Lying on your back, slowly slide your foot toward your buttocks, raising your knee up off the floor. Then slowly slide your foot back down until your leg is straight again.  ?Lying on your back spread your legs as far apart as you can without causing discomfort.  ?Lying on your side, raise your upper leg and foot straight up from the floor as far as is comfortable. Slowly lower the leg and repeat.  ?Lying on your back, tighten up the muscle in the front of your thigh (quadriceps muscles). You can do this by keeping your leg straight and trying to raise your heel off the floor. This helps strengthen the largest muscle supporting your knee.  ?Lying on your back, tighten up the muscles of your buttocks both with the legs straight and with the knee bent at a comfortable angle while keeping your heel on the floor.  ? ?IF YOU ARE TRANSFERRED TO A SKILLED REHAB FACILITY ?If the patient is transferred to a skilled rehab facility following release from the hospital, a list of the current medications will be sent to the facility for the patient to continue.  When  discharged from the skilled rehab facility, please have the facility set up the patient's Home Health Physical Therapy prior to being released. Also, the skilled facility will be responsible for providing th

## 2021-09-05 NOTE — H&P (View-Only) (Signed)
Unfortunately, Walter [name] slipped and fell Friday evening on a hardwood floor and fell on his left side with immediate left hip pain and inability to get up. He presented to the ED on Saturday and was noted to have a peri-prosthetic femur fracture. He is neurovascularly intact. Plan is to perform ORIF and do stem revision. Discussed in detail and he elects to proceed. Will do surgery this afternoon ?

## 2021-09-06 ENCOUNTER — Encounter (HOSPITAL_COMMUNITY): Payer: Self-pay | Admitting: Orthopedic Surgery

## 2021-09-06 LAB — CBC
HCT: 26.2 % — ABNORMAL LOW (ref 39.0–52.0)
Hemoglobin: 8.6 g/dL — ABNORMAL LOW (ref 13.0–17.0)
MCH: 30.2 pg (ref 26.0–34.0)
MCHC: 32.8 g/dL (ref 30.0–36.0)
MCV: 91.9 fL (ref 80.0–100.0)
Platelets: 401 10*3/uL — ABNORMAL HIGH (ref 150–400)
RBC: 2.85 MIL/uL — ABNORMAL LOW (ref 4.22–5.81)
RDW: 12.4 % (ref 11.5–15.5)
WBC: 8.7 10*3/uL (ref 4.0–10.5)
nRBC: 0 % (ref 0.0–0.2)

## 2021-09-06 LAB — BASIC METABOLIC PANEL
Anion gap: 11 (ref 5–15)
BUN: 12 mg/dL (ref 6–20)
CO2: 24 mmol/L (ref 22–32)
Calcium: 8.8 mg/dL — ABNORMAL LOW (ref 8.9–10.3)
Chloride: 98 mmol/L (ref 98–111)
Creatinine, Ser: 0.78 mg/dL (ref 0.61–1.24)
GFR, Estimated: >60 mL/min (ref >60)
Glucose, Bld: 131 mg/dL — ABNORMAL HIGH (ref 70–99)
Potassium: 4.3 mmol/L (ref 3.5–5.1)
Sodium: 133 mmol/L — ABNORMAL LOW (ref 135–145)

## 2021-09-06 LAB — PROTIME-INR
INR: 1.4 — ABNORMAL HIGH (ref 0.8–1.2)
Prothrombin Time: 17.1 seconds — ABNORMAL HIGH (ref 11.4–15.2)

## 2021-09-06 MED ORDER — RIVAROXABAN 20 MG PO TABS
20.0000 mg | ORAL_TABLET | Freq: Every day | ORAL | Status: DC
  Administered 2021-09-07: 20 mg via ORAL
  Filled 2021-09-06: qty 1

## 2021-09-06 MED ORDER — MELATONIN 5 MG PO TABS
5.0000 mg | ORAL_TABLET | Freq: Every day | ORAL | Status: DC
  Administered 2021-09-06: 5 mg via ORAL
  Filled 2021-09-06: qty 1

## 2021-09-06 MED ORDER — RIVAROXABAN 10 MG PO TABS
10.0000 mg | ORAL_TABLET | Freq: Once | ORAL | Status: AC
  Administered 2021-09-06: 10 mg via ORAL
  Filled 2021-09-06: qty 1

## 2021-09-06 MED ORDER — LEVOTHYROXINE SODIUM 112 MCG PO TABS
112.0000 ug | ORAL_TABLET | Freq: Every day | ORAL | Status: DC
  Administered 2021-09-07: 112 ug via ORAL
  Filled 2021-09-06: qty 1

## 2021-09-06 MED ORDER — SODIUM CHLORIDE 0.9 % IV SOLN: INTRAVENOUS | Status: DC

## 2021-09-06 NOTE — Progress Notes (Signed)
? ?  Subjective: ?1 Day Post-Op Procedure(s) (LRB): ?OPEN REDUCTION INTERNAL FIXATION (ORIF) LEFT PERIPROSTHETIC FRACTURE, POSSIBLE FEMUR REVISION (Left) ?Patient seen in rounds by Dr. Wynelle Link. ?Patient reports pain as mild.  ?Patient is well, and has had no acute complaints or problems. Foley cath removed this AM. Denies SOB or chest pain. ?We will start with therapy today.  ? ?Objective: ?Vital signs in last 24 hours: ?Temp:  [97.8 ?F (36.6 ?C)-98.5 ?F (36.9 ?C)] 98 ?F (36.7 ?C) (04/11 0515) ?Pulse Rate:  [60-100] 90 (04/11 0515) ?Resp:  [11-18] 18 (04/11 0515) ?BP: (85-146)/(54-107) 139/89 (04/11 0515) ?SpO2:  [97 %-100 %] 100 % (04/11 0515) ? ?Intake/Output from previous day: ? ?Intake/Output Summary (Last 24 hours) at 09/06/2021 0736 ?Last data filed at 09/06/2021 0716 ?Gross per 24 hour  ?Intake 1770 ml  ?Output 2075 ml  ?Net -305 ml  ?  ? ?Intake/Output this shift: ?Total I/O ?In: -  ?Out: 200 [Urine:200] ? ?Labs: ?Recent Labs  ?  09/03/21 ?1804 09/04/21 ?0403 09/06/21 ?0422  ?HGB 11.4* 10.6* 8.6*  ? ?Recent Labs  ?  09/04/21 ?0403 09/06/21 ?0422  ?WBC 9.4 8.7  ?RBC 3.48* 2.85*  ?HCT 31.8* 26.2*  ?PLT 461* 401*  ? ?Recent Labs  ?  09/04/21 ?0403 09/06/21 ?0422  ?NA 134* 133*  ?K 4.0 4.3  ?CL 98 98  ?CO2 27 24  ?BUN 16 12  ?CREATININE 0.97 0.78  ?GLUCOSE 115* 131*  ?CALCIUM 9.0 8.8*  ? ?Recent Labs  ?  09/04/21 ?0403  ?INR 1.2  ? ? ?Exam: ?General - Patient is Alert and Oriented ?Extremity - Neurologically intact ?Neurovascular intact ?Sensation intact distally ?Dorsiflexion/Plantar flexion intact ?Dressing - dressing C/D/I ?Motor Function - intact, moving foot and toes well on exam. ? ?Past Medical History:  ?Diagnosis Date  ? Arthritis   ? Cardiomyopathy ([place])   ? Dysrhythmia   ? Hypothyroidism   ? Persistent atrial fibrillation ([place])   ? Typical atrial flutter ([place])   ? ? ?Assessment/Plan: ?1 Day Post-Op Procedure(s) (LRB): ?OPEN REDUCTION INTERNAL FIXATION (ORIF) LEFT PERIPROSTHETIC FRACTURE, POSSIBLE FEMUR  REVISION (Left) ?Principal Problem: ?  Closed left hip fracture ([place]) ?Active Problems: ?  Atrial fibrillation with RVR ([place]) ?  Hypothyroidism ?  Paroxysmal atrial fibrillation with RVR ([place]) ?  AKI (acute kidney injury) ([place]) ?  Leukocytosis ? ?Estimated body mass index is 25.15 kg/m? as calculated from the following: ?  Height as of this encounter: 5\' 11"  (1.803 m). ?  Weight as of this encounter: 81.8 kg. ?Advance diet ?Up with therapy ?D/C IV fluids ? ?DVT Prophylaxis - Xarelto ?50% partial weight bearing as tolerated ?Begin therapy ?Hip precautions discussed with patient ? ?Plan is to go Home after hospital stay. He will start with physical therapy today. Remains on 50% partial weight bearing restrictions with posterior hip precautions. ? ?R. Jaynie Bream, PA-C ?Orthopedic Surgery ?09/06/2021, 7:36 AM  ?

## 2021-09-06 NOTE — Progress Notes (Signed)
Occupational Therapy Evaluation ? ?Patient with recent hip surgery and unfortunately fell sustaining periprosthetic fracture. Had progressed to using cane occasionally post op. Patient educated on hip precautions and weight bearing status however did not initiate education on long handle A/E this session. Patient needing min +2 for safety to power up to standing and ambulate few feet. Patient's HR up to 165-170 therefore transferred to recliner RN aware. Patient highly motivated and wanted to keep going however advised needing to get HR better stabilized. Acute OT to follow, anticipate patient will progress quickly. ? ? ? 09/06/21 1200  ?OT Visit Information  ?Last OT Received On 09/06/21  ?Assistance Needed +2 ?(safety/chair follow, afib)  ?History of Present Illness 46 YO male admitted after fall, sustained L periprosthetic femur fracture  on 09/03/21. S/P OPEN REDUCTION INTERNAL FIXATION (ORIF) LEFT PERIPROSTHETIC FRACTURE with femoral stem revision and cables. PMH: significant for OA, hypothyroidism, A-flutter, A-fib, cardiomyopathy, s/p cardioversion in 2019.  ?Precautions  ?Precautions Fall;Posterior Hip  ?Precaution Comments afib, HR high  ?Required Braces or Orthoses Knee Immobilizer - Left  ?Restrictions  ?Weight Bearing Restrictions Yes  ?LLE Weight Bearing PWB  ?LLE Partial Weight Bearing Percentage or Pounds 50  ?Home Living  ?Family/patient expects to be discharged to: Private residence  ?Living Arrangements Non-relatives/Friends  ?Available Help at Discharge Family;Available 24 hours/day  ?Type of Home House  ?Home Access Stairs to enter  ?Entrance Stairs-Number of Steps 2  ?Entrance Stairs-Rails None  ?Home Layout Two level;Able to live on main level with bedroom/bathroom;Bed/bath upstairs  ?Alternate Level Stairs-Number of Steps 15  ?Alternate Level Stairs-Rails Left  ?Bathroom Shower/Tub Walk-in shower  ?Bathroom Toilet Standard  ?Bathroom Accessibility Yes  ?Home Equipment Shower seat - built  Medical sales representative (2 wheels);Cane - single point  ?Prior Function  ?Prior Level of Function  Independent/Modified Independent  ?Mobility Comments patient had used cane intermittently post THA  ?Communication  ?Communication No difficulties  ?Pain Assessment  ?Pain Assessment Faces  ?Faces Pain Scale 4  ?Pain Location L hip  ?Pain Descriptors / Indicators Operative site guarding;Discomfort;Tightness;Cramping;Sore  ?Pain Intervention(s) Monitored during session;Premedicated before session  ?Cognition  ?Arousal/Alertness Awake/alert  ?Behavior During Therapy [facility] for tasks assessed/performed  ?Overall Cognitive Status Within Functional Limits for tasks assessed  ?Upper Extremity Assessment  ?Upper Extremity Assessment Overall WFL for tasks assessed  ?Lower Extremity Assessment  ?Lower Extremity Assessment Defer to PT evaluation  ?Cervical / Trunk Assessment  ?Cervical / Trunk Assessment Normal  ?ADL  ?Overall ADL's  Needs assistance/impaired  ?Eating/Feeding Independent  ?Grooming Set up;Sitting  ?Upper Body Bathing Set up;Sitting  ?Lower Body Bathing Maximal assistance;Sit to/from stand;Sitting/lateral leans  ?Lower Body Bathing Details (indicate cue type and reason) 2* hip precautions  ?Upper Body Dressing  Set up;Sitting  ?Lower Body Dressing Maximal assistance;Sit to/from stand;Sitting/lateral leans  ?Lower Body Dressing Details (indicate cue type and reason) 2* hip precautions  ?Toilet Transfer Minimal assistance;+2 for safety/equipment;Cueing for safety;Cueing for sequencing;Rolling walker (2 wheels)  ?Toilet Transfer Details (indicate cue type and reason) Bed height elevated, min A +2 for safety to power up to standing. Patient able to ambulate with rolling walker to doorway however HR up to 165-170 therefore had to transfer to recliner.  ?Toileting- Clothing Manipulation and Hygiene Moderate assistance;Sit to/from stand;Sitting/lateral lean  ?Functional mobility during ADLs Minimal assistance;+2 for  safety/equipment;Cueing for safety;Rolling walker (2 wheels)  ?General ADL Comments Patient needing increased assistance for self care tasks due to pain and hip precautions  ?Bed Mobility  ?Overal bed mobility  Needs Assistance  ?Bed Mobility Supine to Sit  ?Supine to sit Min assist  ?General bed mobility comments Verbal cues for posterior hip precautions, assist to move L LE to edge of bed  ?Balance  ?Overall balance assessment Needs assistance  ?Sitting-balance support Feet supported;No upper extremity supported  ?Sitting balance-Leahy Scale Good  ?Standing balance support Bilateral upper extremity supported;During functional activity;Reliant on assistive device for balance  ?Standing balance-Leahy Scale Poor  ?OT - End of Session  ?Equipment Utilized During Treatment Gait belt;Rolling walker (2 wheels)  ?Activity Tolerance Patient tolerated treatment well;Treatment limited secondary to medical complications (Comment) ?(Afib)  ?Patient left in chair;with call bell/phone within reach;with chair alarm set  ?Nurse Communication Other (comment) ?(RN present during session)  ?OT Assessment  ?OT Recommendation/Assessment Patient needs continued OT Services  ?OT Visit Diagnosis Other abnormalities of gait and mobility (R26.89);History of falling (Z91.81)  ?OT Problem List Decreased activity tolerance;Impaired balance (sitting and/or standing);Decreased safety awareness;Decreased knowledge of use of DME or AE;Pain  ?OT Plan  ?OT Frequency (ACUTE ONLY) Min 2X/week  ?OT Treatment/Interventions (ACUTE ONLY) Self-care/ADL training;DME and/or AE instruction;Therapeutic activities;Patient/family education;Balance training  ?AM-PAC OT "6 Clicks" Daily Activity Outcome Measure (Version 2)  ?Help from another person eating meals? 4  ?Help from another person taking care of personal grooming? 3  ?Help from another person toileting, which includes using toliet, bedpan, or urinal? 2  ?Help from another person bathing (including  washing, rinsing, drying)? 2  ?Help from another person to put on and taking off regular upper body clothing? 3  ?Help from another person to put on and taking off regular lower body clothing? 2  ?6 Click Score 16  ?Progressive Mobility  ?What is the highest level of mobility based on the progressive mobility assessment? Level 5 (Walks with assist in room/hall) - Balance while stepping forward/back and can walk in room with assist - Complete  ?Activity Ambulated with assistance in room  ?OT Recommendation  ?Follow Up Recommendations Follow physician's recommendations for discharge plan and follow up therapies  ?Assistance recommended at discharge Frequent or constant Supervision/Assistance  ?Patient can return home with the following A little help with walking and/or transfers;A lot of help with bathing/dressing/bathroom;Help with stairs or ramp for entrance;Assistance with cooking/housework  ?Functional Status Assessent Patient has had a recent decline in their functional status and demonstrates the ability to make significant improvements in function in a reasonable and predictable amount of time.  ?OT Equipment Other (comment);Toilet riser ?(hip kit)  ?Individuals Consulted  ?Consulted and Agree with Results and Recommendations Patient  ?Acute Rehab OT Goals  ?Patient Stated Goal To get out of bed and move  ?OT Goal Formulation With patient  ?Time For Goal Achievement 09/20/21  ?Potential to Achieve Goals Good  ?OT Time Calculation  ?OT Start Time (ACUTE ONLY) [phone]  ?OT Stop Time (ACUTE ONLY) 0954  ?OT Time Calculation (min) 15 min  ?OT General Charges  ?$OT Visit 1 Visit  ?OT Evaluation  ?$OT Eval Low Complexity 1 Low  ?Written Expression  ?Dominant Hand Left  ? ?Delbert Phenix OT ?OT pager: [phone] ? ?

## 2021-09-06 NOTE — Progress Notes (Addendum)
Pt dangled on edge of bed beginning of shift and tolerated well. At [place] pt requested foley removal as it was "technically POD #1" and it was causing discomfort. RN removed foley, pt tolerated well.  ? ?Pt able to void on own at 0600 ?

## 2021-09-06 NOTE — Evaluation (Signed)
Physical Therapy Evaluation ?Patient Details ?Name: Walter BASTONE, MD ?MRN: [id number] ?DOB: 05-06-1976 ?Today's Date: 09/06/2021 ? ?History of Present Illness ? 46 YO male admitted after fall, sustained L periprosthetic femur fracture  on 09/03/21. S/P OPEN REDUCTION INTERNAL FIXATION (ORIF) LEFT PERIPROSTHETIC FRACTURE with femoral stem revision and cables on 09/05/21.Marland Kitchen PMH: significant for OA, hypothyroidism, A-flutter, A-fib, cardiomyopathy, s/p cardioversion in 2019.  ?Clinical Impression ? The  patient is eager to get OOB, HR range from 107at rest, up to  171 when up and ambulating x 20' only. RN aware.  HR back to 122 after  resting in recliner. ?Patient now is 50% WBS and posterior hip precautions post  surgery on L LE.  Patient should progress well. ? RN to let PT know if patient can ambulate in PM, depending on HR control.   ?Pt admitted with above diagnosis.  ? Pt currently with functional limitations due to the deficits listed below (see PT Problem List). Pt will benefit from skilled PT to increase their independence and safety with mobility to allow discharge to the venue listed below.   ? ?   ? ?Recommendations for follow up therapy are one component of a multi-disciplinary discharge planning process, led by the attending physician.  Recommendations may be updated based on patient status, additional functional criteria and insurance authorization. ? ?Follow Up Recommendations Follow physician's recommendations for discharge plan and follow up therapies ? ?  ?Assistance Recommended at Discharge Intermittent Supervision/Assistance  ?Patient can return home with the following ? A little help with walking and/or transfers;A little help with bathing/dressing/bathroom;Assistance with cooking/housework;Help with stairs or ramp for entrance;Assist for transportation ? ?  ?Equipment Recommendations None recommended by PT  ?Recommendations for Other Services ?    ?  ?Functional Status Assessment Patient has had a  recent decline in their functional status and demonstrates the ability to make significant improvements in function in a reasonable and predictable amount of time.  ? ?  ?Precautions / Restrictions Precautions ?Precautions: Fall;Posterior Hip ?Precaution Comments: afib, HR high ?Required Braces or Orthoses: Knee Immobilizer - Left ?Knee Immobilizer - Left:  (no orders specify use, was placed  on post op. patient took it off. , will get  clarification) ?Restrictions ?Weight Bearing Restrictions: Yes ?LLE Weight Bearing: Partial weight bearing ?LLE Partial Weight Bearing Percentage or Pounds: 50  ? ?  ? ?Mobility ? Bed Mobility ?  ?Bed Mobility: Supine to Sit ?  ?  ?Supine to sit: Min assist ?  ?  ?General bed mobility comments: cues for posterior hip precaitions, assistance for LLE to move to bed edge ?  ? ?Transfers ?Overall transfer level: Needs assistance ?Equipment used: Rolling walker (2 wheels) ?Transfers: Sit to/from Stand ?Sit to Stand: Min assist, +2 safety/equipment, From elevated surface ?  ?  ?  ?  ?  ?General transfer comment: cues for hand and left leg position, PWB status, posteriotr precautions ?  ? ?Ambulation/Gait ?Ambulation/Gait assistance: Min assist, +2 safety/equipment ?Gait Distance (Feet): 20 Feet ?Assistive device: Rolling walker (2 wheels) ?Gait Pattern/deviations: Step-to pattern ?Gait velocity: decreased ?  ?  ?General Gait Details: Slow  progression, Cues for PWB on  LLE ? ?Stairs ?  ?  ?  ?  ?  ? ?Wheelchair Mobility ?  ? ?Modified Rankin (Stroke Patients Only) ?  ? ?  ? ?Balance Overall balance assessment: Needs assistance ?Sitting-balance support: Feet supported, No upper extremity supported ?Sitting balance-Leahy Scale: Good ?  ?  ?Standing balance support: Bilateral upper  extremity supported, During functional activity, Reliant on assistive device for balance ?Standing balance-Leahy Scale: Fair ?  ?  ?  ?  ?  ?  ?  ?  ?  ?  ?  ?  ?   ? ? ? ?Pertinent Vitals/Pain Pain  Assessment ?Pain Location: L hip ?Pain Descriptors / Indicators: Operative site guarding, Discomfort, Tightness, Cramping, Sore ?Pain Intervention(s): Monitored during session, Premedicated before session, Ice applied  ? ? ?Home Living Family/patient expects to be discharged to:: Private residence ?Living Arrangements: Non-relatives/Friends ?Available Help at Discharge: Family;Available 24 hours/day ?Type of Home: House ?Home Access: Stairs to enter ?Entrance Stairs-Rails: None ?Entrance Stairs-Number of Steps: 2 ?  ?Home Layout: Two level;Able to live on main level with bedroom/bathroom;Bed/bath upstairs ?Home Equipment: Shower seat - built Medical sales representative (2 wheels);Cane - single point ?   ?  ?Prior Function Prior Level of Function : Independent/Modified Independent ?  ?  ?  ?  ?  ?  ?Mobility Comments: had  goe to cANE INTERMITTENT POST  THA. ?  ?  ? ? ?Hand Dominance  ? Dominant Hand: Left ? ?  ?Extremity/Trunk Assessment  ?   ?  ? ?Lower Extremity Assessment ?LLE Deficits / Details: required support to move leg across bed, ?  ? ?Cervical / Trunk Assessment ?Cervical / Trunk Assessment: Normal  ?Communication  ? Communication: No difficulties  ?Cognition Arousal/Alertness: Awake/alert ?Behavior During Therapy: [facility] for tasks assessed/performed ?Overall Cognitive Status: Within Functional Limits for tasks assessed ?  ?  ?  ?  ?  ?  ?  ?  ?  ?  ?  ?  ?  ?  ?  ?  ?  ?  ?  ? ?  ?General Comments   ? ?  ?Exercises    ? ?Assessment/Plan  ?  ?PT Assessment Patient needs continued PT services  ?PT Problem List Decreased strength;Decreased range of motion;Decreased balance;Decreased mobility;Decreased activity tolerance;Decreased knowledge of use of DME;Decreased knowledge of precautions;Pain ? ?   ?  ?PT Treatment Interventions DME instruction;Gait training;Stair training;Functional mobility training;Therapeutic activities;Therapeutic exercise;Balance training;Patient/family education   ? ?PT Goals (Current goals can  be found in the Care Plan section)  ?Acute Rehab PT Goals ?Patient Stated Goal: to  wal, OOB ?PT Goal Formulation: With patient ?Time For Goal Achievement: 09/13/21 ?Potential to Achieve Goals: Good ? ?  ?Frequency Min 6X/week ?  ? ? ?Co-evaluation   ?  ?  ?  ?  ? ? ?  ?AM-PAC PT "6 Clicks" Mobility  ?Outcome Measure Help needed turning from your back to your side while in a flat bed without using bedrails?: A Little ?Help needed moving from lying on your back to sitting on the side of a flat bed without using bedrails?: A Little ?Help needed moving to and from a bed to a chair (including a wheelchair)?: A Little ?  ?Help needed to walk in hospital room?: A Little ?Help needed climbing 3-5 steps with a railing? : A Lot ?6 Click Score: 14 ? ?  ?End of Session Equipment Utilized During Treatment: Gait belt ?Activity Tolerance: Patient tolerated treatment well;Treatment limited secondary to medical complications (Comment) (HR up to 170,) ?Patient left: in chair;with call bell/phone within reach;with nursing/sitter in room ?Nurse Communication: Mobility status ?PT Visit Diagnosis: Muscle weakness (generalized) (M62.81);Difficulty in walking, not elsewhere classified (R26.2) ?  ? ?Time: [id number] ?PT Time Calculation (min) (ACUTE ONLY): 17 min ? ? ?Charges:   PT Evaluation ?$PT Eval Low Complexity:  1 Low ?  ?  ?   ? ? ?Tresa Endo PT ?Acute Rehabilitation Services ?Pager [phone] ?Office [phone] ? ? ?Johnpaul Gillentine, Shella Maxim ?09/06/2021, 10:14 AM ? ?

## 2021-09-06 NOTE — Consult Note (Addendum)
?Cardiology Consultation:  ? ?Patient ID: Walter B Bry, Walter [name] ?MRN: [id number]; DOB: 03/27/1976 ? ?Admit date: 09/03/2021 ?Date of Consult: 09/06/2021 ? ?PCP:  Murray, Laura Woodruff, FNP ?  ?[facility] Providers ?Cardiologist:  Mark Skains, Walter [name]  ?Electrophysiologist:  CAMERON T LAMBERT, Walter [name]   ? ? ?Patient Profile:  ? ?Walter B Neidhardt, Walter [name] is a 46 y.o. male with a hx of permanent atrial fibrillation,  typical atrial flutter, nonischemic cardiomyopathy, hypothyroidism who is being seen 09/06/2021 for the evaluation of atrial fibrillation at the request of Dr. Anwar. ? ?History of Present Illness:  ? ?Walter [name] is a 46 year old male with above medical history who is followed by Dr. Skains. Per chart review, patient had his first episode of atrial fibrillation in 08/2015 while at a wedding in [place]. Converted back to sinus rhythm spontaneously. Was again found to have atrial fibrillation with HR ranging between 110-120. Underwent cardioversion on 10/01/2017 and was started on flecainide. Had an exercise tolerance test on 11/05/17 that showed no evidence of ischemia, no evidence of QRS widening on flecanide, no adverse arrhythmias. Had a follow up appointment on 04/08/2019 and was found to be in typical atrial flutter with a HR of 92 BPM. Echocardiogram on 04/18/2019 showed EF 30-35%.  ? ?Patient was referred to EP and was seen on 04/18/2019, planned to undergo afib ablation. As part of the workup prior to ablation and for his cardiomyopathy, patient was suppose to have a coronary CT to exclude CAD. However, patient did not have these tests/procedures done.  ? ?Patient was seen by Cardiology on 06/15/21 for clearance prior to total hip arthoplasty. At that visit, patient reported 2-3 episodes of afib daily. Was started on xarelto, referred to EP for consideration of ablation.  ? ?Was most recently seen by Cardiology-EP on 08/02/21. At that time, patient reported palpitations/flutters when he was out of rhythm. Decided to undergo afib  ablation, scheduled for 11/12/21. Also was scheduled for CT coronary to rule out CAD. Most recent echocardiogram completed on 08/12/21 and showed EF 45-50% (may be underestimated in the setting of atrial fibrillation).  ? ?Patient reported to the ED on 09/03/21 complaining of left hip pain after a ground level fall, s/p hip surgery on 08/15/21. Patient was found to have an acute, left periprosthetic fracture and was noted to be in atrial fibrillation with HR in the 160s. He was started on IV diltiazem with improvement. Underwent ORIF and stem revision on 4/10. While walking with physical therapy AM of 4/11, patient was noted to have a HR of 107 at rest, 171 while walking, and 122 after resting. Cardiology was consulted  ? ?On interview, patient reports that he is struggled with A-fib on and off for years.  Was scheduled for an ablation a few years ago, however did not go through with it.  Reports that he had a cardioversion in 2019 that was successful, however only lasted for a few months before he went back into atrial fibrillation.  Reports that he has had difficulty controlling his heart rate on diltiazem, metoprolol.  Had hip replacement on 08/15/2021.  Had a fall at home on 09/03/2021 that caused him to present to the emergency department.  Reports that he had not taken any of his rate controlling medications earlier that day and was found to have an elevated heart rate.  Heart rate was controlled on IV diltiazem.  However after surgery to repair broken hip on 4/10, patient was again found to be in atrial   fibrillation with RVR.  Especially tachycardic when walking with physical therapy, heart rate into the 170s.  Denies any shortness of breath, chest pain, dizziness, syncope/near syncope.  Does have some palpitations/fluttering in his chest. ? ? ?Past Medical History:  ?Diagnosis Date  ? Arthritis   ? Cardiomyopathy ([place])   ? Dysrhythmia   ? Hypothyroidism   ? Persistent atrial fibrillation ([place])   ? Typical atrial  flutter ([place])   ? ? ?Past Surgical History:  ?Procedure Laterality Date  ? CARDIOVERSION N/A 10/01/2017  ? Procedure: CARDIOVERSION;  Surgeon: Lelon Perla, Walter [name];  Location: [facility] ENDOSCOPY;  Service: Cardiovascular;  Laterality: N/A;  ? NO PAST SURGERIES    ? ORIF FEMUR FRACTURE Left 09/05/2021  ? Procedure: OPEN REDUCTION INTERNAL FIXATION (ORIF) LEFT PERIPROSTHETIC FRACTURE, POSSIBLE FEMUR REVISION;  Surgeon: Gaynelle Arabian, Walter [name];  Location: [facility] ORS;  Service: Orthopedics;  Laterality: Left;  [place]  ? TOTAL HIP ARTHROPLASTY Left 08/15/2021  ? Procedure: TOTAL HIP ARTHROPLASTY ANTERIOR APPROACH;  Surgeon: Gaynelle Arabian, Walter [name];  Location: [facility] ORS;  Service: Orthopedics;  Laterality: Left;  ?  ? ?Home Medications:  ?Prior to Admission medications   ?Medication Sig Start Date End Date Taking? Authorizing Provider  ?diltiazem (TIAZAC) 240 MG 24 hr capsule TAKE 1 CAPSULE BY MOUTH DAILY ?Patient taking differently: Take 240 mg by mouth daily. 07/04/21  Yes Jerline Pain, Walter [name]  ?HYDROcodone-acetaminophen (NORCO/VICODIN) 5-325 MG tablet Take 1-2 tablets by mouth every 6 (six) hours as needed for severe pain. 08/16/21  Yes Fenton Foy D, PA-C  ?levothyroxine (SYNTHROID) 100 MCG tablet Take 100 mcg by mouth daily before breakfast. 01/14/20  Yes Provider, Historical, Walter [name]  ?methocarbamol (ROBAXIN) 500 MG tablet Take 1 tablet (500 mg total) by mouth every 6 (six) hours as needed for muscle spasms. 08/16/21  Yes Fenton Foy D, PA-C  ?metoprolol succinate (TOPROL-XL) 50 MG 24 hr tablet TAKE 1 TABLET(50 MG) BY MOUTH DAILY WITH OR IMMEDIATELY FOLLOWING A MEAL ?Patient taking differently: Take 50 mg by mouth daily. 06/20/21  Yes Jerline Pain, Walter [name]  ?Polyethyl Glycol-Propyl Glycol (SYSTANE OP) Place 4-5 drops into both eyes 2 (two) times daily.    Yes Provider, Historical, Walter [name]  ?rivaroxaban (XARELTO) 20 MG TABS tablet Take 1 tablet (20 mg total) by mouth daily with supper. ?Patient taking differently: Take 20 mg by mouth  daily. 06/15/21  Yes Imogene Burn, PA-C  ?telmisartan (MICARDIS) 20 MG tablet TAKE 1 TABLET BY MOUTH DAILY ?Patient taking differently: Take 20 mg by mouth daily. 07/27/21  Yes Jerline Pain, Walter [name]  ?traMADol (ULTRAM) 50 MG tablet Take 1-2 tablets (50-100 mg total) by mouth every 6 (six) hours as needed for moderate pain. ?Patient not taking: Reported on 09/03/2021 08/16/21   Jonnie Kind, PA-C  ? ? ?Inpatient Medications: ?Scheduled Meds: ? Chlorhexidine Gluconate Cloth  6 each Topical Daily  ? diltiazem  240 mg Oral Daily  ? docusate sodium  100 mg Oral BID  ? levothyroxine  100 mcg Oral Q0600  ? metoprolol succinate  50 mg Oral Daily  ? rivaroxaban  10 mg Oral Q breakfast  ? ?Continuous Infusions: ? sodium chloride 75 mL/hr at 09/06/21 0758  ? methocarbamol (ROBAXIN) IV    ? ?PRN Meds: ?acetaminophen, bisacodyl, diphenhydrAMINE, HYDROcodone-acetaminophen, HYDROcodone-acetaminophen, menthol-cetylpyridinium **OR** phenol, methocarbamol **OR** methocarbamol (ROBAXIN) IV, metoCLOPramide **OR** metoCLOPramide (REGLAN) injection, morphine injection, naLOXone (NARCAN)  injection, ondansetron **OR** ondansetron (ZOFRAN) IV, polyethylene glycol, sodium phosphate, traMADol ? ?Allergies:   No Known Allergies ? ?  Social History:   ?Social History  ? ?Socioeconomic History  ? Marital status: Single  ?  Spouse name: Not on file  ? Number of children: Not on file  ? Years of education: Not on file  ? Highest education level: Not on file  ?Occupational History  ? Not on file  ?Tobacco Use  ? Smoking status: Never  ? Smokeless tobacco: Never  ?Vaping Use  ? Vaping Use: Never used  ?Substance and Sexual Activity  ? Alcohol use: Not Currently  ?  Comment: occ  ? Drug use: No  ? Sexual activity: Not on file  ?Other Topics Concern  ? Not on file  ?Social History Narrative  ? Lives in [place] alone.  Divorced  ? No children  ?   ? Pathologist, working at [employer]  ?   ? ?Social Determinants of Health   ? ?Financial Resource Strain: Not on file  ?Food Insecurity: Not on file  ?Transportation Needs: Not on file  ?Physical Activity: Not on file  ?Stress: Not on file  ?Social Connections: Not on file  ?Intimate Par

## 2021-09-06 NOTE — Progress Notes (Addendum)
?PROGRESS NOTE ? ? ? ?Seward Grater, MD  BY:[id number] DOB: Jun 28, 1975 DOA: 09/03/2021 ?PCP: Marrian Salvage, [place]  ?Outpatient Specialists:  ? ? ? ?Brief Narrative:  ?Patient is a 46 year old male with past medical history significant for paroxysmal atrial fibrillation on Xarelto, hypothyroidism secondary to Hashimoto's thyroiditis and osteoarthritis of the left hip status post left hip arthroplasty on 08/15/2021.  Patient was admitted following a fall at home with subsequent left hip periprosthetic fracture.   ? ? ?Assessment & Plan: ?  ?Status post ORIF, left periprosthetic femur fracture with femoral stem revision, POD 1 ?-Ortho following.  PT/OT.  Plan is for outpatient PT.  Pain scale in place.  Foley catheter removed ? ?Rapid A-fib: ?-Patient was initially on Cardizem drip. ?-Went back into rapid A-fib working with PT today.  Heart rates in the 170s to 180s.  We will consult cardiology. ?-Continue home Cardizem CD and Toprol-XL. ?-Patient's home Xarelto has been resumed after surgery ? ?Mild acute kidney injury: ?-Resolved. ? ?Hypothyroidism: ?-History of Hashimoto's thyroiditis. ?-Increase Synthroid to 112 mcg daily ?-TSH was 5.133, repeat TSH in 6 weeks outpatient ? ?DVT prophylaxis: Xarelto ?Code Status: Full code. ?Family Communication: Significant other at bedside ?Disposition Plan: Home with outpatient PT ? ? ?Consultants:  ?Orthopedic surgery, cardiology ? ?Procedures:  ?ORIF, left periprosthetic femur with femoral stem revision ? ? ?Subjective: ?Seen and examined. Sitting in recliner. Started working with PT and went into rapid afib with HR's in 170s to 180s. Felt palpitations.  ? ?Objective: ?Vitals:  ? 09/06/21 0804 09/06/21 1232 09/06/21 1300 09/06/21 1534  ?BP: (!) 140/96 (!) 152/100 117/83 136/88  ?Pulse: 99 (!) 115 (!) 118 89  ?Resp: 12 15 16 18   ?Temp: 98.2 ?F (36.8 ?C) 98.5 ?F (36.9 ?C) 98.6 ?F (37 ?C) 98.3 ?F (36.8 ?C)  ?TempSrc: Oral Oral Oral Oral  ?SpO2: 99% 100% 100% 100%  ?Weight:       ?Height:      ? ? ?Intake/Output Summary (Last 24 hours) at 09/06/2021 1707 ?Last data filed at 09/06/2021 1600 ?Gross per 24 hour  ?Intake 920 ml  ?Output 1935 ml  ?Net -1015 ml  ? ? ?Filed Weights  ? 09/03/21 1758 09/04/21 0500 09/05/21 0500  ?Weight: 86.3 kg 80.7 kg 81.8 kg  ? ? ?Examination: ? ?General exam: Appears calm and comfortable ?HEENT: Patient is PERRLA.  No jaundice. ?Neck: Supple.  No raised JVD. ?Respiratory system: Clear to auscultation. Respiratory effort normal. ?Cardiovascular system: S1 & S2, irregularly irregular.   ?Gastrointestinal system: Abdomen is nondistended, soft and nontender. No organomegaly or masses felt. Normal bowel sounds heard. ?Central nervous system: Alert and oriented.  Patient moves all extremities.   ?Extremities: Dressing clean dry and intact  No lower leg edema. ? ?Data Reviewed: I have personally reviewed following labs and imaging studies ? ?CBC: ?Recent Labs  ?Lab 09/03/21 ?1804 09/04/21 ?0403 09/06/21 ?0422  ?WBC 12.9* 9.4 8.7  ?NEUTROABS 9.2* 6.6  --   ?HGB 11.4* 10.6* 8.6*  ?HCT 34.3* 31.8* 26.2*  ?MCV 90.3 91.4 91.9  ?PLT 538* 461* 401*  ? ? ?Basic Metabolic Panel: ?Recent Labs  ?Lab 09/03/21 ?1804 09/03/21 ?1927 09/04/21 ?0403 09/06/21 ?0422  ?NA 136  --  134* 133*  ?K 4.0  --  4.0 4.3  ?CL 101  --  98 98  ?CO2 23  --  27 24  ?GLUCOSE 103*  --  115* 131*  ?BUN 15  --  16 12  ?CREATININE 1.19  --  0.97  0.78  ?CALCIUM 9.3  --  9.0 8.8*  ?MG  --  2.4 2.1  --   ? ? ?GFR: ?Estimated Creatinine Clearance: 120.3 mL/min (by C-G formula based on SCr of 0.78 mg/dL). ?Liver Function Tests: ?Recent Labs  ?Lab 09/04/21 ?0403  ?AST 29  ?ALT 14  ?ALKPHOS 68  ?BILITOT 1.0  ?PROT 7.2  ?ALBUMIN 3.8  ? ? ?No results for input(s): LIPASE, AMYLASE in the last 168 hours. ?No results for input(s): AMMONIA in the last 168 hours. ?Coagulation Profile: ?Recent Labs  ?Lab 09/04/21 ?0403  ?INR 1.2  ? ? ?Cardiac Enzymes: ?Recent Labs  ?Lab 09/04/21 ?0403  ?CKTOTAL 615*  ? ? ?BNP (last 3  results) ?No results for input(s): PROBNP in the last 8760 hours. ?HbA1C: ?No results for input(s): HGBA1C in the last 72 hours. ?CBG: ?No results for input(s): GLUCAP in the last 168 hours. ?Lipid Profile: ?No results for input(s): CHOL, HDL, LDLCALC, TRIG, CHOLHDL, LDLDIRECT in the last 72 hours. ?Thyroid Function Tests: ?Recent Labs  ?  09/04/21 ?0403  ?TSH 5.133*  ? ? ?Anemia Panel: ?No results for input(s): VITAMINB12, FOLATE, FERRITIN, TIBC, IRON, RETICCTPCT in the last 72 hours. ?Urine analysis: ?   ?Component Value Date/Time  ? [place] YELLOW 09/04/2021 0046  ? APPEARANCEUR CLEAR 09/04/2021 0046  ? LABSPEC 1.010 09/04/2021 0046  ? PHURINE 6.0 09/04/2021 0046  ? GLUCOSEU NEGATIVE 09/04/2021 0046  ? [place] NEGATIVE 09/04/2021 0046  ? [place] NEGATIVE 09/04/2021 0046  ? KETONESUR 20 (A) 09/04/2021 0046  ? [place] NEGATIVE 09/04/2021 0046  ? NITRITE NEGATIVE 09/04/2021 0046  ? LEUKOCYTESUR NEGATIVE 09/04/2021 0046  ? ?Sepsis Labs: ?@LABRCNTIP (procalcitonin:4,lacticidven:4) ? ?)No results found for this or any previous visit (from the past 240 hour(s)).  ? ? ? ? ? ?Radiology Studies: ?DG Pelvis Portable ? ?Result Date: 09/05/2021 ?CLINICAL DATA:  Status post left hip replacement. EXAM: PORTABLE PELVIS 1-2 VIEWS COMPARISON:  August 15, 2021. FINDINGS: The left acetabular and femoral components are well situated. Expected postoperative changes are noted in the surrounding soft tissues. IMPRESSION: The status post left total hip arthroplasty. Electronically Signed   By: Marijo Conception M.D.   On: 09/05/2021 17:00   ? ? ? ? ? ?Scheduled Meds: ? Chlorhexidine Gluconate Cloth  6 each Topical Daily  ? diltiazem  240 mg Oral Daily  ? docusate sodium  100 mg Oral BID  ? [START ON 09/07/2021] levothyroxine  112 mcg Oral Q0600  ? metoprolol succinate  50 mg Oral Daily  ? [START ON 09/07/2021] rivaroxaban  20 mg Oral Q breakfast  ? ?Continuous Infusions: ? methocarbamol (ROBAXIN) IV    ? ? ? LOS: 3 days  ? ? ?Time spent:  35 minutes. ? ?Author: ?Leslee Home, DO ? ?  ?

## 2021-09-06 NOTE — Op Note (Signed)
NAME: Meharg, Foch B. ?MEDICAL RECORD NO: [id number] ?ACCOUNT NO: [id number] ?DATE OF BIRTH: [birth year] ?FACILITY: [facility] ?LOCATION: [facility] ?PHYSICIAN: Gus Rankin. Kiven Vangilder, MD ? ?Operative Report  ? ?DATE OF PROCEDURE: 09/05/2021 ? ?PREOPERATIVE DIAGNOSIS:  Left periprosthetic femur fracture. ? ?POSTOPERATIVE DIAGNOSIS:  Left periprosthetic femur fracture. ? ?PROCEDURE:  Open reduction and internal fixation, left periprosthetic femur fracture with femoral stem revision. ? ?SURGEON:  Gus Rankin. Jarren Para, MD ? ?ASSISTANT:  Arther Abbott, PA-C ? ?ANESTHESIA:  Spinal. ? ?ESTIMATED BLOOD LOSS:  500 mL. ? ?DRAINS:  None. ? ?COMPLICATIONS:  None. ? ?CONDITION:  Stable to recovery. ? ?BRIEF CLINICAL NOTE: Sharia Reeve is a 46 year old male who had an uncomplicated left total hip arthroplasty performed approximately 3 weeks ago.  He was doing excellent and then slipped on a wet floor on the evening of 09/02/2021.  He had immediate left hip  ?pain and was unable to bear weight.  He presented to the Emergency Department the next day and was noted to have a periprosthetic femur fracture.  Medical team treated him for atrial fibrillation with RVR.  They felt he was stable for surgery.  He  ?presents today for open reduction and internal fixation as well as femoral revision of the left femur. ? ?DESCRIPTION OF PROCEDURE:  After successful administration of spinal anesthetic, the patient was placed in the right lateral decubitus position with the left side up and held with a hip positioner.  Left lower extremity was isolated from his perineum  ?with plastic drapes and prepped and draped in the usual sterile fashion.  A long posterolateral incision was made with a 10 blade through subcutaneous tissue to the fascia lata, which was incised in line with the skin incision.  Sciatic nerve was  ?palpated and protected.  Short rotators were isolated off the femur and capsulotomy was performed.  I was able to identify the prosthesis at this time.  I then  incised the fascia lata more distally to a point where it would be distal to the  ?fracture.  The fascia of the vastus lateralis was incised and the muscle was elevated off the posterior intermuscular septum to get to the lateral cortex of the femur.  He had an oblique long fracture of the femur.  With traction and manipulation, I was  ?able to reduce this and hold it with fracture reduction clamps.  This was a near anatomic reduction.  I then passed 3 Zimmer cables and we tightened these sequentially and when they were tied enough, then I was able to crimp the cables and cut them.   ?This led to a very stable reduction and essentially, it was anatomic in nature.  We then addressed the femoral component. ? ?The femoral component had subsided and there was damage to the calcar where it would not support any kind of a calcar bearing prosthesis.  I removed the femoral head and then removed the femoral component. I was initially going to do a S-ROM prosthesis,  ?but again, the calcar would not have supported the sleeve well enough, so I decided against that.  I decided to do a fully coated stem that would go beyond the fracture and decided on a 6-inch AML.  I did the axial reaming up to 17.5 mm for an 18 mm  ?stem.  We broached  to the 18 short stature stem and I placed a trial. With the trials, the broaches are in approximately 20 degrees of anteversion, matching his native version.  We trialed with the  neck and then a 36, +1.5 head.  Hip was reduced with  ?outstanding stability.  Full extension, full external rotation, 70 degrees flexion, 40 degrees adduction and 90 degrees internal rotation and 90 degrees of flexion, 70 degrees of internal rotation.  By placing the left leg on top of the right, I felt leg ? lengths were equal.  Hip was dislocated and then the trials were removed.  The permanent 6-inch Solution stem short stature with an 18 mm diameter was impacted into the femur in about 20 degrees of anteversion  with fantastic fit.  I placed a 36, +1.5  ?ceramic head and reduced the hip with the same stability parameters.  Wound was copiously irrigated with saline solution.  I placed a fourth cable just below the lesser trochanter to provide more support there to prevent any kind of subsidence.  This was ? tightened and then crimped and cut.  Further irrigation was performed.  We closed the fascia over the vastus lateralis with a running #1 Vicryl.  Reattached the short external rotators and capsule to the femur with #1 Ethibond suture through drill  ?holes.  I closed the fascia lata with a running 0 Stratafix suture.  Subcutaneous was closed with interrupted 2-0 Vicryl and subcuticular running 4-0 Monocryl.  The incision was cleaned and dried and Steri-Strips and sterile dressing applied.  He was  ?placed into a knee immobilizer, awakened, and transported to recovery in stable condition. ? ?Note that a surgical assistant was of medical necessity for this procedure.  Assistance was necessary for retraction of vital neurovascular structures and to apply traction and help assist with reducing this very complex fracture.  And also, necessary  ?for helping with the open reduction and internal fixation as well as proper positioning of the limb for placement of the new prosthesis. ? ? ?MUK ?D: 09/05/2021 4:37:04 pm T: 09/06/2021 12:07:00 am  ?JOB: [id number]/ [id number]  ?

## 2021-09-06 NOTE — H&P (View-Only) (Signed)
?Cardiology Consultation:  ? ?Patient ID: Walter Riggs, MD ?MRN: [id number]; DOB: 11-21-1975 ? ?Admit date: 09/03/2021 ?Date of Consult: 09/06/2021 ? ?PCP:  Olive Bass, FNP ?  ?[facility] Providers ?Cardiologist:  Donato Schultz, MD  ?Electrophysiologist:  Lanier Prude, MD   ? ? ?Patient Profile:  ? 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 Linna Darner. ? ?History of Present Illness:  ? ?Walter [name] is a 46 year old male with above medical history who is followed by Dr. Anne Fu. Per chart review, patient had his first episode of atrial fibrillation in 08/2015 while at a wedding in [place]. Converted back to sinus rhythm spontaneously. Was again found to have atrial fibrillation with HR ranging between 110-120. Underwent cardioversion on 10/01/2017 and was started on flecainide. Had an exercise tolerance test on 11/05/17 that showed no evidence of ischemia, no evidence of QRS widening on flecanide, no adverse arrhythmias. Had a follow up appointment on 04/08/2019 and was found to be in typical atrial flutter with a HR of 92 BPM. Echocardiogram on 04/18/2019 showed EF 30-35%.  ? ?Patient was referred to EP and was seen on 04/18/2019, planned to undergo afib ablation. As part of the workup prior to ablation and for his cardiomyopathy, patient was suppose to have a coronary CT to exclude CAD. However, patient did not have these tests/procedures done.  ? ?Patient was seen by Cardiology on 06/15/21 for clearance prior to total hip arthoplasty. At that visit, patient reported 2-3 episodes of afib daily. Was started on xarelto, referred to EP for consideration of ablation.  ? ?Was most recently seen by Cardiology-EP on 08/02/21. At that time, patient reported palpitations/flutters when he was out of rhythm. Decided to undergo afib  ablation, scheduled for 11/12/21. Also was scheduled for CT coronary to rule out CAD. Most recent echocardiogram completed on 08/12/21 and showed EF 45-50% (may be underestimated in the setting of atrial fibrillation).  ? ?Patient reported to the ED on 09/03/21 complaining of left hip pain after a ground level fall, s/p hip surgery on 08/15/21. Patient was found to have an acute, left periprosthetic fracture and was noted to be in atrial fibrillation with HR in the 160s. He was started on IV diltiazem with improvement. Underwent ORIF and stem revision on 4/10. While walking with physical therapy AM of 4/11, patient was noted to have a HR of 107 at rest, 171 while walking, and 122 after resting. Cardiology was consulted  ? ?On interview, patient reports that he is struggled with A-fib on and off for years.  Was scheduled for an ablation a few years ago, however did not go through with it.  Reports that he had a cardioversion in 2019 that was successful, however only lasted for a few months before he went back into atrial fibrillation.  Reports that he has had difficulty controlling his heart rate on diltiazem, metoprolol.  Had hip replacement on 08/15/2021.  Had a fall at home on 09/03/2021 that caused him to present to the emergency department.  Reports that he had not taken any of his rate controlling medications earlier that day and was found to have an elevated heart rate.  Heart rate was controlled on IV diltiazem.  However after surgery to repair broken hip on 4/10, patient was again found to be in atrial  fibrillation with RVR.  Especially tachycardic when walking with physical therapy, heart rate into the 170s.  Denies any shortness of breath, chest pain, dizziness, syncope/near syncope.  Does have some palpitations/fluttering in his chest. ? ? ?Past Medical History:  ?Diagnosis Date  ? Arthritis   ? Cardiomyopathy ([place])   ? Dysrhythmia   ? Hypothyroidism   ? Persistent atrial fibrillation ([place])   ? Typical atrial  flutter ([place])   ? ? ?Past Surgical History:  ?Procedure Laterality Date  ? CARDIOVERSION N/A 10/01/2017  ? Procedure: CARDIOVERSION;  Surgeon: Lelon Perla, MD;  Location: [facility] ENDOSCOPY;  Service: Cardiovascular;  Laterality: N/A;  ? NO PAST SURGERIES    ? ORIF FEMUR FRACTURE Left 09/05/2021  ? Procedure: OPEN REDUCTION INTERNAL FIXATION (ORIF) LEFT PERIPROSTHETIC FRACTURE, POSSIBLE FEMUR REVISION;  Surgeon: Gaynelle Arabian, MD;  Location: [facility] ORS;  Service: Orthopedics;  Laterality: Left;  [place]  ? TOTAL HIP ARTHROPLASTY Left 08/15/2021  ? Procedure: TOTAL HIP ARTHROPLASTY ANTERIOR APPROACH;  Surgeon: Gaynelle Arabian, MD;  Location: [facility] ORS;  Service: Orthopedics;  Laterality: Left;  ?  ? ?Home Medications:  ?Prior to Admission medications   ?Medication Sig Start Date End Date Taking? Authorizing Provider  ?diltiazem (TIAZAC) 240 MG 24 hr capsule TAKE 1 CAPSULE BY MOUTH DAILY ?Patient taking differently: Take 240 mg by mouth daily. 07/04/21  Yes Jerline Pain, MD  ?HYDROcodone-acetaminophen (NORCO/VICODIN) 5-325 MG tablet Take 1-2 tablets by mouth every 6 (six) hours as needed for severe pain. 08/16/21  Yes Fenton Foy D, PA-C  ?levothyroxine (SYNTHROID) 100 MCG tablet Take 100 mcg by mouth daily before breakfast. 01/14/20  Yes [provider]  ?methocarbamol (ROBAXIN) 500 MG tablet Take 1 tablet (500 mg total) by mouth every 6 (six) hours as needed for muscle spasms. 08/16/21  Yes Fenton Foy D, PA-C  ?metoprolol succinate (TOPROL-XL) 50 MG 24 hr tablet TAKE 1 TABLET(50 MG) BY MOUTH DAILY WITH OR IMMEDIATELY FOLLOWING A MEAL ?Patient taking differently: Take 50 mg by mouth daily. 06/20/21  Yes Jerline Pain, MD  ?Polyethyl Glycol-Propyl Glycol (SYSTANE OP) Place 4-5 drops into both eyes 2 (two) times daily.    Yes [provider]  ?rivaroxaban (XARELTO) 20 MG TABS tablet Take 1 tablet (20 mg total) by mouth daily with supper. ?Patient taking differently: Take 20 mg by mouth  daily. 06/15/21  Yes Imogene Burn, PA-C  ?telmisartan (MICARDIS) 20 MG tablet TAKE 1 TABLET BY MOUTH DAILY ?Patient taking differently: Take 20 mg by mouth daily. 07/27/21  Yes Jerline Pain, MD  ?traMADol (ULTRAM) 50 MG tablet Take 1-2 tablets (50-100 mg total) by mouth every 6 (six) hours as needed for moderate pain. ?Patient not taking: Reported on 09/03/2021 08/16/21   Jonnie Kind, PA-C  ? ? ?Inpatient Medications: ?Scheduled Meds: ? Chlorhexidine Gluconate Cloth  6 each Topical Daily  ? diltiazem  240 mg Oral Daily  ? docusate sodium  100 mg Oral BID  ? levothyroxine  100 mcg Oral Q0600  ? metoprolol succinate  50 mg Oral Daily  ? rivaroxaban  10 mg Oral Q breakfast  ? ?Continuous Infusions: ? sodium chloride 75 mL/hr at 09/06/21 0758  ? methocarbamol (ROBAXIN) IV    ? ?PRN Meds: ?acetaminophen, bisacodyl, diphenhydrAMINE, HYDROcodone-acetaminophen, HYDROcodone-acetaminophen, menthol-cetylpyridinium **OR** phenol, methocarbamol **OR** methocarbamol (ROBAXIN) IV, metoCLOPramide **OR** metoCLOPramide (REGLAN) injection, morphine injection, naLOXone (NARCAN)  injection, ondansetron **OR** ondansetron (ZOFRAN) IV, polyethylene glycol, sodium phosphate, traMADol ? ?Allergies:   No Known Allergies ? ?  Social History:   ?Social History  ? ?Socioeconomic History  ? Marital status: Single  ?  Spouse name: Not on file  ? Number of children: Not on file  ? Years of education: Not on file  ? Highest education level: Not on file  ?Occupational History  ? Not on file  ?Tobacco Use  ? Smoking status: Never  ? Smokeless tobacco: Never  ?Vaping Use  ? Vaping Use: Never used  ?Substance and Sexual Activity  ? Alcohol use: Not Currently  ?  Comment: occ  ? Drug use: No  ? Sexual activity: Not on file  ?Other Topics Concern  ? Not on file  ?Social History Narrative  ? Lives in [place] alone.  Divorced  ? No children  ?   ? Pathologist, working at [employer]  ?   ? ?Social Determinants of Health   ? ?Financial Resource Strain: Not on file  ?Food Insecurity: Not on file  ?Transportation Needs: Not on file  ?Physical Activity: Not on file  ?Stress: Not on file  ?Social Connections: Not on file  ?[place]imate [place]

## 2021-09-06 NOTE — Progress Notes (Signed)
Physical Therapy in to work with Patient. Heart Rate elevated with activity on telemetry A Fb rate 171-175. Pt only ambulated x 20' due to heart rate. Pt placed in recliner and other acute changes with Pt's assessment. Maintain current plan of care ?

## 2021-09-07 ENCOUNTER — Encounter (HOSPITAL_COMMUNITY)
Admission: EM | Disposition: A | Discharge status: Home / Self Care | Payer: Self-pay | Source: Home / Self Care | Attending: Internal Medicine

## 2021-09-07 ENCOUNTER — Inpatient Hospital Stay (HOSPITAL_COMMUNITY): Payer: Managed Care, Other (non HMO) | Admitting: Anesthesiology

## 2021-09-07 ENCOUNTER — Other Ambulatory Visit: Payer: Self-pay

## 2021-09-07 ENCOUNTER — Encounter (HOSPITAL_COMMUNITY): Payer: Self-pay | Admitting: Internal Medicine

## 2021-09-07 ENCOUNTER — Inpatient Hospital Stay (HOSPITAL_COMMUNITY)
Admit: 2021-09-07 | Discharge: 2021-09-07 | Disposition: A | Discharge status: Home / Self Care | Payer: Managed Care, Other (non HMO) | Attending: Cardiology | Admitting: Cardiology

## 2021-09-07 DIAGNOSIS — I4891 Unspecified atrial fibrillation: Secondary | ICD-10-CM

## 2021-09-07 DIAGNOSIS — I10 Essential (primary) hypertension: Secondary | ICD-10-CM

## 2021-09-07 DIAGNOSIS — E039 Hypothyroidism, unspecified: Secondary | ICD-10-CM

## 2021-09-07 DIAGNOSIS — I088 Other rheumatic multiple valve diseases: Secondary | ICD-10-CM

## 2021-09-07 HISTORY — PX: CARDIOVERSION: SHX1299

## 2021-09-07 HISTORY — PX: TEE WITHOUT CARDIOVERSION: SHX5443

## 2021-09-07 LAB — BASIC METABOLIC PANEL
Anion gap: 7 (ref 5–15)
BUN: 14 mg/dL (ref 6–20)
CO2: 27 mmol/L (ref 22–32)
Calcium: 8.9 mg/dL (ref 8.9–10.3)
Chloride: 102 mmol/L (ref 98–111)
Creatinine, Ser: 0.83 mg/dL (ref 0.61–1.24)
GFR, Estimated: >60 mL/min (ref >60)
Glucose, Bld: 139 mg/dL — ABNORMAL HIGH (ref 70–99)
Potassium: 4.2 mmol/L (ref 3.5–5.1)
Sodium: 136 mmol/L (ref 135–145)

## 2021-09-07 LAB — CBC
HCT: 24.2 % — ABNORMAL LOW (ref 39.0–52.0)
Hemoglobin: 8.3 g/dL — ABNORMAL LOW (ref 13.0–17.0)
MCH: 31.1 pg (ref 26.0–34.0)
MCHC: 34.3 g/dL (ref 30.0–36.0)
MCV: 90.6 fL (ref 80.0–100.0)
Platelets: 414 10*3/uL — ABNORMAL HIGH (ref 150–400)
RBC: 2.67 MIL/uL — ABNORMAL LOW (ref 4.22–5.81)
RDW: 12.6 % (ref 11.5–15.5)
WBC: 10.8 10*3/uL — ABNORMAL HIGH (ref 4.0–10.5)
nRBC: 0 % (ref 0.0–0.2)

## 2021-09-07 LAB — MAGNESIUM: Magnesium: 2.2 mg/dL (ref 1.7–2.4)

## 2021-09-07 SURGERY — ECHOCARDIOGRAM, TRANSESOPHAGEAL
Anesthesia: Monitor Anesthesia Care

## 2021-09-07 MED ORDER — SODIUM CHLORIDE 0.9 % IV SOLN: INTRAVENOUS | Status: DC | PRN

## 2021-09-07 MED ORDER — AMIODARONE HCL 200 MG PO TABS: 200.0000 mg | ORAL_TABLET | Freq: Every day | ORAL | 0 refills | Status: DC

## 2021-09-07 MED ORDER — AMIODARONE HCL 200 MG PO TABS
200.0000 mg | ORAL_TABLET | Freq: Every day | ORAL | Status: DC
Start: 2021-09-14 — End: 2021-09-07

## 2021-09-07 MED ORDER — LEVOTHYROXINE SODIUM 112 MCG PO TABS: 112.0000 ug | ORAL_TABLET | Freq: Every day | ORAL | 0 refills | Status: DC

## 2021-09-07 MED ORDER — AMIODARONE HCL 400 MG PO TABS: 400.0000 mg | ORAL_TABLET | Freq: Two times a day (BID) | ORAL | 0 refills | Status: DC

## 2021-09-07 MED ORDER — AMIODARONE HCL 200 MG PO TABS: 200.0000 mg | ORAL_TABLET | Freq: Every day | ORAL | Status: DC

## 2021-09-07 MED ORDER — PROPOFOL 10 MG/ML IV BOLUS
INTRAVENOUS | Status: DC | PRN
  Administered 2021-09-07 (×3): 50 mg via INTRAVENOUS

## 2021-09-07 MED ORDER — AMIODARONE HCL 400 MG PO TABS: 400.0000 mg | ORAL_TABLET | Freq: Two times a day (BID) | ORAL | Status: DC

## 2021-09-07 MED ORDER — PROPOFOL 500 MG/50ML IV EMUL
INTRAVENOUS | Status: DC | PRN
  Administered 2021-09-07: 50 ug/kg/min via INTRAVENOUS

## 2021-09-07 MED ORDER — AMIODARONE HCL 200 MG PO TABS: 400.0000 mg | ORAL_TABLET | Freq: Two times a day (BID) | ORAL | Status: DC

## 2021-09-07 MED ORDER — LIDOCAINE 2% (20 MG/ML) 5 ML SYRINGE
INTRAMUSCULAR | Status: DC | PRN
  Administered 2021-09-07 (×2): 50 mg via INTRAVENOUS

## 2021-09-07 NOTE — Interval H&P Note (Signed)
History and Physical Interval Note: ? ?09/07/2021 ?11:29 AM ? ?Walter Riggs, MD  has presented today for surgery, with the diagnosis of AFIB.  The various methods of treatment have been discussed with the patient and family. After consideration of risks, benefits and other options for treatment, the patient has consented to  Procedure(s): ?TRANSESOPHAGEAL ECHOCARDIOGRAM (TEE) (N/A) ?CARDIOVERSION (N/A) as a surgical intervention.  The patient's history has been reviewed, patient examined, no change in status, stable for surgery.  I have reviewed the patient's chart and labs.  Questions were answered to the patient's satisfaction.   ? ? ?Walter [name] ? ? ?

## 2021-09-07 NOTE — Transfer of Care (Signed)
Immediate Anesthesia Transfer of Care Note ? ?Patient: Walter Grater, MD ? ?Procedure(s) Performed: TRANSESOPHAGEAL ECHOCARDIOGRAM (TEE) ?CARDIOVERSION ? ?Patient Location: Endoscopy Unit ? ?Anesthesia Type:MAC ? ?Level of Consciousness: awake and alert  ? ?Airway & Oxygen Therapy: Patient Spontanous Breathing ? ?Post-op Assessment: Report given to RN and Post -op Vital signs reviewed and stable ? ?Post vital signs: Reviewed and stable ? ?Last Vitals:  ?Vitals Value Taken Time  ?BP 104/73 09/07/21 1215  ?Temp    ?Pulse 80 09/07/21 1217  ?Resp 13 09/07/21 1217  ?SpO2 99 % 09/07/21 1217  ?Vitals shown include unvalidated device data. ? ?Last Pain:  ?Vitals:  ? 09/07/21 1050  ?TempSrc: Temporal  ?PainSc: 0-No pain  ?   ? ?Patients Stated Pain Goal: 0 (09/06/21 0905) ? ?Complications: No notable events documented. ?

## 2021-09-07 NOTE — Progress Notes (Signed)
?  Echocardiogram ?Echocardiogram Transesophageal has been performed. ? Walter [name] ?09/07/2021, 12:24 PM ?

## 2021-09-07 NOTE — Procedures (Signed)
? ? ? ?  Transesophageal Echocardiogram Note ? ?[name], MD ?[id number] ?Mar 03, 1976 ? ?Procedure: Transesophageal Echocardiogram ?Indications: Afib ? ?Procedure Details ?Consent: Obtained ?Time Out: Verified patient identification, verified procedure, site/side was marked, verified correct patient position, special equipment/implants available, Radiology Safety Procedures followed,  medications/allergies/relevent history reviewed, required imaging and test results available.  Performed ? ?Medications: ?Propofol: 200mg  per CV anesthesia ? ?Left Ventrical:  LVEF 50% ? ?Mitral Valve: Normal structure, trivial MR ? ?Aortic Valve: Tricuspid, trivial AI ? ?Tricuspid Valve: Normal structure, trivial TR ? ?Pulmonic Valve: Normal structure, trivial PI ? ?Left Atrium/ Left atrial appendage: Mildly dilated; no evidence of LAA thrombus ? ?Atrial septum: No PFO by color flow ? ?Aorta: No significant plaque ? ?DCCV performed following TEE where he received 150J x1 with return to NSR. Please see separate note for details.  ? ? ?Complications: No apparent complications ?Patient did tolerate procedure well. ? ? , MD ?09/07/2021, 12:12 PM  ?

## 2021-09-07 NOTE — Anesthesia Preprocedure Evaluation (Signed)
Anesthesia Evaluation  ?Patient identified by MRN, date of birth, ID band ?Patient awake ? ? ? ?Reviewed: ?Allergy & Precautions, NPO status , Patient's Chart, lab work & pertinent test results, reviewed documented beta blocker date and time  ? ?Airway ?Mallampati: II ? ?TM Distance: >3 FB ?Neck ROM: Full ? ? ? Dental ?no notable dental hx. ? ?  ?Pulmonary ?neg pulmonary ROS,  ?  ?Pulmonary exam normal ?breath sounds clear to auscultation ? ? ? ? ? ? Cardiovascular ?hypertension, Pt. on medications and Pt. on home beta blockers ?Normal cardiovascular exam+ dysrhythmias (eliquis) Atrial Fibrillation  ?Rhythm:Regular Rate:Normal ? ? ?  ?Neuro/Psych ?negative neurological ROS ? negative psych ROS  ? GI/Hepatic ?negative GI ROS, Neg liver ROS,   ?Endo/Other  ?Hypothyroidism  ? Renal/GU ?  ?negative genitourinary ?  ?Musculoskeletal ? ?(+) Arthritis , Osteoarthritis,   ? Abdominal ?  ?Peds ?negative pediatric ROS ?(+)  Hematology ? ?(+) Blood dyscrasia, anemia , Hb 8.3   ?Anesthesia Other Findings ?S/p ORIF for femur fx 09/05/21 and then went into RVR ? Reproductive/Obstetrics ?negative OB ROS ? ?  ? ? ? ? ? ? ? ? ? ? ? ? ? ?  ?  ? ? ? ? ? ? ? ? ?Anesthesia Physical ?Anesthesia Plan ? ?ASA: 3 ? ?Anesthesia Plan: General  ? ?Post-op Pain Management:   ? ?Induction:  ? ?PONV Risk Score and Plan: 2 and Propofol infusion and TIVA ? ?Airway Management Planned: Natural Airway and Simple Face Mask ? ?Additional Equipment: None ? ?Intra-op Plan:  ? ?Post-operative Plan:  ? ?Informed Consent: I have reviewed the patients History and Physical, chart, labs and discussed the procedure including the risks, benefits and alternatives for the proposed anesthesia with the patient or authorized representative who has indicated his/her understanding and acceptance.  ? ? ? ? ? ?Plan Discussed with: CRNA ? ?Anesthesia Plan Comments:   ? ? ? ? ? ? ?Anesthesia Quick Evaluation ? ?

## 2021-09-07 NOTE — Discharge Summary (Signed)
?Physician Discharge Summary ?  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 ?PCP: Olive Bass, FNP  ? ?Recommendations at discharge:  ? ?1) Follow up with Ortho ?2) Follow up with Cardiology ?3) PT exercises at home ?4) Your synthroid dose was increased ?5) You have been started on amiodarone ? ?Discharge Diagnoses: ?Status post ORIF, left periprosthetic femur fracture with femoral stem revision, POD 2 ?Rapid A-fib status post TEE/DCCV ?Acute kidney injury, resolved ?Hypothyroidism, mildly uncontrolled ? ?Hospital Course: ?Patient is a 46 year old male with past medical history significant for paroxysmal atrial fibrillation on Xarelto, hypothyroidism secondary to Hashimoto's thyroiditis and osteoarthritis of the left hip status post left hip arthroplasty on 08/15/2021.  Patient was admitted following a fall at home with subsequent left hip periprosthetic fracture.  Orthopedics was consulted and the patient underwent surgical intervention.  He is status post ORIF of his left periprosthetic femur fracture with a femoral stem revision.  Postop day 2 now.,  Orthopedic standpoint for discharge.  Foley catheter has been removed yesterday.  Working with PT and OT.  The plan is he will do physical therapy exercises at home.  Also was noted to be initially in rapid A-fib and required Cardizem drip.  Eventually Cardizem drip weaned off.  Post operatively while working with PT yesterday he went back into rapid A-fib with heart rate in the 170s to 180s.  Already on dual AV nodal blocker therapy with Cardizem CD and Toprol-XL.  Missed a few days of his Xarelto around the time of his surgery.  Cardiology was consulted and the patient underwent a TEE/DCCV and is back in sinus rhythm.  He has been started on amiodarone and he will follow-up with cardiology outpatient.  Also follow-up with orthopedics outpatient as  well.  If he changes his mind and wants to pursue outpatient physical therapy he will let his orthopedic surgeon know or his primary care physician.  Of note his TSH was mildly elevated and his home Synthroid dose was increased from 100 mcg to 112 mcg daily.  He will follow-up with his primary care physician for repeat TSH in 6 weeks outpatient.  Cleared from a cardiology perspective for discharge. ? ?Admitting diagnosis: ?Left periprosthetic fracture ?Leukocytosis ?A-fib with RVR ?Acute kidney injury ?Hypothyroidism, Hashimoto's thyroiditis ? ? ?Consultants: Ortho, cardiology ?Procedures performed: TEE/DCCV, ORIF ?Disposition: Home ?Diet recommendation:  ?Discharge Diet Orders (From admission, onward)  ? ?  Start     Ordered  ? 09/07/21 0000  Diet - low sodium heart healthy       ? 09/07/21 1415  ? ?  ?  ? ?  ? ?Cardiac diet ?DISCHARGE MEDICATION: ?Allergies as of 09/07/2021   ?No Known Allergies ?  ? ?  ?Medication List  ?  ? ?TAKE these medications   ? ?amiodarone 400 MG tablet ?Commonly known as: PACERONE ?Take 1 tablet (400 mg total) by mouth 2 (two) times daily. ?  ?amiodarone 200 MG tablet ?Commonly known as: PACERONE ?Take 1 tablet (200 mg total) by mouth daily. ?Start taking on: September 14, 2021 ?  ?diltiazem 240 MG 24 hr capsule ?Commonly known as: TIAZAC ?TAKE 1 CAPSULE BY MOUTH DAILY ?What changed:  ?how much to take ?how to take this ?when to take this ?additional instructions ?  ?HYDROcodone-acetaminophen 5-325 MG tablet ?Commonly known as: NORCO/VICODIN ?Take 1-2 tablets by mouth every 6 (six) hours  as needed for severe pain. ?  ?levothyroxine 112 MCG tablet ?Commonly known as: SYNTHROID ?Take 1 tablet (112 mcg total) by mouth daily at 6 (six) AM. ?Start taking on: September 08, 2021 ?What changed:  ?medication strength ?how much to take ?when to take this ?  ?methocarbamol 500 MG tablet ?Commonly known as: ROBAXIN ?Take 1 tablet (500 mg total) by mouth every 6 (six) hours as needed for muscle spasms. ?   ?metoprolol succinate 50 MG 24 hr tablet ?Commonly known as: TOPROL-XL ?TAKE 1 TABLET(50 MG) BY MOUTH DAILY WITH OR IMMEDIATELY FOLLOWING A MEAL ?What changed: See the new instructions. ?  ?rivaroxaban 20 MG Tabs tablet ?Commonly known as: XARELTO ?Take 1 tablet (20 mg total) by mouth daily with supper. ?What changed: when to take this ?  ?SYSTANE OP ?Place 4-5 drops into both eyes 2 (two) times daily. ?  ?telmisartan 20 MG tablet ?Commonly known as: MICARDIS ?TAKE 1 TABLET BY MOUTH DAILY ?What changed:  ?how much to take ?how to take this ?when to take this ?additional instructions ?  ?traMADol 50 MG tablet ?Commonly known as: ULTRAM ?Take 1-2 tablets (50-100 mg total) by mouth every 6 (six) hours as needed for moderate pain. ?  ? ?  ? ?  ?  ? ? ?  ?Discharge Care Instructions  ?(From admission, onward)  ?  ? ? ?  ? ?  Start     Ordered  ? 09/07/21 0000  If the dressing is still on your incision site when you go home, remove it on the third day after your surgery date. Remove dressing if it begins to fall off, or if it is dirty or damaged before the third day.       ? 09/07/21 1415  ? ?  ?  ? ?  ? ? Follow-up Information   ? ? Ollen Gross, MD. Schedule an appointment as soon as possible for a visit in 2 week(s).   ?Specialty: Orthopedic Surgery ?Contact information: ?[address] ?[address] ?[place] [address] ?[phone] ? ? ?  ?  ? ? Lanier Prude, MD Follow up.   ?Specialties: Cardiology, Radiology ?Why: at your already scheduled appointment ?Contact information: ?[address] ?[address] ?[place] [address] ?[phone] ? ? ?  ?  ? ?  ?  ? ?  ? ?Discharge Exam: ?Filed Weights  ? 09/04/21 0500 09/05/21 0500 09/07/21 0500  ?Weight: 80.7 kg 81.8 kg 81.1 kg  ? ?Examination: ?  ?General exam: Appears calm and comfortable ?HEENT: Patient is PERRLA.  No jaundice. ?Neck: Supple.  No raised JVD. ?Respiratory system: Clear to auscultation. Respiratory effort normal. ?Cardiovascular system: S1 & S2,  regular rate, regular rhythm ?Gastrointestinal system: Abdomen is nondistended, soft and nontender. No organomegaly or masses felt. Normal bowel sounds heard. ?Central nervous system: Alert and oriented.  Patient moves all extremities.   ?Extremities: Dressing clean dry and intact  No lower leg edema. ? ?Condition at discharge: good ? ?The results of significant diagnostics from this hospitalization (including imaging, microbiology, ancillary and laboratory) are listed below for reference.  ? ?Imaging Studies: ?DG Pelvis Portable ? ?Result Date: 09/05/2021 ?CLINICAL DATA:  Status post left hip replacement. EXAM: PORTABLE PELVIS 1-2 VIEWS COMPARISON:  August 15, 2021. FINDINGS: The left acetabular and femoral components are well situated. Expected postoperative changes are noted in the surrounding soft tissues. IMPRESSION: The status post left total hip arthroplasty. Electronically Signed   By: Zenda Alpers.D.  On: 09/05/2021 17:00  ? ?DG Pelvis Portable ? ?Result Date: 08/15/2021 ?CLINICAL DATA:  Status post left hip arthroplasty EXAM: PORTABLE PELVIS 1-2 VIEWS COMPARISON:  None. FINDINGS: There is evidence of recent left hip arthroplasty. There is faint radiolucent line in the medial cortex of proximal shaft of left femur immediately below the lesser trochanter. No similar finding is seen in the right femur. There are pockets of air in the soft tissues. IMPRESSION: Status post left hip arthroplasty. Faint thin linear lucency is seen in the medial cortex of proximal shaft of left femur immediately below the lesser trochanter. This may represent undisplaced fracture. Less likely possibility would be nutrient foramen. These results will be called to the ordering clinician or representative by the Radiologist Assistant, and communication documented in the PACS or [employer]. Electronically Signed   By: Ernie Avena M.D.   On: 08/15/2021 16:27  ? ?DG Chest Port 1 View ? ?Result Date: 09/04/2021 ?CLINICAL  DATA:  Hip fracture EXAM: PORTABLE CHEST 1 VIEW COMPARISON:  09/01/2015 FINDINGS: The heart size and mediastinal contours are within normal limits. Both lungs are clear. The visualized skeletal structures

## 2021-09-07 NOTE — TOC Initial Note (Addendum)
Transition of Care (TOC) - Initial/Assessment Note  ? ? ?Patient Details  ?Name: Walter MCGILLIS, MD ?MRN: [id number] ?Date of Birth: 1976-02-01 ? ?Transition of Care [facility]) CM/SW Contact:    ?Lanier Clam, RN ?Phone Number: ?09/07/2021, 9:54 AM ? ?Clinical Narrative: d/c plan home. Transfer to [facility] for TTE today.               ? ? ?Expected Discharge Plan: Home/Self Care ?Barriers to Discharge: Continued Medical Work up ? ? ?Patient Goals and CMS Choice ?Patient states their goals for this hospitalization and ongoing recovery are:: Home ?CMS Medicare.gov Compare Post Acute Care list provided to:: Patient ?Choice offered to / list presented to : Patient ? ?Expected Discharge Plan and Services ?Expected Discharge Plan: Home/Self Care ?  ?Discharge Planning Services: CM Consult ?Post Acute Care Choice: NA ?Living arrangements for the past 2 months: Single Family Home ?                ?  ?  ?  ?  ?  ?  ?  ?  ?  ?  ? ?Prior Living Arrangements/Services ?Living arrangements for the past 2 months: Single Family Home ?Lives with:: Spouse ?  ?Do you feel safe going back to the place where you live?: Yes      ?  ?  ?Current home services: DME (rw) ?  ? ?Activities of Daily Living ?Home Assistive Devices/Equipment: None ?ADL Screening (condition at time of admission) ?Patient's cognitive ability adequate to safely complete daily activities?: Yes ?Is the patient deaf or have difficulty hearing?: No ?Does the patient have difficulty seeing, even when wearing glasses/contacts?: No ?Does the patient have difficulty concentrating, remembering, or making decisions?: No ?Patient able to express need for assistance with ADLs?: Yes ?Does the patient have difficulty dressing or bathing?: No ?Independently performs ADLs?: Yes (appropriate for developmental age) ?Does the patient have difficulty walking or climbing stairs?: No ?Weakness of Legs: Left ?Weakness of Arms/Hands: None ? ?Permission Sought/Granted ?Permission sought to share  information with : Case Manager ?Permission granted to share information with : Yes, Verbal Permission Granted ? Share Information with NAME: Case manager ?   ?   ?   ? ?Emotional Assessment ?  ?  ?  ?  ?  ?  ? ?Admission diagnosis:  Atrial fibrillation with RVR (HCC) [I48.91] ?Closed left hip fracture (HCC) [S72.002A] ?Closed fracture of left hip, initial encounter (HCC) [S72.002A] ?Patient Active Problem List  ? Diagnosis Date Noted  ? Paroxysmal atrial fibrillation with RVR (HCC) 09/04/2021  ? AKI (acute kidney injury) (HCC) 09/04/2021  ? Leukocytosis 09/04/2021  ? Closed left hip fracture (HCC) 09/03/2021  ? Deformity of left hip joint 09/11/2019  ? Primary osteoarthritis of left hip 10/08/2018  ? Benign essential hypertension 10/08/2018  ? Atrial fibrillation with RVR (HCC) 10/08/2018  ? Hypothyroidism 10/08/2018  ? ?PCP:  Olive Bass, FNP ?Pharmacy:   ?[id number] [place], [place] - [address] AT [facility] [address] [facility] [address] [address] ?[address] [place] [address] ?Phone: [phone] Fax: [phone] ? ? ? ? ?Social Determinants of Health (SDOH) Interventions ?Food Insecurity Interventions: Intervention Not Indicated ?Financial Strain Interventions: Intervention Not Indicated ?Housing Interventions: Intervention Not Indicated ?Intimate Partner Violence Interventions: Intervention Not Indicated ?Physical Activity Interventions: Intervention Not Indicated ?Stress Interventions: Intervention Not Indicated ?Transportation Interventions: Intervention Not Indicated ? ?Readmission Risk Interventions ?   ? View : No data to display.  ?  ?  ?  ? ? ? ?

## 2021-09-07 NOTE — Progress Notes (Addendum)
? ?Progress Note ? ?Patient Name: Walter Riggs, MD ?Date of Encounter: 09/07/2021 ? ?[facility] Cardiologist: Donato Schultz, MD  ? ?Subjective  ? ?Patient remains in Afib with occasionally elevated rates. He is comfortable in bed.  ? ?Inpatient Medications  ?  ?Scheduled Meds: ? Chlorhexidine Gluconate Cloth  6 each Topical Daily  ? diltiazem  240 mg Oral Daily  ? docusate sodium  100 mg Oral BID  ? levothyroxine  112 mcg Oral Q0600  ? melatonin  5 mg Oral QHS  ? metoprolol succinate  50 mg Oral Daily  ? rivaroxaban  20 mg Oral Q breakfast  ? ?Continuous Infusions: ? methocarbamol (ROBAXIN) IV    ? ?PRN Meds: ?acetaminophen, bisacodyl, diphenhydrAMINE, HYDROcodone-acetaminophen, HYDROcodone-acetaminophen, menthol-cetylpyridinium **OR** phenol, methocarbamol **OR** methocarbamol (ROBAXIN) IV, metoCLOPramide **OR** metoCLOPramide (REGLAN) injection, morphine injection, naLOXone (NARCAN)  injection, ondansetron **OR** ondansetron (ZOFRAN) IV, polyethylene glycol, sodium phosphate, traMADol  ? ?Vital Signs  ?  ?Vitals:  ? 09/06/21 1930 09/07/21 0018 09/07/21 0424 09/07/21 0500  ?BP: 122/80 113/85 118/76   ?Pulse: 98 (!) 105 67   ?Resp: 18 18 18    ?Temp: 98.6 ?F (37 ?C) 98.8 ?F (37.1 ?C) 98.3 ?F (36.8 ?C)   ?TempSrc: Oral Oral Oral   ?SpO2: 100% 100% 99%   ?Weight:    81.1 kg  ?Height:      ? ? ?Intake/Output Summary (Last 24 hours) at 09/07/2021 0814 ?Last data filed at 09/07/2021 0425 ?Gross per 24 hour  ?Intake 171.67 ml  ?Output 2680 ml  ?Net -2508.33 ml  ? ? ?  09/07/2021  ?  5:00 AM 09/05/2021  ?  5:00 AM 09/04/2021  ?  5:00 AM  ?Last 3 Weights  ?Weight (lbs) 178 lb 12.8 oz 180 lb 5.4 oz 177 lb 14.6 oz  ?Weight (kg) 81.103 kg 81.8 kg 80.7 kg  ?   ? ?Telemetry  ?  ?Afib HR 80-120s - Personally Reviewed ? ?ECG  ?  ?pending - Personally Reviewed ? ?Physical Exam  ? ?GEN: No acute distress.   ?Neck: No JVD ?Cardiac: Irreg IRreg, no murmurs, rubs, or gallops.  ?Respiratory: Clear to auscultation bilaterally. ?GI: Soft,  nontender, non-distended  ?MS: No edema; No deformity. ?Neuro:  Nonfocal  ?Psych: Normal affect  ? ?Labs  ?  ?High Sensitivity Troponin:  No results for input(s): TROPONINIHS in the last 720 hours.   ?Chemistry ?Recent Labs  ?Lab 09/03/21 ?1927 09/04/21 ?0403 09/06/21 ?0422 09/07/21 ?0440  ?NA  --  134* 133* 136  ?K  --  4.0 4.3 4.2  ?CL  --  98 98 102  ?CO2  --  27 24 27   ?GLUCOSE  --  115* 131* 139*  ?BUN  --  16 12 14   ?CREATININE  --  0.97 0.78 0.83  ?CALCIUM  --  9.0 8.8* 8.9  ?MG 2.4 2.1  --  2.2  ?PROT  --  7.2  --   --   ?ALBUMIN  --  3.8  --   --   ?AST  --  29  --   --   ?ALT  --  14  --   --   ?ALKPHOS  --  68  --   --   ?BILITOT  --  1.0  --   --   ?GFRNONAA  --  >60 >60 >60  ?ANIONGAP  --  9 11 7   ?  ?Lipids No results for input(s): CHOL, TRIG, HDL, LABVLDL, LDLCALC, CHOLHDL in the last 168  hours.  ?Hematology ?Recent Labs  ?Lab 09/04/21 ?0403 09/06/21 ?0422 09/07/21 ?0440  ?WBC 9.4 8.7 10.8*  ?RBC 3.48* 2.85* 2.67*  ?HGB 10.6* 8.6* 8.3*  ?HCT 31.8* 26.2* 24.2*  ?MCV 91.4 91.9 90.6  ?MCH 30.5 30.2 31.1  ?MCHC 33.3 32.8 34.3  ?RDW 13.1 12.4 12.6  ?PLT 461* 401* 414*  ? ?Thyroid  ?Recent Labs  ?Lab 09/04/21 ?0403  ?TSH 5.133*  ?  ?BNPNo results for input(s): BNP, PROBNP in the last 168 hours.  ?DDimer No results for input(s): DDIMER in the last 168 hours.  ? ?Radiology  ?  ?DG Pelvis Portable ? ?Result Date: 09/05/2021 ?CLINICAL DATA:  Status post left hip replacement. EXAM: PORTABLE PELVIS 1-2 VIEWS COMPARISON:  August 15, 2021. FINDINGS: The left acetabular and femoral components are well situated. Expected postoperative changes are noted in the surrounding soft tissues. IMPRESSION: The status post left total hip arthroplasty. Electronically Signed   By: Lupita Raider M.D.   On: 09/05/2021 17:00   ? ?Cardiac Studies  ? ?Echocardiogram 08/12/21  ? 1. Left ventricular ejection fraction, by estimation, is 45 to 50% but  ?may be underestimated in setting of atrial fibrillation and significant   ?septal/lateral dysynchrony from BBB. The left ventricle has mildly  ?decreased function. The left ventricle  ?demonstrates global hypokinesis. There is mild concentric left ventricular  ?hypertrophy. Left ventricular diastolic function could not be evaluated.  ? 2. Right ventricular systolic function is normal. The right ventricular  ?size is normal.  ? 3. The mitral valve is normal in structure. Mild mitral valve  ?regurgitation. No evidence of mitral stenosis.  ? 4. The aortic valve is normal in structure. Aortic valve regurgitation is  ?trivial. No aortic stenosis is present.  ? 5. Aortic dilatation noted. There is mild dilatation of the aortic root,  ?measuring 41 mm.  ? 6. The inferior vena cava is normal in size with greater than 50%  ?respiratory variability, suggesting right atrial pressure of 3 mmHg.  ?  ? ?Patient Profile  ?   ?46 y.o. male with a hx of permanent atrial fibrillation,  typical atrial flutter, nonischemic cardiomyopathy, hypothyroidism who is being seen 09/06/2021 for the evaluation of atrial fibrillation  ? ?Assessment & Plan  ? ?Atrial Fibrillation with RVR  ?- H/o permanent Afib, s/p 1 failed cardioversion and rates in afib have been difficult to control.  ?- plan for ablation this coming June ?- CHADS-VASc 2 (HTN, CHF)  on Xarelto ?- xarelto was held surrounding hip surgery so he missed a few doses  ?- continue dilitazem 240 mg daily and Toprol 50 mg daily for rate control ?- Plan for TEE/DCCV today ?- plan to start amio following procedure ?- may be able to d/c from [facility] if stable ? ?For questions or updates, please contact [facility] ?Please consult [email or web address] for contact info under  ? ?  ?   ?Signed, ?Cadence David Stall, PA-C  ?09/07/2021, 8:14 AM    ? ?Patient seen and examined with Cadence Furth PA-C.  Agree as above, with the following exceptions and changes as noted below. Seen briefly before cardioversion at [facility]. Gen: NAD Neuro/Psych: alert and oriented x 3, normal mood and  affect. All available labs, radiology testing, previous records reviewed. Will initate amiodarone load and daily dosing in addition to diltiazem and metoprolol, to be continued until follow up with Dr. Lalla Brothers. Amiodarone 400 mg BID for 7 days and then amiodarone 200 mg daily afterward. Continue Xarelto without interruption after  cardioversion attempt. ? ?Parke Poisson, MD ?09/07/21 12:20 PM ? ?

## 2021-09-07 NOTE — Progress Notes (Addendum)
? ?  Subjective: ?2 Days Post-Op Procedure(s) (LRB): ?OPEN REDUCTION INTERNAL FIXATION (ORIF) LEFT PERIPROSTHETIC FRACTURE, POSSIBLE FEMUR REVISION (Left) ?Patient reports pain as mild.   ?Patient seen in rounds by Dr. Wynelle Link. ?Patient reports the left hip continues to feel better. Did well with physical therapy yesterday. Went into rapid afib yesterday, being transferred to [facility] today.  ?Plan is to go Home after hospital stay. ? ?Objective: ?Vital signs in last 24 hours: ?Temp:  [98.3 ?F (36.8 ?C)-98.8 ?F (37.1 ?C)] 98.3 ?F (36.8 ?C) (04/12 0424) ?Pulse Rate:  [67-118] 67 (04/12 0424) ?Resp:  [15-18] 18 (04/12 0424) ?BP: (113-152)/(76-100) 118/76 (04/12 0424) ?SpO2:  [99 %-100 %] 99 % (04/12 0424) ?Weight:  [81.1 kg] 81.1 kg (04/12 0500) ? ?Intake/Output from previous day: ? ?Intake/Output Summary (Last 24 hours) at 09/07/2021 0836 ?Last data filed at 09/07/2021 0425 ?Gross per 24 hour  ?Intake 171.67 ml  ?Output 2680 ml  ?Net -2508.33 ml  ?  ?Intake/Output this shift: ?No intake/output data recorded. ? ?Labs: ?Recent Labs  ?  09/06/21 ?0422 09/07/21 ?[id number]  ?HGB 8.6* 8.3*  ? ?Recent Labs  ?  09/06/21 ?0422 09/07/21 ?0440  ?WBC 8.7 10.8*  ?RBC 2.85* 2.67*  ?HCT 26.2* 24.2*  ?PLT 401* 414*  ? ?Recent Labs  ?  09/06/21 ?0422 09/07/21 ?0440  ?NA 133* 136  ?K 4.3 4.2  ?CL 98 102  ?CO2 24 27  ?BUN 12 14  ?CREATININE 0.78 0.83  ?GLUCOSE 131* 139*  ?CALCIUM 8.8* 8.9  ? ?Recent Labs  ?  09/06/21 ?1818  ?INR 1.4*  ? ? ?Exam: ?General - Patient is Alert and Oriented ?Extremity - Neurologically intact ?Neurovascular intact ?Sensation intact distally ?Dorsiflexion/Plantar flexion intact ?Dressing/Incision - clean, dry, no drainage ?Motor Function - intact, moving foot and toes well on exam.  ? ?Past Medical History:  ?Diagnosis Date  ? Arthritis   ? Cardiomyopathy ([place])   ? Dysrhythmia   ? Hypothyroidism   ? Persistent atrial fibrillation ([place])   ? Typical atrial flutter ([place])   ? ? ?Assessment/Plan: ?2 Days Post-Op Procedure(s)  (LRB): ?OPEN REDUCTION INTERNAL FIXATION (ORIF) LEFT PERIPROSTHETIC FRACTURE, POSSIBLE FEMUR REVISION (Left) ?Principal Problem: ?  Closed left hip fracture ([place]) ?Active Problems: ?  Atrial fibrillation with RVR ([place]) ?  Hypothyroidism ?  Paroxysmal atrial fibrillation with RVR ([place]) ?  AKI (acute kidney injury) ([place]) ?  Leukocytosis ? ?Estimated body mass index is 24.94 kg/m? as calculated from the following: ?  Height as of this encounter: 5\' 11"  (1.803 m). ?  Weight as of this encounter: 81.1 kg. ?Up with therapy ? ?DVT Prophylaxis - Xarelto ?PWB 50% to the left hip ? ?Being transferred to [facility] for TTE ?Ortho signing off, will follow-up in the office with [place] in 2 weeks ? ?Does not need new prescriptions for pain medications, already has at home.  ? ?Theresa Duty, PA-C ?Orthopedic Surgery ?[id number] ?09/07/2021, 8:36 AM ? ?

## 2021-09-07 NOTE — TOC Transition Note (Signed)
Transition of Care (TOC) - CM/SW Discharge Note ? ? ?Patient Details  ?Name: Walter ADAMI, MD ?MRN: [id number] ?Date of Birth: Apr 25, 1976 ? ?Transition of Care [facility]) CM/SW Contact:  ?Lanier Clam, RN ?Phone Number: ?09/07/2021, 2:16 PM ? ? ?Clinical Narrative:   d/c home no needs. ? ? ? ?Final next level of care: Home/Self Care ?Barriers to Discharge: No Barriers Identified ? ? ?Patient Goals and CMS Choice ?Patient states their goals for this hospitalization and ongoing recovery are:: Home ?CMS Medicare.gov Compare Post Acute Care list provided to:: Patient ?Choice offered to / list presented to : Patient ? ?Discharge Placement ?  ?           ?  ?  ?  ?  ? ?Discharge Plan and Services ?  ?Discharge Planning Services: CM Consult ?Post Acute Care Choice: NA          ?  ?  ?  ?  ?  ?  ?  ?  ?  ?  ? ?Social Determinants of Health (SDOH) Interventions ?Food Insecurity Interventions: Intervention Not Indicated ?Financial Strain Interventions: Intervention Not Indicated ?Housing Interventions: Intervention Not Indicated ?Intimate Partner Violence Interventions: Intervention Not Indicated ?Physical Activity Interventions: Intervention Not Indicated ?Stress Interventions: Intervention Not Indicated ?Transportation Interventions: Intervention Not Indicated ? ? ?Readmission Risk Interventions ?   ? View : No data to display.  ?  ?  ?  ? ? ? ? ? ?

## 2021-09-07 NOTE — Anesthesia Postprocedure Evaluation (Signed)
Anesthesia Post Note ? ?Patient: Walter Grater, MD ? ?Procedure(s) Performed: TRANSESOPHAGEAL ECHOCARDIOGRAM (TEE) ?CARDIOVERSION ? ?  ? ?Patient location during evaluation: PACU ?Anesthesia Type: MAC ?Level of consciousness: awake and alert ?Pain management: pain level controlled ?Vital Signs Assessment: post-procedure vital signs reviewed and stable ?Respiratory status: spontaneous breathing, nonlabored ventilation and respiratory function stable ?Cardiovascular status: blood pressure returned to baseline and stable ?Postop Assessment: no apparent nausea or vomiting ?Anesthetic complications: no ? ? ?No notable events documented. ? ?Last Vitals:  ?Vitals:  ? 09/07/21 1245 09/07/21 1403  ?BP: 113/77 (!) 135/101  ?Pulse: 76 67  ?Resp: 16 18  ?Temp:  36.9 ?C  ?SpO2: 100% 97%  ?  ?Last Pain:  ?Vitals:  ? 09/07/21 1403  ?TempSrc: Oral  ?PainSc:   ? ? ?  ?  ?  ?  ?  ?  ? ?Walter [name] ? ? ? ? ?

## 2021-09-07 NOTE — Anesthesia Procedure Notes (Signed)
Procedure Name: [place] ?Date/Time: 09/07/2021 11:58 AM ?Performed by: Lorie Phenix, CRNA ?Pre-anesthesia Checklist: Patient identified, Emergency Drugs available, Suction available and Patient being monitored ?Patient Re-evaluated:Patient Re-evaluated prior to induction ?Oxygen Delivery Method: Nasal cannula ?Placement Confirmation: positive ETCO2 ? ? ? ? ?

## 2021-09-07 NOTE — Progress Notes (Signed)
OT Cancellation Note ? ?Patient Details ?Name: Walter TRIGUEROS, MD ?MRN: [id number] ?DOB: [birth year] ? ? ?Cancelled Treatment:    Reason Eval/Treat Not Completed: Patient at procedure or test/ unavailable. Plans for patient to have TEE cardioversion today. Will hold today and f/u as able. ? ?Kelli Churn ?09/07/2021, 7:59 AM ?

## 2021-09-07 NOTE — Procedures (Signed)
Procedure: Electrical Cardioversion ?Indications:  Atrial Fibrillation ? ?Procedure Details: ? ?Consent: Risks of procedure as well as the alternatives and risks of each were explained to the (patient/caregiver).  Consent for procedure obtained. ? ?Time Out: Verified patient identification, verified procedure, site/side was marked, verified correct patient position, special equipment/implants available, medications/allergies/relevent history reviewed, required imaging and test results available. PERFORMED. ? ?Patient placed on cardiac monitor, pulse oximetry, supplemental oxygen as necessary.  ?Sedation given:  Propfol 200mg  per CV anesthesia ?Pacer pads placed anterior and posterior chest. ? ?Cardioverted 1 time(s).  ?Cardioversion with synchronized biphasic 150J shock. ? ?Evaluation: ?Findings: Post procedure EKG shows: NSR ?Complications: None ?Patient did tolerate procedure well. ? ?Time Spent Directly with the Patient: ? ?TEE performed prior to DCCV. Please see separate notes for details. ? ?40 minutes  ? ? ?09/07/2021, 12:09 PM  ?

## 2021-09-07 NOTE — Progress Notes (Addendum)
Physical Therapy Treatment ?Patient Details ?Name: Walter STROBLE, MD ?MRN: [id number] ?DOB: July 19, 1975 ?Today's Date: 09/07/2021 ? ? ?History of Present Illness 46 YO male admitted after fall, sustained L periprosthetic femur fracture  on 09/03/21. S/P OPEN REDUCTION INTERNAL FIXATION (ORIF) LEFT PERIPROSTHETIC FRACTURE with femoral stem revision and cables. PMH: significant for OA, hypothyroidism, A-flutter, A-fib, cardiomyopathy, s/p cardioversion in 2019. ? ?  ?PT Comments  ? ? NOTE: There was some confusion during chart review regarding pt's LLE WB status (WBAT or PWB at 50%); messaged PA prior to session and hadn't received response by time of session so directed pt to maintain PWB at 50% per previous orders, would appreciate clarification on current WB status from orthopedic MD.  ? ?Pt sitting in recliner upon entry. Provided posterior hip precaution handout and went over precautions verbally and with PT demonstrating movements to avoid; pt verbalized understanding. Educated and reviewed 50% PWB status and pt was able to maintain appropriate weightbearing throughout session. . Pt min guard for transfers and ambulation with RW in hallway, HR monitored throughout with high of 165 mid-ambulation. Provided education on appropriate stair management with RW upon return home and recommended pt avoid full flights of stairs until later in rehabilitation, pt verbalized understanding. We will continue to follow pt to promote modified independence with functional mobility and safe discharge to the destination indicated below.  ?   ?Recommendations for follow up therapy are one component of a multi-disciplinary discharge planning process, led by the attending physician.  Recommendations may be updated based on patient status, additional functional criteria and insurance authorization. ? ?Follow Up Recommendations ? Follow physician's recommendations for discharge plan and follow up therapies ?  ?  ?Assistance Recommended at  Discharge Intermittent Supervision/Assistance  ?Patient can return home with the following A little help with walking and/or transfers;A little help with bathing/dressing/bathroom;Assistance with cooking/housework;Help with stairs or ramp for entrance;Assist for transportation ?  ?Equipment Recommendations ? None recommended by PT (pt has recommended DME)  ?  ?Recommendations for Other Services   ? ? ?  ?Precautions / Restrictions Precautions ?Precautions: Fall;Posterior Hip ?Precaution Booklet Issued: Yes (comment) (Posterior hip precautions) ?Precaution Comments: Afib, recent fall ?Restrictions ?Weight Bearing Restrictions: Yes ?LLE Weight Bearing: Partial weight bearing ?LLE Partial Weight Bearing Percentage or Pounds: 50  ?  ? ?Mobility ? Bed Mobility ?  ?  ?  ?  ?  ?  ?  ?General bed mobility comments: Pt in recliner upon entry and exit ?  ? ?Transfers ?Overall transfer level: Needs assistance ?Equipment used: Rolling walker (2 wheels) ?Transfers: Sit to/from Stand ?Sit to Stand: From elevated surface, Min guard ?  ?  ?  ?  ?  ?General transfer comment: Pt min guard for safety and cuing only, no physical assist required. Verbal cues for left leg position, PWB status, posterior hip precautions. Pt requested to not wear gait belt. ?  ? ?Ambulation/Gait ?Ambulation/Gait assistance: Supervision, Min guard ?Gait Distance (Feet): 60 Feet ?Assistive device: Rolling walker (2 wheels) ?Gait Pattern/deviations: Step-to pattern, Decreased step length - left, Decreased stance time - left, Decreased weight shift to left ?Gait velocity: decreased ?  ?  ?General Gait Details: Pt min guard to supervision for safety only, no physical assist required and pt requested to not wear gait belt. Vitals especially heart rate monitored during ambulation; ranged from 105 sitting at rest to 165 mid-ambulation via telemetry, pt reporting no symptoms or distress. Demonstrated good PWB using BUE support on RW during stance phase  with LLE,  decreased stance time on L, step-to pattern. No overt LOB noted, pt able to talk throughout ambulation task. ? ? ?Stairs ?  ?  ?  ?  ?General stair comments: Educated pt on safe stair technique with RW and assist from caregiver with demonstrations, pt verbalized understanding. Recommended delaying ascending/descending full flight of stairs until later in rehab process, pt agreeable. ? ? ?Wheelchair Mobility ?  ? ?Modified Rankin (Stroke Patients Only) ?  ? ? ?  ?Balance Overall balance assessment: Needs assistance ?Sitting-balance support: Feet supported, No upper extremity supported ?Sitting balance-Leahy Scale: Good ?  ?  ?Standing balance support: Bilateral upper extremity supported, During functional activity, Reliant on assistive device for balance ?Standing balance-Leahy Scale: Poor ?  ?  ?  ?  ?  ?  ?  ?  ?  ?  ?  ?  ?  ? ?  ?Cognition Arousal/Alertness: Awake/alert ?Behavior During Therapy: [facility] for tasks assessed/performed ?Overall Cognitive Status: Within Functional Limits for tasks assessed ?  ?  ?  ?  ?  ?  ?  ?  ?  ?  ?  ?  ?  ?  ?  ?  ?  ?  ?  ? ?  ?Exercises   ? ?  ?General Comments   ?  ?  ? ?Pertinent Vitals/Pain Pain Assessment ?Pain Assessment: Faces ?Faces Pain Scale: Hurts a little bit ?Pain Location: L hip ?Pain Descriptors / Indicators: Operative site guarding, Discomfort, Tightness, Cramping, Sore ?Pain Intervention(s): Limited activity within patient's tolerance, Monitored during session, Repositioned  ? ? ?Home Living   ?  ?  ?  ?  ?  ?  ?  ?  ?  ?   ?  ?Prior Function    ?  ?  ?   ? ?PT Goals (current goals can now be found in the care plan section) Acute Rehab PT Goals ?Patient Stated Goal: To walk ?PT Goal Formulation: With patient ?Time For Goal Achievement: 09/13/21 ?Potential to Achieve Goals: Good ?Progress towards PT goals: Progressing toward goals ? ?  ?Frequency ? ? ? Min 6X/week ? ? ? ?  ?PT Plan Current plan remains appropriate  ? ? ?Co-evaluation   ?  ?  ?  ?  ? ?  ?AM-PAC PT "6  Clicks" Mobility   ?Outcome Measure ? Help needed turning from your back to your side while in a flat bed without using bedrails?: A Little ?Help needed moving from lying on your back to sitting on the side of a flat bed without using bedrails?: A Little ?Help needed moving to and from a bed to a chair (including a wheelchair)?: A Little ?Help needed standing up from a chair using your arms (e.g., wheelchair or bedside chair)?: A Little ?Help needed to walk in hospital room?: A Little ?Help needed climbing 3-5 steps with a railing? : A Lot ?6 Click Score: 17 ? ?  ?End of Session Equipment Utilized During Treatment: Gait belt ?Activity Tolerance: Patient tolerated treatment well ?Patient left: in chair;with call bell/phone within reach;with nursing/sitter in room ?Nurse Communication: Mobility status ?PT Visit Diagnosis: Muscle weakness (generalized) (M62.81);Difficulty in walking, not elsewhere classified (R26.2) ?  ? ? ?Time: 1000-1022 ?PT Time Calculation (min) (ACUTE ONLY): 22 min ? ?Charges:  $Gait Training: 8-22 mins          ?          ?Coolidge Breeze, PT, DPT ?[facility] ?Office: [phone] ?Pager: [phone] ? ? ?  Coolidge Breeze ?09/07/2021, 10:34 AM ? ?

## 2021-09-08 ENCOUNTER — Encounter (HOSPITAL_COMMUNITY): Payer: Self-pay | Admitting: Cardiology

## 2021-09-13 ENCOUNTER — Other Ambulatory Visit (HOSPITAL_COMMUNITY): Payer: BC Managed Care – PPO

## 2021-10-26 ENCOUNTER — Telehealth (HOSPITAL_COMMUNITY): Payer: Self-pay | Admitting: *Deleted

## 2021-10-26 NOTE — Telephone Encounter (Signed)
Attempted to call patient regarding upcoming cardiac CT appointment. °Left message on voicemail with name and callback number ° °Drayke Grabel RN Navigator Cardiac Imaging °[facility] °[phone] Office °[phone] Cell ° °

## 2021-10-26 NOTE — Telephone Encounter (Signed)
Patient returning call regarding upcoming cardiac imaging study; pt verbalizes understanding of appt date/time, parking situation and where to check in, pre-test NPO status, and verified current allergies; name and call back number provided for further questions should they arise  Gordy Clement RN Navigator Cardiac Imaging [facility] [phone] office [phone] cell  Patient to take his daily AM medications two hours prior to his CT scan.  He is aware to arrive at 7:30am.

## 2021-10-28 ENCOUNTER — Ambulatory Visit (HOSPITAL_COMMUNITY)
Admission: RE | Admit: 2021-10-28 | Discharge: 2021-10-28 | Disposition: A | Discharge status: Home / Self Care | Payer: Managed Care, Other (non HMO) | Source: Ambulatory Visit | Attending: Cardiology | Admitting: Cardiology

## 2021-10-28 DIAGNOSIS — I4819 Other persistent atrial fibrillation: Secondary | ICD-10-CM

## 2021-10-28 MED ORDER — IOHEXOL 350 MG/ML SOLN
100.0000 mL | Freq: Once | INTRAVENOUS | Status: AC | PRN
  Administered 2021-10-28: 100 mL via INTRAVENOUS

## 2021-11-01 ENCOUNTER — Telehealth: Payer: Self-pay | Admitting: Cardiology

## 2021-11-01 NOTE — Telephone Encounter (Signed)
Patient states he is having an ablation done on Friday.  He wants to know if he needs to stop his XARELTO.  He did take it this morning.

## 2021-11-01 NOTE — Telephone Encounter (Signed)
Left message to call back and also that I would send him a mychart message.

## 2021-11-02 ENCOUNTER — Encounter (HOSPITAL_COMMUNITY): Payer: Self-pay | Admitting: Cardiology

## 2021-11-03 NOTE — Pre-Procedure Instructions (Signed)
Instructed patient on the following items: Arrival time 0830 Nothing to eat or drink after midnight No meds AM of procedure Responsible person to drive you home and stay with you for 24 hrs  Have you missed any doses of anti-coagulant Xarelto-hasn't missed any doses   

## 2021-11-03 NOTE — Anesthesia Preprocedure Evaluation (Addendum)
Anesthesia Evaluation  Patient identified by MRN, date of birth, ID band Patient awake    Reviewed: Allergy & Precautions, NPO status , Patient's Chart, lab work & pertinent test results  History of Anesthesia Complications Negative for: history of anesthetic complications  Airway Mallampati: II  TM Distance: >3 FB Neck ROM: Full    Dental no notable dental hx. (+) Dental Advisory Given   Pulmonary neg pulmonary ROS,    Pulmonary exam normal        Cardiovascular hypertension, Pt. on medications and Pt. on home beta blockers +CHF  + dysrhythmias (eliquis) Atrial Fibrillation  Rhythm:Regular Rate:Normal  IMPRESSIONS   1. Left ventricular ejection fraction, by estimation, is 45 to 50%. The left ventricle has mildly decreased function. 2. Right ventricular systolic function is mildly reduced. The right ventricular size is normal. 3. Left atrial size was moderately dilated. No left atrial/left atrial appendage thrombus was detected. The LAA emptying velocity was 40 cm/s. 4. The mitral valve is normal in structure. Trivial mitral valve regurgitation. No evidence of mitral stenosis. 5. The aortic valve is tricuspid. Aortic valve regurgitation is trivial. No aortic stenosis is present. 6. Aortic dilatation noted. There is mild dilatation of the aortic root, measuring 42 mm. 7. Following TEE, the patient underwent DCCV with 150J x1 with return to NSR.   Neuro/Psych negative neurological ROS  negative psych ROS   GI/Hepatic negative GI ROS, Neg liver ROS,   Endo/Other  Hypothyroidism   Renal/GU   negative genitourinary   Musculoskeletal  (+) Arthritis , Osteoarthritis,    Abdominal   Peds negative pediatric ROS (+)  Hematology  (+) Blood dyscrasia, anemia , Hb 8.3   Anesthesia Other Findings S/p ORIF for femur fx 09/05/21 and then went into RVR  Reproductive/Obstetrics negative OB ROS                             Anesthesia Physical  Anesthesia Plan  ASA: 3  Anesthesia Plan: General   Post-op Pain Management: Minimal or no pain anticipated and Tylenol PO (pre-op)*   Induction: Intravenous  PONV Risk Score and Plan: 2 and Ondansetron and Midazolam  Airway Management Planned: Oral ETT  Additional Equipment: None  Intra-op Plan:   Post-operative Plan: Extubation in OR  Informed Consent: I have reviewed the patients History and Physical, chart, labs and discussed the procedure including the risks, benefits and alternatives for the proposed anesthesia with the patient or authorized representative who has indicated his/her understanding and acceptance.     Dental advisory given  Plan Discussed with: Anesthesiologist and CRNA  Anesthesia Plan Comments:        Anesthesia Quick Evaluation

## 2021-11-04 ENCOUNTER — Ambulatory Visit (HOSPITAL_COMMUNITY): Payer: Managed Care, Other (non HMO) | Admitting: Anesthesiology

## 2021-11-04 ENCOUNTER — Ambulatory Visit (HOSPITAL_COMMUNITY)
Admission: RE | Admit: 2021-11-04 | Discharge: 2021-11-04 | Disposition: A | Discharge status: Home / Self Care | Payer: Managed Care, Other (non HMO) | Attending: Cardiology | Admitting: Cardiology

## 2021-11-04 ENCOUNTER — Other Ambulatory Visit: Payer: Self-pay

## 2021-11-04 ENCOUNTER — Encounter (HOSPITAL_COMMUNITY): Payer: Self-pay | Admitting: Cardiology

## 2021-11-04 ENCOUNTER — Encounter (HOSPITAL_COMMUNITY)
Admission: RE | Disposition: A | Discharge status: Home / Self Care | Payer: Managed Care, Other (non HMO) | Source: Home / Self Care | Attending: Cardiology

## 2021-11-04 ENCOUNTER — Other Ambulatory Visit (HOSPITAL_COMMUNITY): Payer: Self-pay

## 2021-11-04 ENCOUNTER — Ambulatory Visit (HOSPITAL_BASED_OUTPATIENT_CLINIC_OR_DEPARTMENT_OTHER): Payer: Managed Care, Other (non HMO) | Admitting: Anesthesiology

## 2021-11-04 DIAGNOSIS — I4819 Other persistent atrial fibrillation: Secondary | ICD-10-CM | POA: Insufficient documentation

## 2021-11-04 DIAGNOSIS — I483 Typical atrial flutter: Secondary | ICD-10-CM

## 2021-11-04 DIAGNOSIS — I5022 Chronic systolic (congestive) heart failure: Secondary | ICD-10-CM | POA: Diagnosis not present

## 2021-11-04 DIAGNOSIS — I110 Hypertensive heart disease with heart failure: Secondary | ICD-10-CM | POA: Diagnosis not present

## 2021-11-04 DIAGNOSIS — I509 Heart failure, unspecified: Secondary | ICD-10-CM

## 2021-11-04 DIAGNOSIS — E039 Hypothyroidism, unspecified: Secondary | ICD-10-CM | POA: Diagnosis not present

## 2021-11-04 DIAGNOSIS — I4891 Unspecified atrial fibrillation: Secondary | ICD-10-CM | POA: Diagnosis not present

## 2021-11-04 DIAGNOSIS — I428 Other cardiomyopathies: Secondary | ICD-10-CM | POA: Insufficient documentation

## 2021-11-04 DIAGNOSIS — Z7901 Long term (current) use of anticoagulants: Secondary | ICD-10-CM | POA: Diagnosis not present

## 2021-11-04 HISTORY — PX: ATRIAL FIBRILLATION ABLATION: EP1191

## 2021-11-04 LAB — BASIC METABOLIC PANEL
Anion gap: 3 — ABNORMAL LOW (ref 5–15)
BUN: 9 mg/dL (ref 6–20)
CO2: 26 mmol/L (ref 22–32)
Calcium: 8.9 mg/dL (ref 8.9–10.3)
Chloride: 110 mmol/L (ref 98–111)
Creatinine, Ser: 1.18 mg/dL (ref 0.61–1.24)
GFR, Estimated: >60 mL/min (ref >60)
Glucose, Bld: 97 mg/dL (ref 70–99)
Potassium: 4 mmol/L (ref 3.5–5.1)
Sodium: 139 mmol/L (ref 135–145)

## 2021-11-04 LAB — CBC
HCT: 37.2 % — ABNORMAL LOW (ref 39.0–52.0)
Hemoglobin: 11.6 g/dL — ABNORMAL LOW (ref 13.0–17.0)
MCH: 27.6 pg (ref 26.0–34.0)
MCHC: 31.2 g/dL (ref 30.0–36.0)
MCV: 88.6 fL (ref 80.0–100.0)
Platelets: 291 10*3/uL (ref 150–400)
RBC: 4.2 MIL/uL — ABNORMAL LOW (ref 4.22–5.81)
RDW: 14.6 % (ref 11.5–15.5)
WBC: 3.7 10*3/uL — ABNORMAL LOW (ref 4.0–10.5)
nRBC: 0 % (ref 0.0–0.2)

## 2021-11-04 LAB — POCT ACTIVATED CLOTTING TIME
Activated Clotting Time: 275 seconds
Activated Clotting Time: 305 seconds

## 2021-11-04 SURGERY — ATRIAL FIBRILLATION ABLATION
Anesthesia: General

## 2021-11-04 MED ORDER — PANTOPRAZOLE SODIUM 40 MG PO TBEC
40.0000 mg | DELAYED_RELEASE_TABLET | Freq: Every day | ORAL | 0 refills | Status: DC
  Filled 2021-11-04: qty 45, 45d supply, fill #0

## 2021-11-04 MED ORDER — SODIUM CHLORIDE 0.9% FLUSH: 3.0000 mL | Freq: Two times a day (BID) | INTRAVENOUS | Status: DC

## 2021-11-04 MED ORDER — SODIUM CHLORIDE 0.9% FLUSH
3.0000 mL | INTRAVENOUS | Status: DC | PRN
Start: 2021-11-04 — End: 2021-11-04

## 2021-11-04 MED ORDER — ACETAMINOPHEN 500 MG PO TABS
1000.0000 mg | ORAL_TABLET | Freq: Once | ORAL | Status: AC
Start: 2021-11-04 — End: 2021-11-04
  Administered 2021-11-04: 1000 mg via ORAL
  Filled 2021-11-04: qty 2

## 2021-11-04 MED ORDER — HEPARIN SODIUM (PORCINE) 1000 UNIT/ML IJ SOLN
INTRAMUSCULAR | Status: DC | PRN
  Administered 2021-11-04: 13000 [IU] via INTRAVENOUS
  Administered 2021-11-04: 4000 [IU] via INTRAVENOUS
  Administered 2021-11-04: 5000 [IU] via INTRAVENOUS

## 2021-11-04 MED ORDER — HEPARIN (PORCINE) IN NACL 2-0.9 UNITS/ML
INTRAMUSCULAR | Status: AC | PRN
  Administered 2021-11-04 (×4): 500 mL

## 2021-11-04 MED ORDER — PANTOPRAZOLE SODIUM 40 MG PO TBEC
40.0000 mg | DELAYED_RELEASE_TABLET | Freq: Every day | ORAL | Status: DC
  Administered 2021-11-04: 40 mg via ORAL
  Filled 2021-11-04: qty 1

## 2021-11-04 MED ORDER — ONDANSETRON HCL 4 MG/2ML IJ SOLN
INTRAMUSCULAR | Status: DC | PRN
  Administered 2021-11-04: 4 mg via INTRAVENOUS

## 2021-11-04 MED ORDER — COLCHICINE 0.6 MG PO TABS
0.6000 mg | ORAL_TABLET | Freq: Two times a day (BID) | ORAL | 0 refills | Status: DC
  Filled 2021-11-04: qty 10, 5d supply, fill #0

## 2021-11-04 MED ORDER — HEPARIN (PORCINE) IN NACL 1000-0.9 UT/500ML-% IV SOLN
INTRAVENOUS | Status: AC
  Filled 2021-11-04: qty 500

## 2021-11-04 MED ORDER — MIDAZOLAM HCL 2 MG/2ML IJ SOLN
INTRAMUSCULAR | Status: DC | PRN
  Administered 2021-11-04: 2 mg via INTRAVENOUS

## 2021-11-04 MED ORDER — SODIUM CHLORIDE 0.9 % IV SOLN: 250.0000 mL | INTRAVENOUS | Status: DC | PRN

## 2021-11-04 MED ORDER — PHENYLEPHRINE 80 MCG/ML (10ML) SYRINGE FOR IV PUSH (FOR BLOOD PRESSURE SUPPORT)
PREFILLED_SYRINGE | INTRAVENOUS | Status: DC | PRN
  Administered 2021-11-04: 80 ug via INTRAVENOUS

## 2021-11-04 MED ORDER — SODIUM CHLORIDE 0.9 % IV SOLN: INTRAVENOUS | Status: DC

## 2021-11-04 MED ORDER — FENTANYL CITRATE (PF) 100 MCG/2ML IJ SOLN
INTRAMUSCULAR | Status: DC | PRN
  Administered 2021-11-04: 100 ug via INTRAVENOUS

## 2021-11-04 MED ORDER — COLCHICINE 0.6 MG PO TABS
0.6000 mg | ORAL_TABLET | Freq: Two times a day (BID) | ORAL | Status: DC
Start: 2021-11-04 — End: 2021-11-04
  Administered 2021-11-04: 0.6 mg via ORAL
  Filled 2021-11-04: qty 1

## 2021-11-04 MED ORDER — ACETAMINOPHEN 325 MG PO TABS: 650.0000 mg | ORAL_TABLET | ORAL | Status: DC | PRN

## 2021-11-04 MED ORDER — ROCURONIUM BROMIDE 10 MG/ML (PF) SYRINGE
PREFILLED_SYRINGE | INTRAVENOUS | Status: DC | PRN
Start: 2021-11-04 — End: 2021-11-04
  Administered 2021-11-04: 100 mg via INTRAVENOUS

## 2021-11-04 MED ORDER — DEXAMETHASONE SODIUM PHOSPHATE 10 MG/ML IJ SOLN
INTRAMUSCULAR | Status: DC | PRN
Start: 2021-11-04 — End: 2021-11-04
  Administered 2021-11-04: 5 mg via INTRAVENOUS

## 2021-11-04 MED ORDER — LIDOCAINE 2% (20 MG/ML) 5 ML SYRINGE
INTRAMUSCULAR | Status: DC | PRN
  Administered 2021-11-04: 100 mg via INTRAVENOUS

## 2021-11-04 MED ORDER — HEPARIN SODIUM (PORCINE) 1000 UNIT/ML IJ SOLN
INTRAMUSCULAR | Status: AC
  Filled 2021-11-04: qty 10

## 2021-11-04 MED ORDER — HEPARIN SODIUM (PORCINE) 1000 UNIT/ML IJ SOLN
INTRAMUSCULAR | Status: DC | PRN
  Administered 2021-11-04: 1000 [IU] via INTRAVENOUS

## 2021-11-04 MED ORDER — ISOPROTERENOL HCL 0.2 MG/ML IJ SOLN
INTRAMUSCULAR | Status: AC
  Filled 2021-11-04: qty 5

## 2021-11-04 MED ORDER — PROPOFOL 10 MG/ML IV BOLUS
INTRAVENOUS | Status: DC | PRN
  Administered 2021-11-04: 120 mg via INTRAVENOUS

## 2021-11-04 MED ORDER — PROTAMINE SULFATE 10 MG/ML IV SOLN
INTRAVENOUS | Status: DC | PRN
  Administered 2021-11-04: 20 mg via INTRAVENOUS
  Administered 2021-11-04: 10 mg via INTRAVENOUS

## 2021-11-04 MED ORDER — HEPARIN (PORCINE) IN NACL 2000-0.9 UNIT/L-% IV SOLN
INTRAVENOUS | Status: AC
  Filled 2021-11-04: qty 1000

## 2021-11-04 MED ORDER — PHENYLEPHRINE HCL-NACL 20-0.9 MG/250ML-% IV SOLN
INTRAVENOUS | Status: DC | PRN
  Administered 2021-11-04: 25 ug/min via INTRAVENOUS

## 2021-11-04 MED ORDER — ISOPROTERENOL HCL 0.2 MG/ML IJ SOLN
INTRAVENOUS | Status: DC | PRN
  Administered 2021-11-04: 4 ug/min via INTRAVENOUS

## 2021-11-04 MED ORDER — ONDANSETRON HCL 4 MG/2ML IJ SOLN: 4.0000 mg | Freq: Four times a day (QID) | INTRAMUSCULAR | Status: DC | PRN

## 2021-11-04 MED ORDER — SUGAMMADEX SODIUM 200 MG/2ML IV SOLN
INTRAVENOUS | Status: DC | PRN
  Administered 2021-11-04: 200 mg via INTRAVENOUS

## 2021-11-04 SURGICAL SUPPLY — 19 items
BLANKET WARM UNDERBOD FULL ACC (MISCELLANEOUS) ×2 IMPLANT
CATH 8FR REPROCESSED SOUNDSTAR (CATHETERS) ×2 IMPLANT
CATH 8FR SOUNDSTAR REPROCESSED (CATHETERS) IMPLANT
CATH OCTARAY 1.5 F (CATHETERS) ×1 IMPLANT
CATH S CIRCA THERM PROBE 10F (CATHETERS) ×1 IMPLANT
CATH SMTCH THERMOCOOL SF DF (CATHETERS) ×1 IMPLANT
CATH WEB BI DIR CSDF CRV REPRO (CATHETERS) ×1 IMPLANT
CLOSURE PERCLOSE PROSTYLE (VASCULAR PRODUCTS) ×3 IMPLANT
COVER SWIFTLINK CONNECTOR (BAG) ×2 IMPLANT
PACK EP LATEX FREE (CUSTOM PROCEDURE TRAY) ×1
PACK EP LF (CUSTOM PROCEDURE TRAY) ×1 IMPLANT
PAD DEFIB RADIO PHYSIO CONN (PAD) ×2 IMPLANT
PATCH CARTO3 (PAD) ×1 IMPLANT
SHEATH BAYLIS TRANSSEPTAL 98CM (NEEDLE) ×1 IMPLANT
SHEATH CARTO VIZIGO SM CVD (SHEATH) ×1 IMPLANT
SHEATH PINNACLE 8F 10CM (SHEATH) ×2 IMPLANT
SHEATH PINNACLE 9F 10CM (SHEATH) ×1 IMPLANT
SHEATH PROBE COVER 6X72 (BAG) ×1 IMPLANT
TUBING SMART ABLATE COOLFLOW (TUBING) ×1 IMPLANT

## 2021-11-04 NOTE — H&P (Signed)
Electrophysiology Office Note:     Date:  11/04/2021    ID:  Walter Leisure, Walter [name], DOB 1976-01-28, MRN [id number]   PCP:  Olive Bass, FNP        [facility] Cardiologist:  Donato Schultz, Walter [name]  [facility] Electrophysiologist:  Lanier Prude, Walter [name]    Referring Walter [name]: Dyann Kief, PA-C    Chief Complaint: Consult for Afib ablation   History of Present Illness:     Walter Leisure, Walter [name] is a 46 y.o. male who presents for an evaluation for atrial fibrillation ablation at the request of Jacolyn Reedy, PA-C. Their medical history includes persistent atrial fibrillation, typical atrial flutter, nonischemic cardiomyopathy, and hypothyroidism.   He was last seen by Jacolyn Reedy, PA-C on 06/15/2021 for preop clearance for total hip arthroplasty scheduled for 08/15/21. At that visit he reported 2-3 episodes of Afib daily. His EKG showed atrial flutter. He was started on Xarelto 20 mg, and referred to EP for consideration of ablation following hip surgery.   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 Anne Fu, who last saw him 05/03/2020. Previously seen by Dr. Johney Frame 01/23/2020 to discuss ablation.   Overall, he appears well. Usually he can feel when he is out of rhythm. He notices palpitations/flutters but is otherwise asymptomatic.    Initially he was diagnosed with Afib about 5-6 years ago, controlled with medication. He reports 1 previous cardioversion that was successful for a few months. Lately he believes he has been in Atrial fibrillation for several months.   For exercise, he uses a Peloton bike daily. Lately he is physically limited by his hip pain rather than shortness of breath.   He works as a Radiographer, therapeutic.   He denies any chest pain, or peripheral edema. No lightheadedness, headaches, syncope, orthopnea, or PND.   He sees an endocrinologist at [facility] for his thyroid.   Presents today for PVI and CTI ablation.    Objective      Past Medical History:  Diagnosis Date    Cardiomyopathy [facility])     Hypothyroidism     Persistent atrial fibrillation (HCC)     Typical atrial flutter [facility])             Past Surgical History:  Procedure Laterality Date   CARDIOVERSION N/A 10/01/2017    Procedure: CARDIOVERSION;  Surgeon: Lewayne Bunting, Walter [name];  Location: [facility] ENDOSCOPY;  Service: Cardiovascular;  Laterality: N/A;      Current Medications: Active Medications      Current Meds  Medication Sig   diltiazem (TIAZAC) 240 MG 24 hr capsule TAKE 1 CAPSULE BY MOUTH DAILY   levothyroxine (SYNTHROID) 100 MCG tablet Take 100 mcg by mouth daily before breakfast.   metoprolol succinate (TOPROL-XL) 50 MG 24 hr tablet TAKE 1 TABLET(50 MG) BY MOUTH DAILY WITH OR IMMEDIATELY FOLLOWING A MEAL   naproxen sodium (ALEVE) 220 MG tablet Take 220 mg by mouth.   Polyethyl Glycol-Propyl Glycol (SYSTANE OP) Place 4-5 drops into both eyes 2 (two) times daily.    rivaroxaban (XARELTO) 20 MG TABS tablet Take 1 tablet (20 mg total) by mouth daily with supper. (Patient taking differently: Take 20 mg by mouth in the morning.)   telmisartan (MICARDIS) 20 MG tablet TAKE 1 TABLET BY MOUTH DAILY        Allergies:   Patient has no known allergies.    Social History         Socioeconomic History   Marital status: Married  Spouse name: Not on file   Number of children: Not on file   Years of education: Not on file   Highest education level: Not on file  Occupational History   Not on file  Tobacco Use   Smoking status: Never   Smokeless tobacco: Never  Vaping Use   Vaping Use: Never used  Substance and Sexual Activity   Alcohol use: Yes      Comment: occ   Drug use: No   Sexual activity: Not on file  Other Topics Concern   Not on file  Social History Narrative    Lives in [place] alone.  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 There is no history of Heart failure.   ROS:   Please see the history of present illness.    (+) Palpitations All other systems reviewed and are negative.   EKGs/Labs/Other Studies Reviewed:     The following studies were reviewed today:   Echo 11/13/2019:  1. Overall, EF appears slightly improved 40-45%. Intermittent Afib with  RVR makes accurate assessment difficult. Would recommend to repeat study  after restoration of NSR.   2. Left ventricular ejection fraction, by estimation, is 40 to 45%. The  left ventricle has mildly decreased function. The left ventricle  demonstrates global hypokinesis. Left ventricular diastolic function could  not be evaluated.   3. Right ventricular systolic function is normal. The right ventricular  size is normal. Tricuspid regurgitation signal is inadequate for assessing  PA pressure.   4. The mitral valve is grossly normal. Mild mitral valve regurgitation.  No evidence of mitral stenosis.   5. The aortic valve is tricuspid. Aortic valve regurgitation is not  visualized. No aortic stenosis is present.   6. Aortic dilatation noted. There is mild dilatation of the aortic root  measuring 40 mm.   7. The inferior vena cava is normal in size with greater than 50%  respiratory variability, suggesting right atrial pressure of 3 mmHg.   Comparison(s): Changes from prior study are noted.    ETT 11/05/2017: Blood pressure demonstrated a normal response to exercise. No evidence of ischemia. No evidence of QRS widening on flecanide. No adverse arrhythmias. Normal sinus rhythm.   EKG:   EKG is personally reviewed.  08/02/2021: Atrial fibrillation.  Ventricular rate 89 bpm.     Recent Labs: No results  found for requested labs within last 8760 hours.    Recent Lipid Panel Labs (Brief)  No results found for: CHOL, TRIG, HDL, CHOLHDL, VLDL, LDLCALC, LDLDIRECT     Physical Exam:     VS:  BP 154/96   Pulse 47   Ht 5\' 11"  (1.803 m)   Wt 183 lb (83 kg)   SpO2 99%   BMI 25.52 kg/m         Wt Readings from Last 3 Encounters:  08/02/21 183 lb (83 kg)  06/15/21 192 lb (87.1 kg)  05/03/20 169 lb (76.7 kg)      GEN: Well nourished, well developed in no acute  distress HEENT: Normal NECK: No JVD; No carotid bruits LYMPHATICS: No lymphadenopathy CARDIAC: Irregularly irregular, no murmurs, rubs, gallops RESPIRATORY:  Clear to auscultation without rales, wheezing or rhonchi  ABDOMEN: Soft, non-tender, non-distended MUSCULOSKELETAL:  No edema; No deformity  SKIN: Warm and dry NEUROLOGIC:  Alert and oriented x 3 PSYCHIATRIC:  Normal affect          Assessment ASSESSMENT:     1. Persistent atrial fibrillation (HCC)   2. Typical atrial flutter (HCC)   3. Nonischemic cardiomyopathy (HCC)   4. Primary hypertension   5. Pre-op evaluation     PLAN:     In order of problems listed above:   #Persistent atrial fibrillation Symptomatic.  Also in the setting of a nonischemic cardiomyopathy.  A rhythm control strategy is indicated.  He takes Xarelto for stroke prophylaxis.  I discussed treatment options available to him including antiarrhythmic drugs and catheter ablation.  Given his young age, I think an ablation strategy is reasonable.  I discussed the catheter ablation procedure in detail with the patient include the risk, recovery and likelihood of success.  I discussed the possibility of needing future ablation procedures or antiarrhythmic drugs.  He would like to proceed with scheduling.  I will plan to get an echocardiogram before the ablation procedure.  He will also need a CT scan.  Risk, benefits, and alternatives to EP study and radiofrequency ablation for afib were also  discussed in detail today. These risks include but are not limited to stroke, bleeding, vascular damage, tamponade, perforation, damage to the esophagus, lungs, and other structures, pulmonary vein stenosis, worsening renal function, and death. The patient understands these risk and wishes to proceed.  We will therefore proceed with catheter ablation at the next available time.  Carto, ICE, anesthesia are requested for the procedure.  Will also obtain CT PV protocol prior to the procedure to exclude LAA thrombus and further evaluate atrial anatomy.   Ablation strategy will be PVI plus posterior wall plus CTI.  #Chronic systolic heart failure Nonischemic.  NYHA class I-II.  Warm and dry on exam.  Active.  I suspect there is an element of tachycardia mediated cardiomyopathy.  Rhythm control indicated as above.  I would like to get him off the calcium channel blocker after his ablation procedure.  We will then plan to transition his ARB to [facility] if the ejection fraction is still reduced.  Continue metoprolol.   Plan for PVI today.       Signed, Rossie Muskrat. Lalla Brothers, Walter [name], [facility], [facility] 11/04/2021  Electrophysiology [facility]

## 2021-11-04 NOTE — Transfer of Care (Signed)
Immediate Anesthesia Transfer of Care Note  Patient: Seward Grater, MD  Procedure(s) Performed: ATRIAL FIBRILLATION ABLATION  Patient Location: Cath Lab  Anesthesia Type:General  Level of Consciousness: awake, alert  and oriented  Airway & Oxygen Therapy: Patient Spontanous Breathing  Post-op Assessment: Report given to RN, Post -op Vital signs reviewed and stable and Patient moving all extremities X 4  Post vital signs: Reviewed and stable  Last Vitals:  Vitals Value Taken Time  BP 128/83 11/04/21 1235  Temp    Pulse 49 11/04/21 1236  Resp 12 11/04/21 1236  SpO2 99 % 11/04/21 1236  Vitals shown include unvalidated device data.  Last Pain:  Vitals:   11/04/21 0942  TempSrc: Oral  PainSc:          Complications: No notable events documented.

## 2021-11-04 NOTE — Discharge Instructions (Addendum)
Post procedure care instructions No driving for 4 days. No lifting over 5 lbs for 1 week. No vigorous or sexual activity for 1 week. You may return to work/your usual activities on 11/12/21. Keep procedure site clean & dry. If you notice increased pain, swelling, bleeding or pus, call/return!  You may shower after 24 hours, but no soaking in baths/hot tubs/pools for 1 week.    You have an appointment set up with the Atrial Fibrillation Clinic.  Multiple studies have shown that being followed by a dedicated atrial fibrillation clinic in addition to the standard care you receive from your other physicians improves health. We believe that enrollment in the atrial fibrillation clinic will allow us to better care for you.   The phone number to the Atrial Fibrillation Clinic is [phone]. The clinic is staffed Monday through Friday from 8:30am to 5pm.  Parking Directions: The clinic is located in the Heart and Vascular Building connected to [facility]. 1)From [address] turn on to [address] and go to the 3rd entrance  (Heart and Vascular entrance) on the right. 2)Look to the right for Heart &Vascular Parking Garage. 3)A code for the entrance is required, for July is 1404.   4)Take the elevators to the 1st floor. Registration is in the room with the glass walls at the end of the hallway.  If you have any trouble parking or locating the clinic, please don't hesitate to call [phone].   Cardiac Ablation, Care After  This sheet gives you information about how to care for yourself after your procedure. Your health care provider may also give you more specific instructions. If you have problems or questions, contact your health care provider. What can I expect after the procedure? After the procedure, it is common to have: Bruising around your puncture site. Tenderness around your puncture site. Skipped heartbeats. Tiredness (fatigue).  Follow these instructions at  home: Puncture site care  Follow instructions from your health care provider about how to take care of your puncture site. Make sure you: If present, leave stitches (sutures), skin glue, or adhesive strips in place. These skin closures may need to stay in place for up to 2 weeks. If adhesive strip edges start to loosen and curl up, you may trim the loose edges. Do not remove adhesive strips completely unless your health care provider tells you to do that. If a large square bandage is present, this may be removed 24 hours after surgery.  Check your puncture site every day for signs of infection. Check for: Redness, swelling, or pain. Fluid or blood. If your puncture site starts to bleed, lie down on your back, apply firm pressure to the area, and contact your health care provider. Warmth. Pus or a bad smell. A pea or small marble sized lump at the site is normal and can take up to three months to resolve.  Driving Do not drive for at least 4 days after your procedure or however long your health care provider recommends. (Do not resume driving if you have previously been instructed not to drive for other health reasons.) Do not drive or use heavy machinery while taking prescription pain medicine. Activity Avoid activities that take a lot of effort for at least 7 days after your procedure. Do not lift anything that is heavier than 5 lb (4.5 kg) for one week.  No sexual activity for 1 week.  Return to your normal activities as told by your health care provider. Ask your health   care provider what activities are safe for you. General instructions Take over-the-counter and prescription medicines only as told by your health care provider. Do not use any products that contain nicotine or tobacco, such as cigarettes and e-cigarettes. If you need help quitting, ask your health care provider. You may shower after 24 hours, but Do not take baths, swim, or use a hot tub for 1 week.  Do not drink alcohol for  24 hours after your procedure. Keep all follow-up visits as told by your health care provider. This is important. Contact a health care provider if: You have redness, mild swelling, or pain around your puncture site. You have fluid or blood coming from your puncture site that stops after applying firm pressure to the area. Your puncture site feels warm to the touch. You have pus or a bad smell coming from your puncture site. You have a fever. You have chest pain or discomfort that spreads to your neck, jaw, or arm. You are sweating a lot. You feel nauseous. You have a fast or irregular heartbeat. You have shortness of breath. You are dizzy or light-headed and feel the need to lie down. You have pain or numbness in the arm or leg closest to your puncture site. Get help right away if: Your puncture site suddenly swells. Your puncture site is bleeding and the bleeding does not stop after applying firm pressure to the area. These symptoms may represent a serious problem that is an emergency. Do not wait to see if the symptoms will go away. Get medical help right away. Call your local emergency services (911 in the [place] Do not drive yourself to the hospital. Summary After the procedure, it is normal to have bruising and tenderness at the puncture site in your groin, neck, or forearm. Check your puncture site every day for signs of infection. Get help right away if your puncture site is bleeding and the bleeding does not stop after applying firm pressure to the area. This is a medical emergency. This information is not intended to replace advice given to you by your health care provider. Make sure you discuss any questions you have with your health care provider.   

## 2021-11-04 NOTE — Anesthesia Procedure Notes (Signed)
Procedure Name: Intubation Date/Time: 11/04/2021 10:31 AM  Performed by: Harden Mo, CRNAPre-anesthesia Checklist: Patient identified, Emergency Drugs available, Suction available and Patient being monitored Patient Re-evaluated:Patient Re-evaluated prior to induction Oxygen Delivery Method: Circle System Utilized Preoxygenation: Pre-oxygenation with 100% oxygen Induction Type: IV induction Ventilation: Mask ventilation without difficulty Laryngoscope Size: Miller and 2 Grade View: Grade I Tube type: Oral Tube size: 7.5 mm Number of attempts: 1 Airway Equipment and Method: Stylet and Oral airway Placement Confirmation: ETT inserted through vocal cords under direct vision, positive ETCO2 and breath sounds checked- equal and bilateral Secured at: 23 cm Tube secured with: Tape Dental Injury: Teeth and Oropharynx as per pre-operative assessment

## 2021-11-06 NOTE — Anesthesia Postprocedure Evaluation (Signed)
Anesthesia Post Note  Patient: Walter Riggs, MD  Procedure(s) Performed: ATRIAL FIBRILLATION ABLATION     Patient location during evaluation: Cath Lab Anesthesia Type: General Level of consciousness: sedated Pain management: pain level controlled Vital Signs Assessment: post-procedure vital signs reviewed and stable Respiratory status: spontaneous breathing and respiratory function stable Cardiovascular status: stable Postop Assessment: no apparent nausea or vomiting Anesthetic complications: no   No notable events documented.  Last Vitals:  Vitals:   11/04/21 1601 11/04/21 1700  BP: (!) 142/97 (!) 138/100  Pulse: (!) 53 (!) 54  Resp: 15 15  Temp:    SpO2: 98% 99%    Last Pain:  Vitals:   11/04/21 1305  TempSrc: Temporal  PainSc: 0-No pain                 Caliana Spires DANIEL

## 2021-11-07 ENCOUNTER — Encounter (HOSPITAL_COMMUNITY): Payer: Self-pay | Admitting: Cardiology

## 2021-11-15 ENCOUNTER — Other Ambulatory Visit: Payer: Self-pay

## 2021-11-15 MED ORDER — RIVAROXABAN 20 MG PO TABS: 20.0000 mg | ORAL_TABLET | Freq: Every day | ORAL | 3 refills | Status: DC

## 2021-11-15 NOTE — Telephone Encounter (Signed)
Prescription refill request for Xarelto received.  Indication:Afib Last office visit:3/23 Weight:81.6 kg Age:46 Scr:1.1 CrCl:94.79  Prescription refilled

## 2021-12-01 ENCOUNTER — Ambulatory Visit (HOSPITAL_COMMUNITY)
Admit: 2021-12-01 | Discharge: 2021-12-01 | Disposition: A | Discharge status: Home / Self Care | Payer: Managed Care, Other (non HMO) | Attending: Nurse Practitioner | Admitting: Nurse Practitioner

## 2021-12-01 VITALS — BP 128/90 | HR 57 | Ht 71.0 in | Wt 188.6 lb

## 2021-12-01 DIAGNOSIS — D6869 Other thrombophilia: Secondary | ICD-10-CM

## 2021-12-01 DIAGNOSIS — I10 Essential (primary) hypertension: Secondary | ICD-10-CM | POA: Insufficient documentation

## 2021-12-01 DIAGNOSIS — I4891 Unspecified atrial fibrillation: Secondary | ICD-10-CM

## 2021-12-01 DIAGNOSIS — I4819 Other persistent atrial fibrillation: Secondary | ICD-10-CM

## 2021-12-01 DIAGNOSIS — R001 Bradycardia, unspecified: Secondary | ICD-10-CM | POA: Insufficient documentation

## 2021-12-01 DIAGNOSIS — Z7901 Long term (current) use of anticoagulants: Secondary | ICD-10-CM | POA: Insufficient documentation

## 2021-12-01 DIAGNOSIS — I480 Paroxysmal atrial fibrillation: Secondary | ICD-10-CM | POA: Diagnosis not present

## 2021-12-01 DIAGNOSIS — Z79899 Other long term (current) drug therapy: Secondary | ICD-10-CM | POA: Diagnosis not present

## 2021-12-01 NOTE — Progress Notes (Signed)
Primary Care Physician: Olive Bass, FNP Referring Physician: Dr. Pleas Patricia, MD is a 46 y.o. male with a h/o  afib that is in the afib clinic for f/u afib ablation. He is in SR. He denies any afib since procedure, has felt a few flutters. . No c/o of swallowing or groin issues. He continues  on amiodarone 200 mg daily. On xarelto 20 mg daily for a CHA2DS2VASc score of  1.   Today, he denies symptoms of palpitations, chest pain, shortness of breath, orthopnea, PND, lower extremity edema, dizziness, presyncope, syncope, or neurologic sequela. The patient is tolerating medications without difficulties and is otherwise without complaint today.   Past Medical History:  Diagnosis Date   Arthritis    Cardiomyopathy (HCC)    Dysrhythmia    Hypothyroidism    Persistent atrial fibrillation (HCC)    Typical atrial flutter (HCC)    Past Surgical History:  Procedure Laterality Date   ATRIAL FIBRILLATION ABLATION N/A 11/04/2021   Procedure: ATRIAL FIBRILLATION ABLATION;  Surgeon: Lanier Prude, MD;  [facility];  Service: Cardiovascular;  Laterality: N/A;   CARDIOVERSION N/A 10/01/2017   Procedure: CARDIOVERSION;  Surgeon: Lewayne Bunting, MD;  Location: [facility] ENDOSCOPY;  Service: Cardiovascular;  Laterality: N/A;   CARDIOVERSION N/A 09/07/2021   Procedure: CARDIOVERSION;  Surgeon: Meriam Sprague, MD;  Location: [facility] ENDOSCOPY;  Service: Cardiovascular;  Laterality: N/A;   NO PAST SURGERIES     ORIF FEMUR FRACTURE Left 09/05/2021   Procedure: OPEN REDUCTION INTERNAL FIXATION (ORIF) LEFT PERIPROSTHETIC FRACTURE, POSSIBLE FEMUR REVISION;  Surgeon: Ollen Gross, MD;  Location: [facility] ORS;  Service: Orthopedics;  Laterality: Left;  ZIMMER CABLES AND DEPUY   TEE WITHOUT CARDIOVERSION N/A 09/07/2021   Procedure: TRANSESOPHAGEAL ECHOCARDIOGRAM (TEE);  Surgeon: Meriam Sprague, MD;  Location: [facility] ENDOSCOPY;  Service: Cardiovascular;  Laterality: N/A;    TOTAL HIP ARTHROPLASTY Left 08/15/2021   Procedure: TOTAL HIP ARTHROPLASTY ANTERIOR APPROACH;  Surgeon: Ollen Gross, MD;  Location: [facility] ORS;  Service: Orthopedics;  Laterality: Left;    Current Outpatient Medications  Medication Sig Dispense Refill   amiodarone (PACERONE) 200 MG tablet Take 1 tablet (200 mg total) by mouth daily. 90 tablet 0   colchicine 0.6 MG tablet Take 1 tablet (0.6 mg total) by mouth 2 (two) times daily for 5 days. 10 tablet 0   diltiazem (TIAZAC) 240 MG 24 hr capsule TAKE 1 CAPSULE BY MOUTH DAILY (Patient taking differently: Take 240 mg by mouth daily.) 90 capsule 3   HYDROcodone-acetaminophen (NORCO/VICODIN) 5-325 MG tablet Take 1-2 tablets by mouth every 6 (six) hours as needed for severe pain. 42 tablet 0   levothyroxine (SYNTHROID) 112 MCG tablet Take 1 tablet (112 mcg total) by mouth daily at 6 (six) AM. 30 tablet 0   methocarbamol (ROBAXIN) 500 MG tablet Take 1 tablet (500 mg total) by mouth every 6 (six) hours as needed for muscle spasms. 40 tablet 0   metoprolol succinate (TOPROL-XL) 50 MG 24 hr tablet TAKE 1 TABLET(50 MG) BY MOUTH DAILY WITH OR IMMEDIATELY FOLLOWING A MEAL (Patient taking differently: Take 50 mg by mouth daily.) 90 tablet 3   pantoprazole (PROTONIX) 40 MG tablet Take 1 tablet (40 mg total) by mouth daily. 45 tablet 0   Polyethyl Glycol-Propyl Glycol (SYSTANE OP) Place 4-5 drops into both eyes 2 (two) times daily.      rivaroxaban (XARELTO) 20 MG TABS tablet Take 1 tablet (20 mg total) by  mouth daily with supper. 90 tablet 3   telmisartan (MICARDIS) 20 MG tablet TAKE 1 TABLET BY MOUTH DAILY (Patient taking differently: Take 20 mg by mouth daily.) 90 tablet 3   traMADol (ULTRAM) 50 MG tablet Take 1-2 tablets (50-100 mg total) by mouth every 6 (six) hours as needed for moderate pain. (Patient not taking: Reported on 09/03/2021) 40 tablet 0   No current facility-administered medications for this encounter.    No Known Allergies  Social History    Socioeconomic History   Marital status: Single    Spouse name: Not on file   Number of children: Not on file   Years of education: Not on file   Highest education level: Not on file  Occupational History   Not on file  Tobacco Use   Smoking status: Never   Smokeless tobacco: Never  Vaping Use   Vaping Use: Never used  Substance and Sexual Activity   Alcohol use: Not Currently    Comment: occ   Drug use: No   Sexual activity: Not on file  Other Topics Concern   Not on file  Social History Narrative   Lives in [place] alone.  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, working at [employer]      Social Determinants of Health   Financial Resource Strain: Low Risk  (09/07/2021)   Overall Financial Resource Strain (CARDIA)    Difficulty of Paying Living Expenses: Not hard at all  Food Insecurity: No Food Insecurity (09/07/2021)   Hunger Vital Sign    Worried About Running Out of Food in the Last Year: Never true    Ran Out of Food in the Last Year: Never true  Transportation Needs: No Transportation Needs (09/07/2021)   PRAPARE - Administrator, Civil Service (Medical): No    Lack of Transportation (Non-Medical): No  Physical Activity: Insufficiently Active (09/07/2021)   Exercise Vital Sign    Days of Exercise per Week: 1 day    Minutes of Exercise per Session: 30 min  Stress: No Stress Concern Present (09/07/2021)   [employer] of Occupational Health - Occupational Stress Questionnaire    Feeling of Stress : Only a little  Social Connections: Unknown (09/07/2021)   Social Connection and Isolation Panel [NHANES]    Frequency of Communication with Friends and Family: More than three times a week    Frequency of Social Gatherings with Friends and Family: More than three times a week    Attends Religious Services: More than 4 times per year    Active Member of [employer] or Organizations: Yes    Attends Banker Meetings: 1 to 4  times per year    Marital Status: Not on file  Intimate Partner Violence: Not At Risk (09/07/2021)   Humiliation, Afraid, Rape, and Kick questionnaire    Fear of Current or Ex-Partner: No    Emotionally Abused: No    Physically Abused: No    Sexually Abused: No    Family History  Problem Relation Age of Onset   Atrial fibrillation Maternal Grandmother    Heart failure Neg Hx     ROS- All systems are reviewed and negative except as per the HPI above  Physical Exam: Vitals:   12/01/21 0833  Weight: 85.5 kg  Height: 5\' 11"  (1.803 m)   Wt Readings from Last 3 Encounters:  12/01/21 85.5 kg  11/04/21 81.6 kg  09/07/21 81.1 kg    Labs:  Lab Results  Component Value Date   NA 139 11/04/2021   K 4.0 11/04/2021   CL 110 11/04/2021   CO2 26 11/04/2021   GLUCOSE 97 11/04/2021   BUN 9 11/04/2021   CREATININE 1.18 11/04/2021   CALCIUM 8.9 11/04/2021   MG 2.2 09/07/2021   Lab Results  Component Value Date   INR 1.4 (H) 09/06/2021   No results found for: "CHOL", "HDL", "LDLCALC", "TRIG"   GEN- The patient is well appearing, alert and oriented x 3 today.   Head- normocephalic, atraumatic Eyes-  Sclera clear, conjunctiva pink Ears- hearing intact Oropharynx- clear Neck- supple, no JVP Lymph- no cervical lymphadenopathy Lungs- Clear to ausculation bilaterally, normal work of breathing Heart- Regular rate and rhythm, no murmurs, rubs or gallops, PMI not laterally displaced GI- soft, NT, ND, + BS Extremities- no clubbing, cyanosis, or edema MS- no significant deformity or atrophy Skin- no rash or lesion Psych- euthymic mood, full affect Neuro- strength and sensation are intact  EKG-Vent. rate 57 BPM PR interval 166 ms QRS duration 90 ms QT/QTcB 460/447 ms P-R-T axes 26 72 37 Sinus bradycardia Cannot rule out Anterior infarct , age undetermined Abnormal ECG When compared with ECG of 04-Nov-2021 12:41, PREVIOUS ECG IS PRESENT    Assessment and Plan:  1.  Afib S/p ablation  In SR  Doing well post op Continue amiodarone 200 mg daily Continue metoprolol succinate 50 mg daily   2. CHA2DS2VASc  score of 1 Continue xarelto 20 mg daily without interruption   3. HTN Stable   F/u with Dr. Lalla Brothers 9/11   Lupita Leash [facility] [address] [place], [place] [address] [phone]

## 2021-12-06 ENCOUNTER — Ambulatory Visit (HOSPITAL_COMMUNITY): Payer: Managed Care, Other (non HMO) | Admitting: Nurse Practitioner

## 2021-12-08 ENCOUNTER — Other Ambulatory Visit: Payer: Self-pay

## 2021-12-08 MED ORDER — AMIODARONE HCL 200 MG PO TABS: 200.0000 mg | ORAL_TABLET | Freq: Every day | ORAL | 2 refills | Status: DC

## 2021-12-08 NOTE — Telephone Encounter (Signed)
Pt's medication was sent to pt's pharmacy as requested. Confirmation received.  °

## 2022-02-05 NOTE — Progress Notes (Unsigned)
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  He had an atrial fibrillation ablation on November 04, 2021.  During the procedure the veins, posterior wall and CTI were ablated.  He saw Lupita Leash in the A-fib clinic in follow-up on December 01, 2021.  He was doing well at that appointment and maintaining sinus rhythm.  He has been on Xarelto for stroke prophylaxis.       Past Medical History:  Diagnosis Date   Arthritis    Cardiomyopathy (HCC)    Dysrhythmia    Hypothyroidism    Persistent atrial fibrillation (HCC)    Typical atrial flutter (HCC)     Past Surgical History:  Procedure Laterality Date   ATRIAL FIBRILLATION ABLATION N/A 11/04/2021   Procedure: ATRIAL FIBRILLATION ABLATION;  Surgeon: Lanier Prude, MD;  Location: [facility] CV [facility];  Service: Cardiovascular;  Laterality: N/A;   CARDIOVERSION N/A 10/01/2017   Procedure: CARDIOVERSION;  Surgeon: Lewayne Bunting, MD;  Location: [facility] ENDOSCOPY;  Service: Cardiovascular;  Laterality: N/A;   CARDIOVERSION N/A 09/07/2021   Procedure: CARDIOVERSION;  Surgeon: Meriam Sprague, MD;  Location: [facility] ENDOSCOPY;  Service: Cardiovascular;  Laterality: N/A;   NO PAST SURGERIES     ORIF FEMUR FRACTURE Left 09/05/2021   Procedure: OPEN REDUCTION INTERNAL FIXATION (ORIF) LEFT PERIPROSTHETIC FRACTURE, POSSIBLE FEMUR REVISION;  Surgeon: Ollen Gross, MD;  Location: [facility] ORS;  Service: Orthopedics;  Laterality: Left;  ZIMMER CABLES AND DEPUY   TEE WITHOUT CARDIOVERSION N/A 09/07/2021   Procedure: TRANSESOPHAGEAL ECHOCARDIOGRAM (TEE);  Surgeon: Meriam Sprague, MD;  Location: [facility] ENDOSCOPY;  Service: Cardiovascular;  Laterality:  N/A;   TOTAL HIP ARTHROPLASTY Left 08/15/2021   Procedure: TOTAL HIP ARTHROPLASTY ANTERIOR APPROACH;  Surgeon: Ollen Gross, MD;  Location: [facility] OR[facility];  Service: Orthopedics;  Laterality: Left;    Current Medications: No outpatient medications have been marked as taking for the 02/06/22 encounter (Appointment) with Lanier Prude, MD.     Allergies:   Patient has no known allergies.   Social History   Socioeconomic History   Marital status: Single    Spouse name: Not on file   Number of children: Not on file   Years of education: Not on file   Highest education level: Not on file  Occupational History   Not on file  Tobacco Use   Smoking status: Never   Smokeless tobacco: Never  Vaping Use   Vaping Use: Never used  Substance and Sexual Activity   Alcohol use: Not Currently    Comment: occ   Drug use: No   Sexual activity: Not on file  Other Topics Concern   Not on file  Social History Narrative   Lives in [place] alone.  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, working at [employer]      Social Determinants of Health   Financial Resource Strain: Low Risk  (09/07/2021)   Overall Financial Resource Strain (CARDIA)    Difficulty of Paying Living Expenses: Not hard at all  Food Insecurity: No Food Insecurity (09/07/2021)   Hunger Vital Sign    Worried About  Running Out of Food in the Last Year: Never true    Ran Out of Food in the Last Year: Never true  Transportation Needs: No Transportation Needs (09/07/2021)   PRAPARE - Administrator, Civil Service (Medical): No    Lack of Transportation (Non-Medical): No  Physical Activity: Insufficiently Active (09/07/2021)   Exercise Vital Sign    Days of Exercise per Week: 1 day    Minutes of Exercise per Session: 30 min  Stress: No Stress Concern Present (09/07/2021)   [employer] of Occupational Health - Occupational Stress Questionnaire    Feeling of Stress : Only a little  Social  Connections: Unknown (09/07/2021)   Social Connection and Isolation Panel [NHANES]    Frequency of Communication with Friends and Family: More than three times a week    Frequency of Social Gatherings with Friends and Family: More than three times a week    Attends Religious Services: More than 4 times per year    Active Member of [employer] or Organizations: Yes    Attends Banker Meetings: 1 to 4 times per year    Marital Status: Not on file     Family History: The patient's family history includes Atrial fibrillation in his maternal grandmother. There is no history of Heart failure.  ROS:   Please see the history of present illness.    All other systems reviewed and are negative.  EKGs/Labs/Other Studies Reviewed:    The following studies were reviewed today:   EKG:  The ekg ordered today demonstrates ***  Recent Labs: 09/04/2021: ALT 14; TSH 5.133 09/07/2021: Magnesium 2.2 11/04/2021: BUN 9; Creatinine, Ser 1.18; Hemoglobin 11.6; Platelets 291; Potassium 4.0; Sodium 139  Recent Lipid Panel No results found for: "CHOL", "TRIG", "HDL", "CHOLHDL", "VLDL", "LDLCALC", "LDLDIRECT"  Physical Exam:    VS:  There were no vitals taken for this visit.    Wt Readings from Last 3 Encounters:  12/01/21 188 lb 9.6 oz (85.5 kg)  11/04/21 180 lb (81.6 kg)  09/07/21 178 lb 12.8 oz (81.1 kg)     GEN: *** Well nourished, well developed in no acute distress HEENT: Normal NECK: No JVD; No carotid bruits LYMPHATICS: No lymphadenopathy CARDIAC: ***RRR, no murmurs, rubs, gallops RESPIRATORY:  Clear to auscultation without rales, wheezing or rhonchi  ABDOMEN: Soft, non-tender, non-distended MUSCULOSKELETAL:  No edema; No deformity  SKIN: Warm and dry NEUROLOGIC:  Alert and oriented x 3 PSYCHIATRIC:  Normal affect        ASSESSMENT:    1. Persistent atrial fibrillation (HCC)   2. Typical atrial flutter (HCC)   3. Primary hypertension    PLAN:    In order of problems listed  above:  #Persistent atrial fibrillation and flutter Doing well after his ablation in June.  On Xarelto for stroke prophylaxis.  Consider aspirin 81 mg by mouth once daily with ongoing rhythm surveillance.  #Hypertension *** goal today.  Recommend checking blood pressures 1-2 times per week at home and recording the values.  Recommend bringing these recordings to the primary care physician.   Total time spent with patient today *** minutes. This includes reviewing records, evaluating the patient and coordinating care.   Medication Adjustments/Labs and Tests Ordered: Current medicines are reviewed at length with the patient today.  Concerns regarding medicines are outlined above.  No orders of the defined types were placed in this encounter.  No orders of the defined types were placed in this encounter.    Signed,  Lars Mage, MD, [facility], [facility] 02/05/2022 8:30 PM    Electrophysiology [facility]

## 2022-02-06 ENCOUNTER — Encounter: Payer: Self-pay | Admitting: Cardiology

## 2022-02-06 ENCOUNTER — Ambulatory Visit: Payer: Managed Care, Other (non HMO) | Attending: Cardiology | Admitting: Cardiology

## 2022-02-06 VITALS — BP 136/82 | HR 48 | Ht 71.0 in | Wt 192.0 lb

## 2022-02-06 DIAGNOSIS — I4819 Other persistent atrial fibrillation: Secondary | ICD-10-CM

## 2022-02-06 DIAGNOSIS — I483 Typical atrial flutter: Secondary | ICD-10-CM | POA: Diagnosis not present

## 2022-02-06 DIAGNOSIS — I10 Essential (primary) hypertension: Secondary | ICD-10-CM

## 2022-02-06 NOTE — Patient Instructions (Signed)
Medication Instructions:  Stop Diltiazem  Stop Amiodarone *If you need a refill on your cardiac medications before your next appointment, please call your pharmacy*   Lab Work: none If you have labs (blood work) drawn today and your tests are completely normal, you will receive your results only by: MyChart Message (if you have MyChart) OR A paper copy in the mail If you have any lab test that is abnormal or we need to change your treatment, we will call you to review the results.   Testing/Procedures: none   Follow-Up: At [facility], you and your health needs are our priority.  As part of our continuing mission to provide you with exceptional heart care, we have created designated Provider Care Teams.  These Care Teams include your primary Cardiologist (physician) and Advanced Practice Providers (APPs -  Physician Assistants and Nurse Practitioners) who all work together to provide you with the care you need, when you need it.  We recommend signing up for the patient portal called "MyChart".  Sign up information is provided on this After Visit Summary.  MyChart is used to connect with patients for Virtual Visits (Telemedicine).  Patients are able to view lab/test results, encounter notes, upcoming appointments, etc.  Non-urgent messages can be sent to your provider as well.   To learn more about what you can do with MyChart, go to [email or web address].    Your next appointment:   1 year(s)  The format for your next appointment:   In Person  Provider:   You will see one of the following Advanced Practice Providers on your designated Care Team:   Francis Dowse, [place] Casimiro Needle "Mardelle Matte" Lanna Poche, [place]      Other Instructions None   Important Information About Sugar

## 2022-02-06 NOTE — Progress Notes (Signed)
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 He had an atrial fibrillation ablation on November 04, 2021.  During the procedure the veins, posterior wall and CTI were ablated.  He saw Lupita Leash in the A-fib clinic in follow-up on December 01, 2021.  He was doing well at that appointment and maintaining sinus rhythm.   He has been on Xarelto for stroke prophylaxis.  Today, he is feeling well overall. Every now and then he believes he may be feeling a flutter. He is unsure if he is being paranoid, but agrees he is likely more in tune with feeling his palpitations. Usually he is able to tell when he is in atrial fibrillation without a monitoring device such as Kardia or Apple watch.  Lately he has been exercising frequently without any shortness of breath or anginal symptoms.  He is compliant with Xarelto. He denies any significant bruising or bleeding issues.  Typically he does not check blood pressures at home.  He denies any chest pain, or peripheral edema. No lightheadedness, headaches, syncope, orthopnea, or PND.      Past Medical History:  Diagnosis Date   Arthritis    Cardiomyopathy (HCC)    Dysrhythmia    Hypothyroidism    Persistent atrial fibrillation (HCC)    Typical atrial flutter (HCC)     Past Surgical History:  Procedure Laterality Date   ATRIAL FIBRILLATION ABLATION N/A 11/04/2021   Procedure: ATRIAL FIBRILLATION ABLATION;  Surgeon: Lanier Prude, MD;  Location: [facility] INVASIVE CV LAB;  Service: Cardiovascular;  Laterality: N/A;   CARDIOVERSION N/A 10/01/2017   Procedure: CARDIOVERSION;  Surgeon: Lewayne Bunting, MD;  Location: [facility] ENDOSCOPY;  Service:  Cardiovascular;  Laterality: N/A;   CARDIOVERSION N/A 09/07/2021   Procedure: CARDIOVERSION;  Surgeon: Meriam Sprague, MD;  Location: [facility] ENDOSCOPY;  Service: Cardiovascular;  Laterality: N/A;   NO PAST SURGERIES     ORIF FEMUR FRACTURE Left 09/05/2021   Procedure: OPEN REDUCTION INTERNAL FIXATION (ORIF) LEFT PERIPROSTHETIC FRACTURE, POSSIBLE FEMUR REVISION;  Surgeon: Ollen Gross, MD;  Location: [facility] ORS;  Service: Orthopedics;  Laterality: Left;  ZIMMER CABLES AND DEPUY   TEE WITHOUT CARDIOVERSION N/A 09/07/2021   Procedure: TRANSESOPHAGEAL ECHOCARDIOGRAM (TEE);  Surgeon: Meriam Sprague, MD;  Location: [facility] ENDOSCOPY;  Service: Cardiovascular;  Laterality: N/A;   TOTAL HIP ARTHROPLASTY Left 08/15/2021   Procedure: TOTAL HIP ARTHROPLASTY ANTERIOR APPROACH;  Surgeon: Ollen Gross, MD;  Location: [facility] ORS;  Service: Orthopedics;  Laterality: Left;    Current Medications: Current Meds  Medication Sig   amiodarone (PACERONE) 200 MG tablet Take 1 tablet (200 mg total) by mouth daily.   diltiazem (TIAZAC) 240 MG 24 hr capsule TAKE 1 CAPSULE BY MOUTH DAILY (Patient taking differently: Take 240 mg by mouth daily.)   Ferrous Sulfate (IRON PO) Take by mouth. 1 qd   levothyroxine (SYNTHROID) 100 MCG tablet Take 1 tablet in AM on empty stomach once a day for thyroid (name brand only DAW)   metoprolol succinate (TOPROL-XL) 50 MG 24 hr tablet TAKE 1 TABLET(50 MG) BY MOUTH DAILY WITH OR IMMEDIATELY FOLLOWING A MEAL (Patient taking differently: Take 50  mg by mouth daily.)   Multiple Vitamin (MULTIVITAMIN) capsule Take 1 capsule by mouth daily.   Polyethyl Glycol-Propyl Glycol (SYSTANE OP) Place 4-5 drops into both eyes 2 (two) times daily.    rivaroxaban (XARELTO) 20 MG TABS tablet Take 1 tablet (20 mg total) by mouth daily with supper.   telmisartan (MICARDIS) 20 MG tablet TAKE 1 TABLET BY MOUTH DAILY (Patient taking differently: Take 20 mg by mouth daily.)     Allergies:   Patient has no known  allergies.   Social History   Socioeconomic History   Marital status: Single    Spouse name: Not on file   Number of children: Not on file   Years of education: Not on file   Highest education level: Not on file  Occupational History   Not on file  Tobacco Use   Smoking status: Never   Smokeless tobacco: Never  Vaping Use   Vaping Use: Never used  Substance and Sexual Activity   Alcohol use: Not Currently    Comment: occ   Drug use: No   Sexual activity: Not on file  Other Topics Concern   Not on file  Social History Narrative   Lives in [place] alone.  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, working at [employer]      Social Determinants of Health   Financial Resource Strain: Low Risk  (09/07/2021)   Overall Financial Resource Strain (CARDIA)    Difficulty of Paying Living Expenses: Not hard at all  Food Insecurity: No Food Insecurity (09/07/2021)   Hunger Vital Sign    Worried About Running Out of Food in the Last Year: Never true    Ran Out of Food in the Last Year: Never true  Transportation Needs: No Transportation Needs (09/07/2021)   PRAPARE - Administrator, Civil Service (Medical): No    Lack of Transportation (Non-Medical): No  Physical Activity: Insufficiently Active (09/07/2021)   Exercise Vital Sign    Days of Exercise per Week: 1 day    Minutes of Exercise per Session: 30 min  Stress: No Stress Concern Present (09/07/2021)   [employer] of Occupational Health - Occupational Stress Questionnaire    Feeling of Stress : Only a little  Social Connections: Unknown (09/07/2021)   Social Connection and Isolation Panel [NHANES]    Frequency of Communication with Friends and Family: More than three times a week    Frequency of Social Gatherings with Friends and Family: More than three times a week    Attends Religious Services: More than 4 times per year    Active Member of [employer] or Organizations: Yes    Attends Tax inspector Meetings: 1 to 4 times per year    Marital Status: Not on file     Family History: The patient's family history includes Atrial fibrillation in his maternal grandmother. There is no history of Heart failure.  ROS:   Please see the history of present illness.    (+) Palpitations All other systems reviewed and are negative.  EKGs/Labs/Other Studies Reviewed:    The following studies were reviewed today:  11/04/2021  Atrial Fibrillation Ablation: CONCLUSIONS: 1. Successful PVI 2. Successful ablation/isolation of the posterior wall 3. Successful ablation of the cavotricuspid isthmus for typical atrial flutter 4. Intracardiac echo reveals dilated LA, left sided common PV ostium and a trivial pericardial effusion 5. No early apparent complications. 6. Colchicine 0.6mg  PO BID x 5 days 7.  Protonix 40mg  PO daily x 45 days  09/07/2021  TEE:  1. Left ventricular ejection fraction, by estimation, is 45 to 50%. The  left ventricle has mildly decreased function.   2. Right ventricular systolic function is mildly reduced. The right  ventricular size is normal.   3. Left atrial size was moderately dilated. No left atrial/left atrial  appendage thrombus was detected. The LAA emptying velocity was 40 cm/s.   4. The mitral valve is normal in structure. Trivial mitral valve  regurgitation. No evidence of mitral stenosis.   5. The aortic valve is tricuspid. Aortic valve regurgitation is trivial.  No aortic stenosis is present.   6. Aortic dilatation noted. There is mild dilatation of the aortic root,  measuring 42 mm.   7. Following TEE, the patient underwent DCCV with 150J x1 with return to  NSR.    EKG:  EKG is personally reviewed.  02/06/2022: Sinus rhythm  Recent Labs: 09/04/2021: ALT 14; TSH 5.133 09/07/2021: Magnesium 2.2 11/04/2021: BUN 9; Creatinine, Ser 1.18; Hemoglobin 11.6; Platelets 291; Potassium 4.0; Sodium 139   Recent Lipid Panel No results found for: "CHOL",  "TRIG", "HDL", "CHOLHDL", "VLDL", "LDLCALC", "LDLDIRECT"  Physical Exam:    VS:  BP 136/82   Pulse (!) 48   Ht 5\' 11"  (1.803 m)   Wt 192 lb (87.1 kg)   SpO2 98%   BMI 26.78 kg/m     Wt Readings from Last 3 Encounters:  02/06/22 192 lb (87.1 kg)  12/01/21 188 lb 9.6 oz (85.5 kg)  11/04/21 180 lb (81.6 kg)     GEN: Well nourished, well developed in no acute distress HEENT: Normal NECK: No JVD; No carotid bruits LYMPHATICS: No lymphadenopathy CARDIAC: RRR, no murmurs, rubs, gallops RESPIRATORY:  Clear to auscultation without rales, wheezing or rhonchi  ABDOMEN: Soft, non-tender, non-distended MUSCULOSKELETAL:  No edema; No deformity  SKIN: Warm and dry NEUROLOGIC:  Alert and oriented x 3 PSYCHIATRIC:  Normal affect        ASSESSMENT:    1. Persistent atrial fibrillation (HCC)   2. Typical atrial flutter (HCC)   3. Primary hypertension    PLAN:    In order of problems listed above:  #Persistent atrial fibrillation and flutter Doing well after his ablation in June.  On Xarelto for stroke prophylaxis. Okay to stop amiodarone and diltiazem.  He will keep a check on his blood pressures since he is stopping diltiazem to make sure they do not rise above a systolic of 140 mmHg.  CHA2DS2-VASc Score = 2  The patient's score is based upon: CHF History: 1 HTN History: 1 Diabetes History: 0 Stroke History: 0 Vascular Disease History: 0 Age Score: 0 Gender Score: 0     #Hypertension At goal today.  Recommend checking blood pressures 1-2 times per week at home and recording the values.  Recommend bringing these recordings to the primary care physician.  Follow-up in 1 year with APP.   Medication Adjustments/Labs and Tests Ordered: Current medicines are reviewed at length with the patient today.  Concerns regarding medicines are outlined above.   No orders of the defined types were placed in this encounter.  No orders of the defined types were placed in this  encounter.   I,Mathew Stumpf,acting as a 01/04/22 for July, MD.,have documented all relevant documentation on the behalf of Neurosurgeon, MD,as directed by  Lanier Prude, MD while in the presence of Lanier Prude, MD.  I, Lanier Prude,  MD, have reviewed all documentation for this visit. The documentation on 02/06/22 for the exam, diagnosis, procedures, and orders are all accurate and complete.   Signed, Steffanie Dunn, MD, [facility], [facility] 02/06/2022 8:14 AM    Electrophysiology [facility]

## 2022-03-22 ENCOUNTER — Other Ambulatory Visit: Payer: Self-pay | Admitting: Cardiology

## 2022-06-21 IMAGING — CT CT HEART MORPH/PULM VEIN W/ CM & W/O CA SCORE
2 of 6 series · 12 of 20 positions shown, 14 images · non-contrast
Comparison: None Available.
COMPARISON: None Available.

Addendum:
EXAM:
OVER-READ INTERPRETATION  CT CHEST

The following report is a limited chest CT over-read performed by
10/28/2021. The coronary calcium score and cardiac CTA interpretation
by the cardiologist is attached.
CLINICAL DATA: Atrial fibrillation scheduled for an ablation.
Cardiac CT/CTA
TECHNIQUE: The patient was scanned on a Siemens Force [REDACTED]ice  scanner.

[Series 8: 0-90% · axial · 0.39mm/px · z∈[+1230,+1321]mm · 6 of 2549 slices shown, 8 images]
[im 365/2549  vessel]
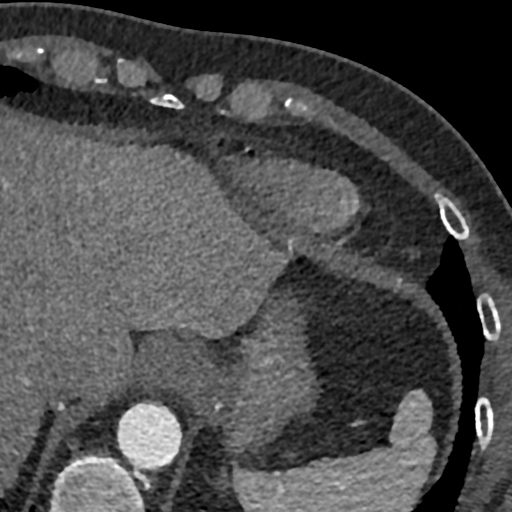
[im 365/2549  lung]
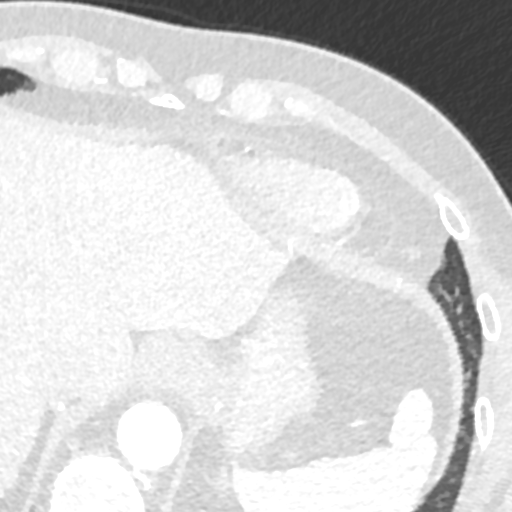
[im 729/2549  vessel]
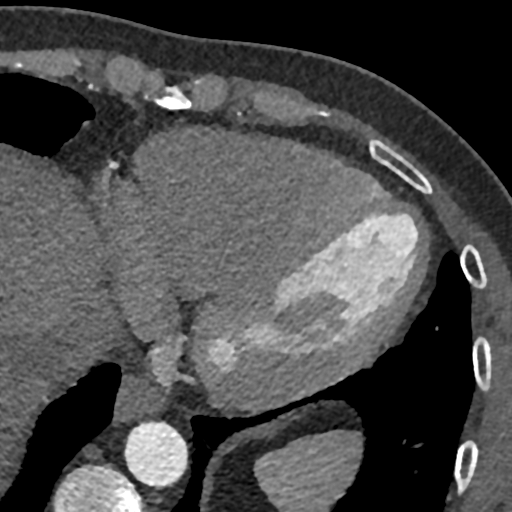
[im 1093/2549  vessel]
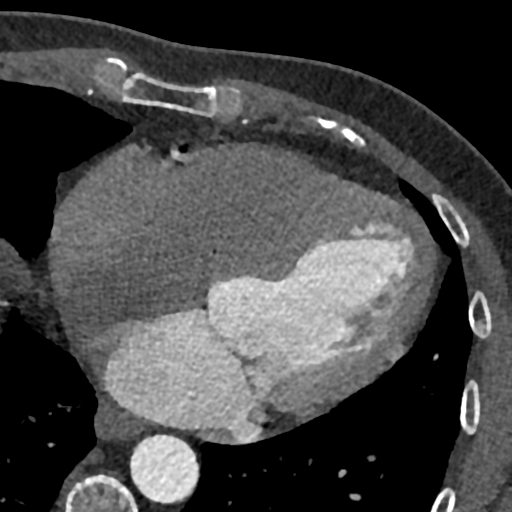
[im 1457/2549  vessel]
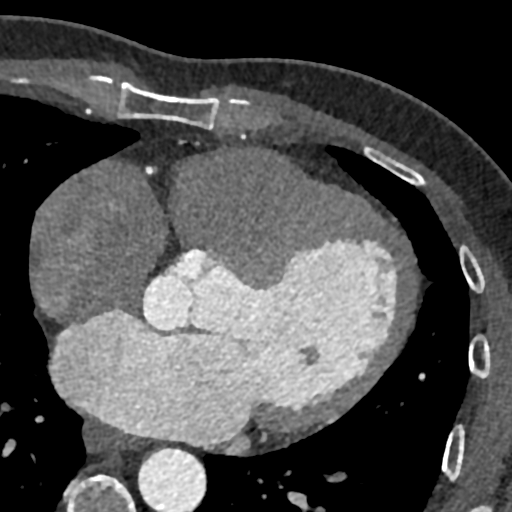
[im 1821/2549  vessel]
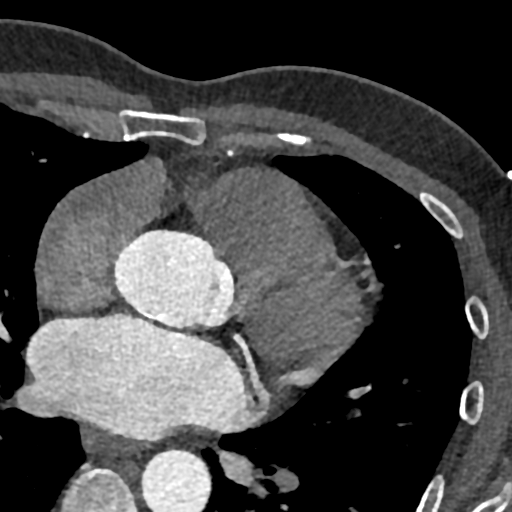
[im 1821/2549  lung]
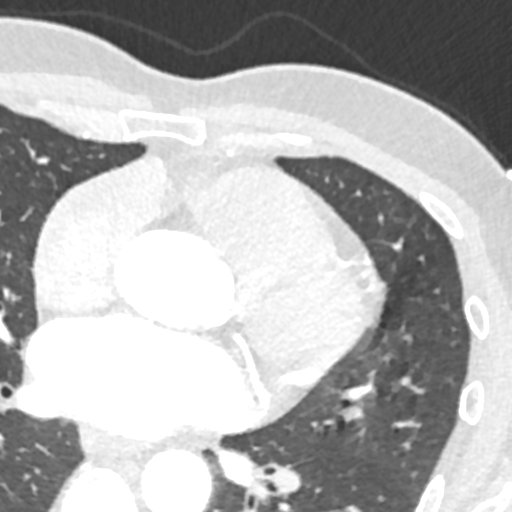
[im 2185/2549  vessel]
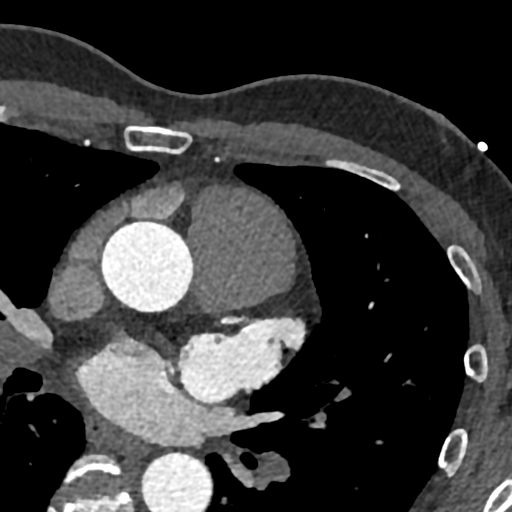

[Series 11: 5-95% · axial · 0.39mm/px · z∈[+1230,+1321]mm · 6 of 2550 slices shown]
[im 365/2550  vessel]
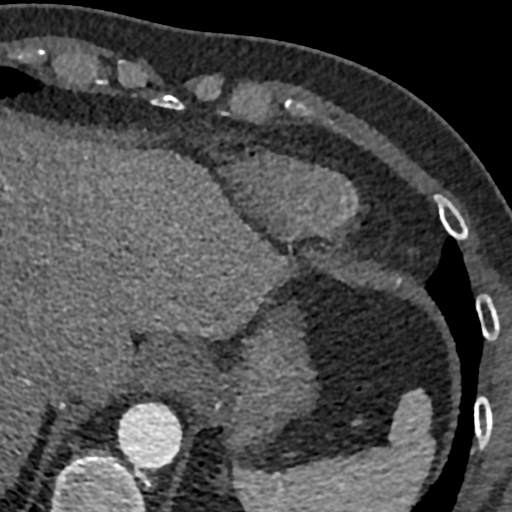
[im 729/2550  vessel]
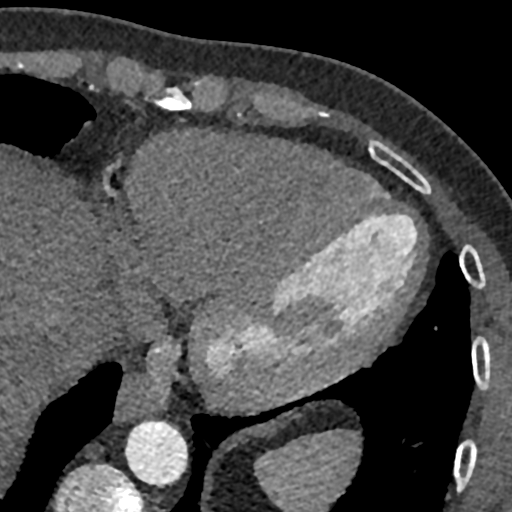
[im 1093/2550  vessel]
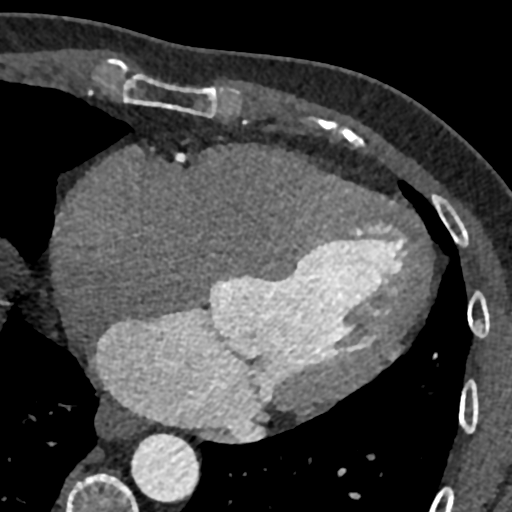
[im 1457/2550  vessel]
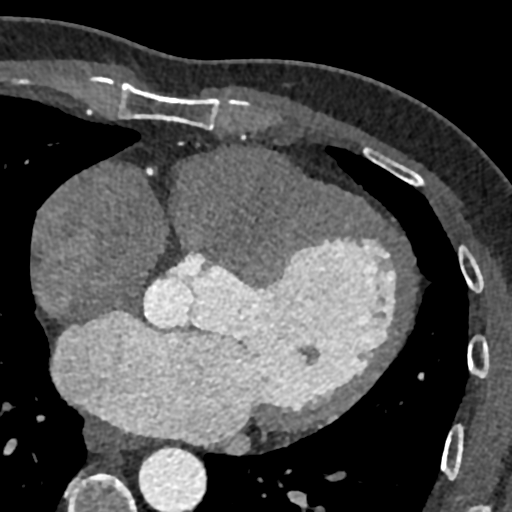
[im 1821/2550  vessel]
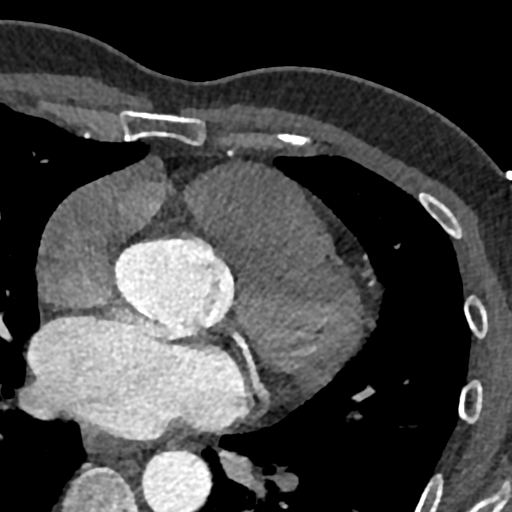
[im 2185/2550  vessel]
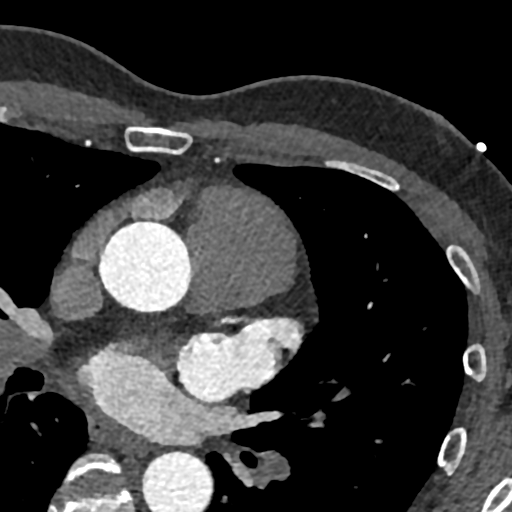

[12 of 20 positions shown; findings below may reference images not displayed]

FINDINGS: Atherosclerotic calcifications in the thoracic aorta. Within the
visualized portions of the thorax there are no suspicious appearing
pulmonary nodules or masses, there is no acute consolidative
airspace disease, no pleural effusions, no pneumothorax and no
lymphadenopathy. Visualized portions of the upper abdomen are
unremarkable. There are no aggressive appearing lytic or blastic
lesions noted in the visualized portions of the skeleton.
IMPRESSION: 1.  Aortic Atherosclerosis (KV8ER-NOL.L).
FINDINGS: A 120 kV prospective scan was triggered in the ascending thoracic
aorta at 140 HU's. Gantry rotation speed was 250 msecs and
collimation was .6 mm. No beta blockade and no NTG was given. The 3D
data set was reconstructed for best systolic and diastolic phases
along with delayed images of the ISSBEL Images analyzed on a dedicated
work station using MPR, MIP and VRT modes. The patient received 80
cc of contrast.

Moderate bi atrial enlargement No ASD/PFO No pericardial effusion
Normal ascending thoracic aorta 3.5 cm

Large Windsock ISSBEL with no thrombus Landing Zone 26.5 mm length
mm

TRINI/JAYLON: Near common ostium TRINI and middle PV ostium 22.6 mm area
cm2

RLPV:  Ostium 16.4 mm  area 3 cm2

LUPV:  Ostium 12.5 mm area 1.5 cm2

LLPV:  Ostium 20.6 mm  area 4.2 cm2

Calcium score Mild calcium noted in mid LAD
IMPRESSION: 1.  Moderate bi atrial enlargement

2. No ISSBEL thrombus Large Windsock appendage with landing zone
mm depth 43.7 mm

3.  No ASD/PFO

4.  No pericardial effusion

5.  Normal ascending thoracic aorta 3.5 cm

6.  Normal PV anatomy see measurements above

7. Calcium score 18.3 isolated to LaD which is 78 th percentile for
age/sex

Nawendra Bhardawaj

*** End of Addendum ***
EXAM:
OVER-READ INTERPRETATION  CT CHEST

The following report is a limited chest CT over-read performed by
10/28/2021. The coronary calcium score and cardiac CTA interpretation
by the cardiologist is attached.
FINDINGS: Atherosclerotic calcifications in the thoracic aorta. Within the
visualized portions of the thorax there are no suspicious appearing
pulmonary nodules or masses, there is no acute consolidative
airspace disease, no pleural effusions, no pneumothorax and no
lymphadenopathy. Visualized portions of the upper abdomen are
unremarkable. There are no aggressive appearing lytic or blastic
lesions noted in the visualized portions of the skeleton.
IMPRESSION: 1.  Aortic Atherosclerosis (KV8ER-NOL.L).

## 2022-06-26 ENCOUNTER — Other Ambulatory Visit: Payer: Self-pay | Admitting: Cardiology

## 2022-07-06 ENCOUNTER — Encounter (HOSPITAL_COMMUNITY): Payer: Self-pay | Admitting: *Deleted

## 2022-07-17 ENCOUNTER — Other Ambulatory Visit: Payer: Self-pay | Admitting: Physician Assistant

## 2022-07-17 DIAGNOSIS — I4891 Unspecified atrial fibrillation: Secondary | ICD-10-CM

## 2022-07-17 NOTE — Telephone Encounter (Signed)
Prescription refill request for Xarelto received.  Indication: a fib Last office visit: Weight: 192 Age: 47 Scr: 1.18 (11/04/21) CrCl:  94 mL/min

## 2022-08-03 ENCOUNTER — Other Ambulatory Visit: Payer: Self-pay | Admitting: Cardiology

## 2023-01-17 ENCOUNTER — Other Ambulatory Visit: Payer: Self-pay | Admitting: Cardiology

## 2023-01-17 DIAGNOSIS — I4891 Unspecified atrial fibrillation: Secondary | ICD-10-CM

## 2023-01-17 NOTE — Telephone Encounter (Signed)
Prescription refill request for Xarelto received.  Indication: Afib  Last office visit: 02/06/22 Lalla Brothers)  Weight: 87.1kg Age: 47 Scr: 1.18 (11/04/21)  CrCl:   Labs overdue. Called pt, no answer. Left message on voicemail.

## 2023-01-17 NOTE — Telephone Encounter (Signed)
Prescription refill request for Xarelto received.  Indication: AF Last office visit: 02/06/22  Walter Cooks MD Weight: 87.1kg Age: 47 Scr: 1.18 on 11/04/21  Epic CrCl: 93.29  Based on above findings Xarelto 20mg  daily is the appropriate dose.  Refill approved.  Pt is due to see MD in September.  Message sent to schedulers to make appt.  Will needs labs at that time.

## 2023-01-17 NOTE — Telephone Encounter (Signed)
Pt returned call. Labs ordered and pt states he will go to [facility] lab on 02/05/23 to have blood work completed. Refill already sent in by Vashti Hey, RN

## 2023-02-03 ENCOUNTER — Other Ambulatory Visit: Payer: Self-pay | Admitting: Cardiology

## 2023-02-05 ENCOUNTER — Telehealth: Payer: Self-pay | Admitting: Cardiology

## 2023-02-05 ENCOUNTER — Ambulatory Visit: Payer: Managed Care, Other (non HMO) | Attending: Cardiology

## 2023-02-05 ENCOUNTER — Telehealth: Payer: Self-pay

## 2023-02-05 ENCOUNTER — Other Ambulatory Visit (HOSPITAL_COMMUNITY): Payer: Self-pay

## 2023-02-05 ENCOUNTER — Other Ambulatory Visit: Payer: Self-pay

## 2023-02-05 DIAGNOSIS — I4891 Unspecified atrial fibrillation: Secondary | ICD-10-CM

## 2023-02-05 MED ORDER — RIVAROXABAN 20 MG PO TABS: 20.0000 mg | ORAL_TABLET | Freq: Every day | ORAL | 1 refills | Status: DC

## 2023-02-05 MED ORDER — RIVAROXABAN 20 MG PO TABS: 20.0000 mg | ORAL_TABLET | Freq: Every day | ORAL | 0 refills | Status: DC

## 2023-02-05 NOTE — Telephone Encounter (Signed)
Patient stopped by this AM stating he needs a refill on [place] sent to [facility]

## 2023-02-05 NOTE — Telephone Encounter (Signed)
Prescription refill request for Xarelto received.  Indication: Afib  Last office visit: 02/06/22 Walter [name])  Weight: 87.1kg Age: 47 Scr: 1.18 (11/04/21)  CrCl: 93.84ml/min  Labs overdue; however, updated labs were drawn today at 8:38am. Results pending. Refill sent.

## 2023-02-05 NOTE — Telephone Encounter (Signed)
Pharmacy Patient Advocate Encounter  Received notification from [employer] that Prior Authorization for Walter [name] has been APPROVED from 02/05/23 to 02/05/24. Ran test claim, Copay is $70.83. This test claim was processed through [facility]- copay amounts may vary at other pharmacies due to pharmacy/plan contracts, or as the patient moves through the different stages of their insurance plan.

## 2023-02-05 NOTE — Telephone Encounter (Signed)
Pharmacy Patient Advocate Encounter   Received notification from CoverMyMeds that prior authorization for XARELTO is required/requested.   Insurance verification completed.   The patient is insured through [employer] .   Per test claim: PA required; PA submitted to [employer] via CoverMyMeds Key/confirmation #/EOC [id number] Status is pending

## 2023-02-05 NOTE — Telephone Encounter (Signed)
Prescription refill request for Xarelto received.  Indication:afib Last office visit:9/23 Weight:87.1  kg Age:47 Scr:1.18 CrCl:93.29  ml/min  Prescription refilled

## 2023-02-06 LAB — BASIC METABOLIC PANEL
BUN/Creatinine Ratio: 14 (ref 9–20)
BUN: 13 mg/dL (ref 6–24)
CO2: 22 mmol/L (ref 20–29)
Calcium: 9.3 mg/dL (ref 8.7–10.2)
Chloride: 103 mmol/L (ref 96–106)
Creatinine, Ser: 0.94 mg/dL (ref 0.76–1.27)
Glucose: 96 mg/dL (ref 70–99)
Potassium: 4.1 mmol/L (ref 3.5–5.2)
Sodium: 137 mmol/L (ref 134–144)
eGFR: 99 mL/min/{1.73_m2} (ref >59)

## 2023-02-06 LAB — CBC
Hematocrit: 39.2 % (ref 37.5–51.0)
Hemoglobin: 12.8 g/dL — ABNORMAL LOW (ref 13.0–17.7)
MCH: 30.2 pg (ref 26.6–33.0)
MCHC: 32.7 g/dL (ref 31.5–35.7)
MCV: 93 fL (ref 79–97)
Platelets: 321 10*3/uL (ref 150–450)
RBC: 4.24 x10E6/uL (ref 4.14–5.80)
RDW: 12.2 % (ref 11.6–15.4)
WBC: 5 10*3/uL (ref 3.4–10.8)

## 2023-02-07 ENCOUNTER — Other Ambulatory Visit: Payer: Self-pay | Admitting: Cardiology

## 2023-03-05 ENCOUNTER — Other Ambulatory Visit: Payer: Self-pay | Admitting: Cardiology

## 2023-03-10 ENCOUNTER — Other Ambulatory Visit: Payer: Self-pay | Admitting: Cardiology

## 2023-03-26 ENCOUNTER — Other Ambulatory Visit: Payer: Self-pay | Admitting: Cardiology

## 2023-04-05 NOTE — Progress Notes (Signed)
Cardiology Office Note:  .   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  [facility] Providers Cardiologist:  Donato Schultz, MD Electrophysiologist:  Lanier Prude, MD {  History of Present Illness: .   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 Lalla Brothers 02/06/22, reported some palpitations, perhaps more acutely aware of his heart beat, doing well, amio and dilt stopped.  Advised to monitor his BP, Xarelto continued with a score of 2.   Today's visit is scheduled as an annual visit  ROS:   He is doing very well Historically loved to run, play soccer though post hip surgery stays with cycling ([employer]), does this regularly with good exertional capacity He does not think he has had any AF post ablation, does think that he would be aware of he did  No CP, SOB, DOE No near syncope or syncope No bleeding or signs of bleeding   Arrhythmia/AAD hx PVI/CTI ablation 11/04/21  Studies Reviewed: Marland Kitchen    EKG done today and reviewed by myself:  SB 50bpm   11/04/21: EPS/ablation CONCLUSIONS: 1. Successful PVI 2. Successful ablation/isolation of the posterior wall 3. Successful ablation of the cavotricuspid isthmus for typical atrial flutter 4. Intracardiac echo reveals dilated LA, left sided common PV ostium and a trivial pericardial effusion 5. No early apparent complications. 6. Colchicine 0.6mg  PO BID x 5 days 7. Protonix 40mg  PO daily x 45 days   10/28/21: Cardiac CT IMPRESSION: 1.  Moderate bi atrial enlargement 2. No LAA thrombus Large Windsock appendage with landing zone 26.5 mm depth 43.7 mm 3.  No ASD/PFO 4.  No pericardial effusion 5.  Normal ascending thoracic aorta 3.5 cm 6.  Normal PV anatomy see measurements above 7. Calcium score 18.3 isolated to LaD which is 78 th percentile for age/sex   09/07/21: TEE  1. Left ventricular ejection  fraction, by estimation, is 45 to 50%. The  left ventricle has mildly decreased function.   2. Right ventricular systolic function is mildly reduced. The right  ventricular size is normal.   3. Left atrial size was moderately dilated. No left atrial/left atrial  appendage thrombus was detected. The LAA emptying velocity was 40 cm/s.   4. The mitral valve is normal in structure. Trivial mitral valve  regurgitation. No evidence of mitral stenosis.   5. The aortic valve is tricuspid. Aortic valve regurgitation is trivial.  No aortic stenosis is present.   6. Aortic dilatation noted. There is mild dilatation of the aortic root,  measuring 42 mm.   7. Following TEE, the patient underwent DCCV with 150J x1 with return to  NSR.   Risk Assessment/Calculations:    Physical Exam:   VS:  There were no vitals taken for this visit.   Wt Readings from Last 3 Encounters:  02/06/22 192 lb (87.1 kg)  12/01/21 188 lb 9.6 oz (85.5 kg)  11/04/21 180 lb (81.6 kg)    GEN: Well nourished, well developed in no acute distress NECK: No JVD; No carotid bruits CARDIAC: RRR, (bradycardic) no murmurs, rubs, gallops RESPIRATORY:  CTA b/l without rales, wheezing or rhonchi  ABDOMEN: Soft, non-tender, non-distended EXTREMITIES:  No edema; No deformity    ASSESSMENT AND PLAN: .    persistent AFib Atrial flutter (typical) CHA2DS2Vasc is 2, on Xarelto, approrpiately dosed no burden by symptoms post ablation  NICM  Mild reduced EF no clinical HF  Update his echo now a year or so post ablation On BB/ARB C/w Dr. Lalla Brothers  HTN A bit elevated Advised to monitor and f/u with his PMD  Secondary hypercoagulable state 2/2 AFib    Dispo: annually with EP again, sooner if needed  Signed, Sheilah Pigeon, PA-C

## 2023-04-06 ENCOUNTER — Ambulatory Visit: Payer: Managed Care, Other (non HMO) | Attending: Physician Assistant | Admitting: Physician Assistant

## 2023-04-06 ENCOUNTER — Encounter: Payer: Self-pay | Admitting: Physician Assistant

## 2023-04-06 VITALS — BP 136/84 | HR 50 | Ht 71.0 in | Wt 198.6 lb

## 2023-04-06 DIAGNOSIS — I428 Other cardiomyopathies: Secondary | ICD-10-CM

## 2023-04-06 DIAGNOSIS — I10 Essential (primary) hypertension: Secondary | ICD-10-CM

## 2023-04-06 DIAGNOSIS — I4819 Other persistent atrial fibrillation: Secondary | ICD-10-CM | POA: Diagnosis not present

## 2023-04-06 DIAGNOSIS — D6869 Other thrombophilia: Secondary | ICD-10-CM

## 2023-04-06 DIAGNOSIS — I483 Typical atrial flutter: Secondary | ICD-10-CM

## 2023-04-06 NOTE — Patient Instructions (Addendum)
Medication Instructions:  Your physician recommends that you continue on your current medications as directed. Please refer to the Current Medication list given to you today.  *If you need a refill on your cardiac medications before your next appointment, please call your pharmacy*   Lab Work: None ordered   Testing/Procedures: Your physician has requested that you have an echocardiogram. Echocardiography is a painless test that uses sound waves to create images of your heart. It provides your doctor with information about the size and shape of your heart and how well your heart's chambers and valves are working. This procedure takes approximately one hour. There are no restrictions for this procedure. Please do NOT wear cologne, perfume, aftershave, or lotions (deodorant is allowed). Please arrive 15 minutes prior to your appointment time.  Please note: We ask at that you not bring children with you during ultrasound (echo/ vascular) testing. Due to room size and safety concerns, children are not allowed in the ultrasound rooms during exams. Our front office staff cannot provide observation of children in our lobby area while testing is being conducted. An adult accompanying a patient to their appointment will only be allowed in the ultrasound room at the discretion of the ultrasound technician under special circumstances. We apologize for any inconvenience.   Follow-Up: At [facility], you and your health needs are our priority.  As part of our continuing mission to provide you with exceptional heart care, we have created designated Provider Care Teams.  These Care Teams include your primary Cardiologist (physician) and Advanced Practice Providers (APPs -  Physician Assistants and Nurse Practitioners) who all work together to provide you with the care you need, when you need it.  We recommend signing up for the patient portal called "MyChart".  Sign up information is provided on this  After Visit Summary.  MyChart is used to connect with patients for Virtual Visits (Telemedicine).  Patients are able to view lab/test results, encounter notes, upcoming appointments, etc.  Non-urgent messages can be sent to your provider as well.   To learn more about what you can do with MyChart, go to [email or web address].    Your next appointment:   12 month(s)  Provider:   Loman Brooklyn, MD or Francis Dowse, PA-C    Other Instructions

## 2023-05-03 ENCOUNTER — Other Ambulatory Visit: Payer: Self-pay | Admitting: Cardiology

## 2023-05-09 ENCOUNTER — Ambulatory Visit (HOSPITAL_COMMUNITY): Payer: Commercial Managed Care - HMO | Attending: Cardiology

## 2023-05-09 DIAGNOSIS — I428 Other cardiomyopathies: Secondary | ICD-10-CM | POA: Diagnosis not present

## 2023-05-09 DIAGNOSIS — I483 Typical atrial flutter: Secondary | ICD-10-CM | POA: Diagnosis not present

## 2023-05-09 DIAGNOSIS — I4819 Other persistent atrial fibrillation: Secondary | ICD-10-CM | POA: Diagnosis present

## 2023-05-09 LAB — ECHOCARDIOGRAM COMPLETE
Area-P 1/2: 2.15 cm2
Est EF: 50
P 1/2 time: 668 ms
S' Lateral: 3.4 cm

## 2023-05-25 ENCOUNTER — Other Ambulatory Visit: Payer: Self-pay

## 2023-05-25 MED ORDER — TELMISARTAN 20 MG PO TABS: 20.0000 mg | ORAL_TABLET | Freq: Every day | ORAL | 3 refills | Status: DC

## 2023-06-16 ENCOUNTER — Other Ambulatory Visit: Payer: Self-pay | Admitting: Cardiology

## 2023-06-18 ENCOUNTER — Other Ambulatory Visit: Payer: Self-pay

## 2023-06-18 MED ORDER — METOPROLOL SUCCINATE ER 50 MG PO TB24: ORAL_TABLET | ORAL | 1 refills | Status: DC

## 2023-07-23 ENCOUNTER — Encounter: Payer: Self-pay | Admitting: *Deleted

## 2023-07-24 ENCOUNTER — Encounter: Payer: Self-pay | Admitting: Cardiology

## 2023-07-24 ENCOUNTER — Ambulatory Visit: Payer: Commercial Managed Care - HMO | Attending: Cardiology | Admitting: Cardiology

## 2023-07-24 VITALS — BP 120/84 | HR 52 | Ht 71.0 in | Wt 189.4 lb

## 2023-07-24 DIAGNOSIS — I10 Essential (primary) hypertension: Secondary | ICD-10-CM | POA: Diagnosis not present

## 2023-07-24 DIAGNOSIS — I4819 Other persistent atrial fibrillation: Secondary | ICD-10-CM | POA: Diagnosis not present

## 2023-07-24 DIAGNOSIS — Z79899 Other long term (current) drug therapy: Secondary | ICD-10-CM

## 2023-07-24 NOTE — Progress Notes (Signed)
 Cardiology Office Note:  .   Date:  07/24/2023  ID:  Walter Riggs, MD, DOB [birth year], MRN [id number] PCP: Patient, No Pcp Per  [facility] Providers Cardiologist:  Donato Schultz, MD Electrophysiologist:  Lanier Prude, MD     History of Present Illness: .   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   He has paroxysmal atrial fibrillation and flutter, having undergone a PVI/CTI ablation on November 04, 2021. He experiences occasional palpitations and a sensation of flutter, which may be post-ablation phenomena. His EKG previously showed sinus bradycardia at 50 beats per minute. A cardiac CT on October 28, 2021, revealed moderate biatrial enlargement, a normal ascending aorta, and a calcium score of 18 associated with the LAD, placing him in the 78th percentile. He is on Xarelto with a CHADS VASc score of two. No chest pain is reported, and he feels well overall.  He has a history of dilated cardiomyopathy with a left ventricular ejection fraction of 45-50% as per the transesophageal echocardiogram on September 07, 2021. He is currently asymptomatic with no chest pain and is on Toprol XL 50 mg daily and telmisartan 20 mg daily. His blood pressure is 120/84 mmHg.  Hypertension is well-controlled with telmisartan 20 mg daily. Blood pressure is 120/84 mmHg.  Hypothyroidism is under good control with Synthroid. He is off amiodarone and diltiazem.  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 He mentions having an ultrasound after his last visit with L[facility] Landsberg, where the technician noted some PVCs. He felt a flutter a couple of weeks ago but was not symptomatic and is monitoring for recurrence. No family history of cardiac disease.   Working in  tele-pathology          ROS: No CP, no SOB  Studies Reviewed: .        Results   LABS Hb: 11.6 Cr: 0.9 K: 4.1 ALT: 14  RADIOLOGY Cardiac CT: moderate biatrial enlargement, normal ascending aorta, calcium score of 18 associated with LAD, 78th percentile (10/28/2021)  DIAGNOSTIC EKG: sinus bradycardia 50 bpm Transesophageal echocardiogram: EF 45-50% (09/07/2021) Echocardiogram: EF 50%, moderate left atrial dilation, mild right atrial dilation, aortic root 38 mm (05/08/2021)     Risk Assessment/Calculations:            Physical Exam:   VS:  BP 120/84   Pulse (!) 52   Ht 5\' 11"  (1.803 m)   Wt 189 lb 6.4 oz (85.9 kg)   SpO2 99%   BMI 26.42 kg/m    Wt Readings from Last 3 Encounters:  07/24/23 189 lb 6.4 oz (85.9 kg)  04/06/23 198 lb 9.6 oz (90.1 kg)  02/06/22 192 lb (87.1 kg)    GEN: Well nourished, well developed in no acute distress NECK: No JVD; No carotid bruits CARDIAC: RRR, no murmurs, no rubs, no gallops RESPIRATORY:  Clear to auscultation without rales, wheezing or rhonchi  ABDOMEN: Soft, non-tender, non-distended EXTREMITIES:  No edema; No deformity   ASSESSMENT AND PLAN: .    Assessment and Plan    Paroxysmal Atrial Fibrillation and Flutter Paroxysmal atrial fibrillation and flutter, post-PVI/CTI ablation on 11/04/2021. Reports occasional palpitations, otherwise asymptomatic. EKG showed sinus bradycardia at 50 bpm. Currently on Xarelto with a  CHADS VASc score of 2. Discussed Zeo monitors for symptom exacerbation. - Continue Xarelto - Monitor for atrial fibrillation or flutter symptoms - Consider Zeo monitor if symptoms worsen  Dilated Cardiomyopathy Dilated cardiomyopathy with EF of 45-50% on transesophageal echocardiogram (09/07/2021). On Toprol XL 50 mg daily and telmisartan 20 mg daily. No chest pain, asymptomatic. - Continue Toprol XL 50 mg daily - Continue telmisartan 20 mg daily  Hypertension Well-controlled hypertension with BP of 120/84  mmHg on telmisartan 20 mg daily. - Continue telmisartan 20 mg daily - Monitor blood pressure regularly  Hypothyroidism Hypothyroidism managed with Synthroid. Thyroid levels well-controlled. - Continue Synthroid - Follow up with endocrinology/PCP as needed  General Health Maintenance Generally well, active, exercises regularly, balanced lifestyle. - Order CBC, complete metabolic profile, and lipid panel  Follow-up - Schedule annual follow-up - Perform lab work at [facility].              Signed, Donato Schultz, MD

## 2023-07-24 NOTE — Patient Instructions (Signed)
 Medication Instructions:  The current medical regimen is effective;  continue present plan and medications.  *If you need a refill on your cardiac medications before your next appointment, please call your pharmacy*   Lab Work: Please have blood work today (CBC, CMP and Lipid)  If you have labs (blood work) drawn today and your tests are completely normal, you will receive your results only by: MyChart Message (if you have MyChart) OR A paper copy in the mail If you have any lab test that is abnormal or we need to change your treatment, we will call you to review the results.  Follow-Up: At [facility], you and your health needs are our priority.  As part of our continuing mission to provide you with exceptional heart care, we have created designated Provider Care Teams.  These Care Teams include your primary Cardiologist (physician) and Advanced Practice Providers (APPs -  Physician Assistants and Nurse Practitioners) who all work together to provide you with the care you need, when you need it.  We recommend signing up for the patient portal called "MyChart".  Sign up information is provided on this After Visit Summary.  MyChart is used to connect with patients for Virtual Visits (Telemedicine).  Patients are able to view lab/test results, encounter notes, upcoming appointments, etc.  Non-urgent messages can be sent to your provider as well.   To learn more about what you can do with MyChart, go to [email or web address].    Your next appointment:   1 year(s)  Provider:   Donato Schultz, MD        1st Floor: - Lobby - Registration  - Pharmacy  - Lab - Cafe  2nd Floor: - PV Lab - Diagnostic Testing (echo, CT, nuclear med)  3rd Floor: - Vacant  4th Floor: - TCTS (cardiothoracic surgery) - AFib Clinic - Structural Heart Clinic - Vascular Surgery  - Vascular Ultrasound  5th Floor: - [facility] Cardiology (general and EP) - Clinical Pharmacy for coumadin,  hypertension, lipid, weight-loss medications, and med management appointments    Valet parking services will be available as well.

## 2023-07-27 ENCOUNTER — Other Ambulatory Visit: Payer: Self-pay | Admitting: Cardiology

## 2023-10-26 LAB — LIPID PANEL

## 2023-10-27 LAB — CBC
Hematocrit: 40.6 % (ref 37.5–51.0)
Hemoglobin: 13.4 g/dL (ref 13.0–17.7)
MCH: 29.9 pg (ref 26.6–33.0)
MCHC: 33 g/dL (ref 31.5–35.7)
MCV: 91 fL (ref 79–97)
Platelets: 333 10*3/uL (ref 150–450)
RBC: 4.48 x10E6/uL (ref 4.14–5.80)
RDW: 12.7 % (ref 11.6–15.4)
WBC: 6.8 10*3/uL (ref 3.4–10.8)

## 2023-10-27 LAB — COMPREHENSIVE METABOLIC PANEL WITH GFR
ALT: 19 IU/L (ref 0–44)
AST: 25 IU/L (ref 0–40)
Albumin: 4.7 g/dL (ref 4.1–5.1)
Alkaline Phosphatase: 78 IU/L (ref 44–121)
BUN/Creatinine Ratio: 16 (ref 9–20)
BUN: 15 mg/dL (ref 6–24)
Bilirubin Total: 0.4 mg/dL (ref 0.0–1.2)
CO2: 21 mmol/L (ref 20–29)
Calcium: 9.7 mg/dL (ref 8.7–10.2)
Chloride: 101 mmol/L (ref 96–106)
Creatinine, Ser: 0.96 mg/dL (ref 0.76–1.27)
Globulin, Total: 2.2 g/dL (ref 1.5–4.5)
Glucose: 80 mg/dL (ref 70–99)
Potassium: 4.7 mmol/L (ref 3.5–5.2)
Sodium: 138 mmol/L (ref 134–144)
Total Protein: 6.9 g/dL (ref 6.0–8.5)
eGFR: 96 mL/min/{1.73_m2} (ref >59)

## 2023-10-27 LAB — LIPID PANEL
Chol/HDL Ratio: 3.8 ratio (ref 0.0–5.0)
Cholesterol, Total: 177 mg/dL (ref 100–199)
HDL: 47 mg/dL (ref >39)
LDL Chol Calc (NIH): 113 mg/dL — ABNORMAL HIGH (ref 0–99)
Triglycerides: 94 mg/dL (ref 0–149)
VLDL Cholesterol Cal: 17 mg/dL (ref 5–40)

## 2023-10-30 ENCOUNTER — Ambulatory Visit: Payer: Self-pay | Admitting: Cardiology

## 2023-10-30 DIAGNOSIS — E785 Hyperlipidemia, unspecified: Secondary | ICD-10-CM

## 2023-10-30 DIAGNOSIS — Z79899 Other long term (current) drug therapy: Secondary | ICD-10-CM

## 2023-10-31 ENCOUNTER — Other Ambulatory Visit: Payer: Self-pay | Admitting: *Deleted

## 2023-10-31 DIAGNOSIS — Z79899 Other long term (current) drug therapy: Secondary | ICD-10-CM

## 2023-10-31 DIAGNOSIS — E785 Hyperlipidemia, unspecified: Secondary | ICD-10-CM

## 2023-10-31 MED ORDER — ROSUVASTATIN CALCIUM 5 MG PO TABS: 5.0000 mg | ORAL_TABLET | Freq: Every day | ORAL | 3 refills | Status: AC

## 2024-01-04 ENCOUNTER — Ambulatory Visit: Payer: Self-pay

## 2024-01-04 LAB — LIPID PANEL
Chol/HDL Ratio: 2.8 ratio (ref 0.0–5.0)
Cholesterol, Total: 125 mg/dL (ref 100–199)
HDL: 45 mg/dL (ref >39)
LDL Chol Calc (NIH): 69 mg/dL (ref 0–99)
Triglycerides: 49 mg/dL (ref 0–149)
VLDL Cholesterol Cal: 11 mg/dL (ref 5–40)

## 2024-02-06 ENCOUNTER — Other Ambulatory Visit: Payer: Self-pay | Admitting: Cardiology

## 2024-02-06 DIAGNOSIS — I4891 Unspecified atrial fibrillation: Secondary | ICD-10-CM

## 2024-02-06 NOTE — Telephone Encounter (Signed)
 Prescription refill request for Xarelto  received.  Indication:afib Last office visit:2/25 Weight:85.9  kg Age:48 Scr:0.96  5/25 CrCl:111.85  ml/min  Prescription refilled

## 2024-05-17 ENCOUNTER — Other Ambulatory Visit: Payer: Self-pay | Admitting: Cardiology

## 2024-06-08 ENCOUNTER — Other Ambulatory Visit: Payer: Self-pay | Admitting: Physician Assistant

## 2024-06-09 ENCOUNTER — Telehealth: Payer: Self-pay | Admitting: Physician Assistant

## 2024-06-09 NOTE — Telephone Encounter (Signed)
" °*  STAT* If patient is at the pharmacy, call can be transferred to refill team.   1. Which medications need to be refilled? (please list name of each medication and dose if known)   telmisartan  (MICARDIS ) 20 MG tablet    2. Would you like to learn more about the convenience, safety, & potential cost savings by using the [facility]? no   3. Are you open to using the [facility] (Type [facility]. no   4. Which pharmacy/location (including street and city if local pharmacy) is medication to be sent to?  [id number] - , [place] - [address] AT [facility] [address]     5. Do they need a 30 day or 90 day supply? 90 day  Pt is completely out.   "

## 2024-06-10 NOTE — Telephone Encounter (Signed)
Refill has already been done.

## 2024-07-24 ENCOUNTER — Ambulatory Visit: Admitting: Cardiology

## 2024-07-31 ENCOUNTER — Ambulatory Visit: Admitting: Physician Assistant
